# Patient Record
Sex: Female | Born: 1950 | Hispanic: Yes | Marital: Single | Smoking: Never smoker
Health system: Southern US, Community
[De-identification: ages and names within clinical notes are randomized; demographics above are authoritative.]

## PROBLEM LIST (undated history)

## (undated) DIAGNOSIS — I10 Essential (primary) hypertension: Secondary | ICD-10-CM

## (undated) DIAGNOSIS — K760 Fatty (change of) liver, not elsewhere classified: Secondary | ICD-10-CM

## (undated) DIAGNOSIS — Z853 Personal history of malignant neoplasm of breast: Secondary | ICD-10-CM

## (undated) DIAGNOSIS — K255 Chronic or unspecified gastric ulcer with perforation: Secondary | ICD-10-CM

## (undated) DIAGNOSIS — T8859XA Other complications of anesthesia, initial encounter: Secondary | ICD-10-CM

## (undated) DIAGNOSIS — Z8719 Personal history of other diseases of the digestive system: Secondary | ICD-10-CM

## (undated) DIAGNOSIS — E785 Hyperlipidemia, unspecified: Secondary | ICD-10-CM

## (undated) DIAGNOSIS — K5720 Diverticulitis of large intestine with perforation and abscess without bleeding: Secondary | ICD-10-CM

## (undated) DIAGNOSIS — K219 Gastro-esophageal reflux disease without esophagitis: Secondary | ICD-10-CM

## (undated) HISTORY — PX: MASS EXCISION: SHX2000

## (undated) HISTORY — DX: Personal history of malignant neoplasm of breast: Z85.3

## (undated) HISTORY — DX: Fatty (change of) liver, not elsewhere classified: K76.0

## (undated) HISTORY — PX: HUMERUS FRACTURE SURGERY: SHX670

## (undated) HISTORY — DX: Personal history of other diseases of the digestive system: Z87.19

## (undated) HISTORY — PX: OTHER SURGICAL HISTORY: SHX169

## (undated) HISTORY — DX: Diverticulitis of large intestine with perforation and abscess without bleeding: K57.20

## (undated) HISTORY — DX: Essential (primary) hypertension: I10

## (undated) HISTORY — DX: Hyperlipidemia, unspecified: E78.5

## (undated) HISTORY — DX: Chronic or unspecified gastric ulcer with perforation: K25.5

## (undated) HISTORY — PX: BREAST EXCISIONAL BIOPSY: SUR124

---

## 2008-10-10 DIAGNOSIS — C801 Malignant (primary) neoplasm, unspecified: Secondary | ICD-10-CM

## 2008-10-10 HISTORY — DX: Malignant (primary) neoplasm, unspecified: C80.1

## 2017-10-17 ENCOUNTER — Ambulatory Visit: Payer: Self-pay | Attending: Nurse Practitioner | Admitting: Nurse Practitioner

## 2017-10-17 ENCOUNTER — Encounter: Payer: Self-pay | Admitting: Nurse Practitioner

## 2017-10-17 VITALS — BP 136/79 | HR 77 | Temp 98.1°F | Ht 59.45 in | Wt 159.2 lb

## 2017-10-17 DIAGNOSIS — Z833 Family history of diabetes mellitus: Secondary | ICD-10-CM | POA: Insufficient documentation

## 2017-10-17 DIAGNOSIS — Z13820 Encounter for screening for osteoporosis: Secondary | ICD-10-CM

## 2017-10-17 DIAGNOSIS — E669 Obesity, unspecified: Secondary | ICD-10-CM | POA: Insufficient documentation

## 2017-10-17 DIAGNOSIS — I10 Essential (primary) hypertension: Secondary | ICD-10-CM | POA: Insufficient documentation

## 2017-10-17 DIAGNOSIS — Z6831 Body mass index (BMI) 31.0-31.9, adult: Secondary | ICD-10-CM | POA: Insufficient documentation

## 2017-10-17 DIAGNOSIS — Z79899 Other long term (current) drug therapy: Secondary | ICD-10-CM | POA: Insufficient documentation

## 2017-10-17 DIAGNOSIS — Z1211 Encounter for screening for malignant neoplasm of colon: Secondary | ICD-10-CM

## 2017-10-17 MED ORDER — LOSARTAN POTASSIUM 50 MG PO TABS: 50.0000 mg | ORAL_TABLET | Freq: Every day | ORAL | 1 refills | Status: DC

## 2017-10-17 MED FILL — LOSARTAN POTASSIUM 50 MG TA: 50 | 30 days supply | Qty: 30 | Fill #0

## 2017-10-17 NOTE — Progress Notes (Signed)
Assessment & Plan:  Maria Dominguez was seen today for new patient (initial visit).  Diagnoses and all orders for this visit:  Essential hypertension -     Basic metabolic panel -     CBC -     Lipid panel -     VITAMIN D 25 Hydroxy (Vit-D Deficiency, Fractures) -     losartan (COZAAR) 50 MG tablet; Take 1 tablet (50 mg total) by mouth daily. Continue all antihypertensives as prescribed.  Remember to bring in your blood pressure log with you for your follow up appointment.  DASH/Mediterranean Diets are healthier choices for HTN.   Colon cancer screening -     Ambulatory referral to Gastroenterology  Encounter for screening for osteoporosis -     DG Bone Density; Future  Obesity (BMI 30-39.9) Discussed diet and exercise for person with BMI >25. Instructed: You must burn more calories than you eat. Losing 5 percent of your body weight should be considered a success. In the longer term, losing more than 15 percent of your body weight and staying at this weight is an extremely good result. However, keep in mind that even losing 5 percent of your body weight leads to important health benefits, so try not to get discouraged if you're not able to lose more than this. Will recheck weight in 3-6 months.  Patient has been counseled on age-appropriate routine health concerns for screening and prevention. These are reviewed and up-to-date. Referrals have been placed accordingly. Immunizations are up-to-date or declined.    Subjective:   Chief Complaint  Patient presents with  . New Patient (Initial Visit)    Patient is here for hypertension. Patient stated she uesd to take Losartan in [place].   HPI  VRI was used to communicate directly with patient for the entire encounter including providing detailed patient instructions.  Maria Dominguez 67 y.o. female presents to office today to establish care as a new patient. She does not have much by the way of health history aside from hypertension.  She  plans to return to [place] in March and will be there for several months. She receives some of her health care in [place] as well. She declined labs today due to costs. She would like to speak to the financial services counselor prior to having labs drawn.    Essential Hypertension Blood pressure is well controlled on cozaar. She endorses medication compliance. She checks her blood pressure at home every 3-4 days. On average readings: 130/80s. She endorses drinking "lots of water and avoids excess salt and sugar in my diet.". Denies chest pain, shortness of breath, palpitations, lightheadedness, dizziness, headaches or BLE edema.    Review of Systems  Constitutional: Negative for fever, malaise/fatigue and weight loss.  HENT: Negative.  Negative for nosebleeds.   Eyes: Negative.  Negative for blurred vision, double vision and photophobia.  Respiratory: Negative.  Negative for cough and shortness of breath.   Cardiovascular: Negative.  Negative for chest pain, palpitations and leg swelling.  Gastrointestinal: Negative.  Negative for abdominal pain, constipation, diarrhea, heartburn, nausea and vomiting.  Musculoskeletal: Negative.  Negative for myalgias.  Neurological: Negative.  Negative for dizziness, focal weakness, seizures and headaches.  Endo/Heme/Allergies: Negative for environmental allergies.  Psychiatric/Behavioral: Negative.  Negative for suicidal ideas.    Past Medical History:  Diagnosis Date  . Hypertension     Past Surgical History:  Procedure Laterality Date  . HUMERUS FRACTURE SURGERY    . MASS EXCISION Left    left  axilla    Family History  Problem Relation Age of Onset  . Diabetes Mother     Social History Reviewed with no changes to be made today.   Outpatient Medications Prior to Visit  Medication Sig Dispense Refill  . losartan (COZAAR) 50 MG tablet Take 50 mg by mouth daily.     No facility-administered medications prior to visit.     Not on  File     Objective:    BP 136/79 (BP Location: Right Arm, Patient Position: Sitting, Cuff Size: Normal)   Pulse 77   Temp 98.1 F (36.7 C) (Oral)   Ht 4' 11.45" (1.51 m)   Wt 159 lb 3.2 oz (72.2 kg)   SpO2 96%   BMI 31.67 kg/m  Wt Readings from Last 3 Encounters:  10/17/17 159 lb 3.2 oz (72.2 kg)    Physical Exam  Constitutional: She is oriented to person, place, and time. She appears well-developed and well-nourished. She is cooperative.  HENT:  Head: Normocephalic and atraumatic.  Eyes: EOM are normal.  Neck: Normal range of motion.  Cardiovascular: Normal rate, regular rhythm, normal heart sounds and intact distal pulses. Exam reveals no gallop and no friction rub.  No murmur heard. Pulmonary/Chest: Effort normal and breath sounds normal. No tachypnea. No respiratory distress. She has no decreased breath sounds. She has no wheezes. She has no rhonchi. She has no rales. She exhibits no tenderness.  Abdominal: Soft. Bowel sounds are normal.  Musculoskeletal: Normal range of motion. She exhibits no edema.  Neurological: She is alert and oriented to person, place, and time. Coordination normal.  Skin: Skin is warm and dry.  Psychiatric: She has a normal mood and affect. Her behavior is normal. Judgment and thought content normal.  Nursing note and vitals reviewed.     Patient has been counseled extensively about nutrition and exercise as well as the importance of adherence with medications and regular follow-up. The patient was given clear instructions to go to ER or return to medical center if symptoms don't improve, worsen or new problems develop. The patient verbalized understanding.   Follow-up: Return in about 6 months (around 04/16/2018) for Needs appointment with financial representative.[name], FNP-BC [facility] [place], [place]   10/17/2017, 5:44 PM

## 2017-10-17 NOTE — Patient Instructions (Addendum)
Plan de alimentacin DASH DASH Eating Plan DASH es la sigla en ingls de "Enfoques Alimentarios para Detener la Hipertensin" (Dietary Approaches to Stop Hypertension). El plan de alimentacin DASH ha demostrado bajar la presin arterial elevada (hipertensin). Tambin puede reducir [employer] de diabetes tipo 2, enfermedad cardaca y accidente cerebrovascular. Este plan tambin puede ayudar a Horticulturist, commercial. Consejos para seguir este plan [place]autas generales  Evite ingerir ms de 2,300 mg (miligramos) de sal (sodio) por da. Si tiene hipertensin, es posible que necesite reducir la ingesta de sodio a 1,500 mg por da.  Limite el consumo de alcohol a no ms de 53mdida por da si es mujer y no est [place] y 259midas por da si es hombre. Una medida equivale a 12oz (35552mde cerveza, 5oz (148m27me vino o 1oz (44ml43m bebidas alcohlicas de alta graduacin.  Trabaje con su mdico para mantener un peso saludable o perder peso.[place] Mediagntele cul es el peso recomendado para usted.  Realice al menos 30 minutos de ejercicio que haga que se acelere su corazn (ejercicio aerbiArboriculturistmayorHartford Financiala semanBayside Gardensas actividades pueden incluir caminar, nadar o andar en bicicleta.  Trabaje con su mdico o especialista en alimentacin y nutricin (nutricionista) para ajustar su plan alimentario a sus necesidades calricas personales. Lectura de las etiquetas de los alimentos  Verifique en las etiquetas de los alimentos, la cantidad de sodio por porcin. Elija alimentos con menos del 5 por ciento del valor diario de sodio. Generalmente, los alimentos con menos de 300 mg de sodio por porcin se encuadran dentro de este plan alimentario.  Para encontrar cereales integrales, busque la palabra "integral" como primera palabra en la lista de ingredientes. De compras  Compre productos en los que en su etiqueta diga: "bajo contenido de sodio" o "sin agregado de sal".  Compre alimentos frescos. Evite  los alimentos enlatados y comidas precocidas o congeladas. Coccin  Evite agregar sal cuando cocine. Use hierbas o aderezos sin sal, en lugar de sal de mesa o sal marina. Consulte al mdico o farmacutico antes de usar sustitutos de la sal.  No fra los alimentos. A la hora de cocinar los alimentos opte por hornearlos, hervirlos, grillarlos y asarlos a la paAdministrator, artscine con aceites cardiosaludables, como oliva, canola, soja o girasol. Planificacin de las comidas   Consuma una dieta equilibrada, que incluya lo siguiente: ? 5o ms porciones de frutas y verduSet designerte de que la mitad del plato de cada comida sean frutas y verduras. ? Hasta 6 u 8 porciones de cereales integrales por da. ? Menos de 6 onzas de carne, aves o pescado magroGames developer porcin de 3 onzas de carne tiene casi el mismo tamao que un mazo de cartas. Un huevo equivale a 1 onza. ? Dos porciones de productos lcteos descremados por da. ?Training and development officerna porcin de frutos secos, semillas o frijoles 5 veces por semana. ? Grasas cardiosaludables. Las grasas saludables llamadas cidos grasos omega-3 se encuentran en alimentos como semillas de lino y pescados de agua fra, como por ejemplo, sardinas, salmn y caballa.  Limite la cantidad que ingiere de los siguientes alimentos: ? Alimentos enlatados o envasados. ? Alimentos con alto contenido de grasa trans, como alimentos fritos. ? Alimentos con alto contenido de grasa saturada, como carne con grasa. ? Dulces, postres, bebidas azucaradas y otros alimentos con azcar agregada. ? Productos lcteos enteros.  No le agregue sal a los alimentos antes de probarlos.  Trate de comer al  menos 2 comidas vegetarianas por semana.  Consuma ms comida casera y menos de restaurante, de bufs y comida rpida.  Cuando coma en un restaurante, pida que preparen su comida con menos sal o, en lo posible, sin nada de sal. Qu alimentos se recomiendan? Los alimentos enumerados a  continuacin no constituyen Furniture conservator/restorer. Hable con el nutricionista sobre las mejores opciones alimenticias para usted. Cereales Pan de salvado o integral. Pasta de salvado o integral. Arroz integral. Avena. Quinua. Trigo burgol. Cereales integrales y con bajo contenido de sodio. Pan pita. Galletitas de [place] con bajo contenido de [place] y [place]. Tortillas de [place] integral. Verduras Verduras frescas o congeladas (crudas, al vapor, asadas o grilladas). Jugos de tomate y verduras con bajo contenido de sodio o reducidos en sodio. Salsa y pasta de tomate con bajo contenido de sodio o reducidas en sodio. Verduras enlatadas con bajo contenido de sodio o reducidas en sodio. Frutas Todas las frutas frescas, congeladas o disecadas. Frutas enlatadas en jugo natural (sin agregado de azcar). Carne y otros alimentos proteicos Pollo o pavo sin piel. Carne de pollo o de [place]. Cerdo desgrasado. Pescado y [employer]. Claras de huevo. Porotos, guisantes o lentejas secos. Frutos secos, mantequilla de frutos secos y semillas sin sal. Frijoles enlatados sin sal. Cortes de carne vacuna magra, desgrasada. Embutidos magros, con bajo contenido de [place]. Lcteos Leche descremada (1%) o descremada. Quesos sin grasa, con bajo contenido de grasa o descremados. Queso blanco o ricota sin grasa, con bajo contenido de [place]. Yogur semidescremado o descremado. Queso con bajo contenido de [place] y [place]. Grasas y [employer] untables que no contengan grasas trans. Aceite vegetal. Lubertha Basque y aderezos para ensaladas livianos o con bajo contenido de grasas (reducidos en sodio). Aceite de canola, crtamo, oliva, soja y [place]. [place]. Condimentos y otros alimentos Hierbas. Especias. Mezclas de condimentos sin sal. Palomitas de maz y pretzels sin sal. Dulces con bajo contenido de grasas. Qu alimentos no se recomiendan? Los alimentos enumerados a continuacin no constituyen Furniture conservator/restorer. Hable con el  nutricionista sobre las mejores opciones alimenticias para usted. Cereales Productos de panificacin hechos con grasa, como medialunas, magdalenas y algunos panes. Comidas con arroz o pasta seca listas para usar. Verduras Verduras con crema o fritas. Verduras en [place]. Verduras enlatadas regulares (que no sean con bajo contenido de sodio o reducidas en sodio). Pasta y salsa de tomates enlatadas regulares (que no sean con bajo contenido de sodio o reducidas en sodio). Jugos de tomate y verduras regulares (que no sean con bajo contenido de sodio o reducidos en sodio). Pepinillos. Aceitunas. Lambert Mody Fruta enlatada en almbar liviano o espeso. Frutas cocidas en aceite. Frutas con salsa de crema o [place]. Carne y otros alimentos proteicos Cortes de carne con grasa. Costillas. Carne frita. [place]. Salchichas. Mortadela y otras carnes procesadas. Salame. Panceta. Perros calientes (hotdogs). [place]. Frutos secos y semillas con sal. Frijoles enlatados con agregado de sal. Pescado enlatado o ahumado. Huevos enteros o yemas. Pollo o pavo con piel. Lcteos Leche entera o al 2%, crema y mitad leche y mitad crema. Queso crema entero o con toda su grasa. Yogur entero o endulzado. Quesos con toda su grasa. Sustitutos de cremas no lcteas. Coberturas batidas. Quesos para untar y quesos procesados. Grasas y [employer]. Margarina en barra. [place]. Materia grasa. Mantequilla clarificada. Grasa de panceta. Aceites tropicales como aceite de coco, palmiste o palma. Condimentos y otros alimentos Palomitas de maz y pretzels con sal. Sal de  cebolla, sal de ajo, sal condimentada, sal de mesa y sal marina. Salsa [place]. Salsa trtara. Salsa barbacoa. Salsa teriyaki. Salsa de soja, incluso la que tiene contenido reducido de [place]. Salsa de carne. Salsas en lata y envasadas. Salsa de pescado. Salsa de [place]. Salsa rosada. Rbano picante envasado. [place]. Mostaza. Saborizantes y  tiernizantes para carne. Caldo en cubitos. Salsa picante y salsa tabasco. Escabeches envasados o ya preparados. Aderezos para tacos prefabricados o envasados. Salsas. Aderezos comunes para ensalada. Dnde encontrar ms informacin:  [place], los Pulmones y Herbalist (National Heart, Lung, and [place]): https://wilson-eaton.com/  Asociacin Estadounidense del Corazn (American Heart Association): www.heart.org Resumen  El plan de alimentacin DASH ha demostrado bajar la presin arterial elevada (hipertensin). Tambin puede reducir UnitedHealth de diabetes tipo 2, enfermedad cardaca y accidente cerebrovascular.  Con el plan de alimentacin DASH, deber limitar el consumo de sal (sodio) a 2,300 mg por da. Si tiene hipertensin, es posible que necesite reducir la ingesta de sodio a 1,500 mg por da.  Cuando siga el plan de alimentacin DASH, trate de comer ms frutas frescas y verduras, cereales integrales, carnes magras, lcteos descremados y grasas cardiosaludables.  Trabaje con su mdico o especialista en alimentacin y nutricin (nutricionista) para ajustar su plan alimentario a sus necesidades calricas personales. Esta informacin no tiene Marine scientist el consejo del mdico. Asegrese de hacerle al mdico cualquier pregunta que tenga. Document Released: 09/15/2011 Document Revised: 01/16/2017 Document Reviewed: 01/16/2017 Elsevier Interactive Patient Education  2018 [employer].  Hipertensin Hypertension El trmino hipertensin es otra forma de denominar a la presin arterial elevada. La presin arterial elevada fuerza al corazn a trabajar ms para bombear la sangre. Esto puede causar problemas con el paso del [place]. Una lectura de presin arterial est compuesta por 2 nmeros. Hay un nmero superior (sistlico) sobre un nmero inferior (diastlico). Lo ideal es tener la presin arterial por debajo de 120/80. Las decisiones saludables pueden ayudarle a  disminuir su presin arterial. Es posible que necesite medicamentos que le ayuden a disminuir su presin arterial si:  Su presin arterial no disminuye mediante decisiones saludables.  Su presin arterial est por encima de 130/80.  Siga estas instrucciones en su casa: Comida y bebida  Si se lo indican, siga el plan de alimentacin de DASH (Dietary Approaches to Stop Hypertension, Maneras de alimentarse para detener la hipertensin). Esta dieta incluye: ? Que la mitad del plato de cada comida sea de frutas y verduras. ? Que un cuarto del plato de cada comida sea de cereales integrales. Los cereales integrales incluyen pasta integral, arroz integral y pan integral. ? Comer y beber productos lcteos con bajo contenido de [place], como leche descremada o yogur bajo en grasas. ? Que un cuarto del plato de cada comida sea de protenas bajas en grasa (magras). Las protenas bajas en grasa incluyen pescado, pollo sin piel, huevos, frijoles y tofu. ? Evitar consumir carne grasa, carne curada y procesada, o pollo con piel. ? Evitar consumir alimentos prehechos o procesados.  Consuma menos de 1500 mg de sal (sodio) por da.  Limite el consumo de alcohol a no ms de 1 medida por da si es mujer y no est Music therapist y a 2 medidas por da si es hombre. Una medida equivale a 12onzas de cerveza, 5onzas de vino o 1onzas de bebidas alcohlicas de alta graduacin. Estilo de vida  Trabaje con su mdico para mantenerse en un peso saludable o para perder peso. Pregntele a su mdico cul  es el peso recomendable para usted.  Realice al menos 30 minutos de ejercicio que haga que se acelere su corazn (ejercicio Arboriculturist) la [employer] de la [place]. Estos pueden incluir caminar, nadar o andar en bicicleta.  Realice al menos 30 minutos de ejercicio que fortalezca sus msculos (ejercicios de resistencia) al menos 3 das a la [place]. Estos pueden incluir levantar pesas o hacer pilates.  No consuma ningn  producto que contenga nicotina o tabaco. Esto incluye cigarrillos y cigarrillos electrnicos. Si necesita ayuda para dejar de fumar, consulte al [employer].  Controle su presin arterial en su casa tal como le indic el mdico.  Concurra a todas las visitas de control como se lo haya indicado el mdico. Esto es importante. Medicamentos  [employer] de venta libre y los recetados solamente como se lo haya indicado el mdico. Siga cuidadosamente las indicaciones.  No omita las dosis de medicamentos para la presin arterial. Los medicamentos pierden eficacia si omite dosis. El hecho de omitir las dosis tambin [place] el riesgo de otros problemas.  Pregntele a su mdico a qu efectos secundarios o reacciones a los Careers information officer. Comunquese con un mdico si:  Piensa que tiene [place] reaccin a los medicamentos que est tomando.  Tiene dolores de cabeza frecuentes (recurrentes).  Siente mareos.  Tiene hinchazn en los tobillos.  Tiene problemas de visin. Solicite ayuda de inmediato si:  Siente un dolor de cabeza muy intenso.  Comienza a sentirse confundido.  Se siente dbil o adormecido.  Siente que va a desmayarse.  Siente un dolor muy intenso en: ? El pecho. ? El vientre (abdomen).  Devuelve (vomita) ms de una vez.  Tiene dificultad para respirar. Resumen  El trmino hipertensin es otra forma de denominar a la presin arterial elevada.  Las decisiones saludables pueden ayudarle a disminuir su presin arterial. Si no puede controlar su presin arterial mediante decisiones saludables, es posible que deba tomar medicamentos. Esta informacin no tiene Marine scientist el consejo del mdico. Asegrese de hacerle al mdico cualquier pregunta que tenga. Document Released: 03/16/2010 Document Revised: 09/07/2016 Document Reviewed: 09/07/2016 Elsevier Interactive Patient Education  [employer].

## 2017-12-21 ENCOUNTER — Other Ambulatory Visit: Payer: Self-pay | Admitting: Nurse Practitioner

## 2017-12-21 DIAGNOSIS — Z780 Asymptomatic menopausal state: Secondary | ICD-10-CM

## 2018-01-30 ENCOUNTER — Telehealth: Payer: Self-pay

## 2018-01-30 NOTE — Telephone Encounter (Signed)
CMA call because the Breast center is trying to reach her out to set up an appointment   Patient phone number is not in service

## 2018-01-30 NOTE — Telephone Encounter (Signed)
Noted. Thank you. Please reach out to patient as well and leave message if unavailable. Thank you

## 2018-12-18 ENCOUNTER — Ambulatory Visit: Payer: Self-pay | Attending: Internal Medicine | Admitting: Internal Medicine

## 2018-12-18 ENCOUNTER — Other Ambulatory Visit: Payer: Self-pay

## 2018-12-18 ENCOUNTER — Encounter: Payer: Self-pay | Admitting: Internal Medicine

## 2018-12-18 VITALS — BP 170/87 | HR 71 | Temp 98.8°F | Resp 19 | Ht 62.0 in | Wt 160.2 lb

## 2018-12-18 DIAGNOSIS — Z853 Personal history of malignant neoplasm of breast: Secondary | ICD-10-CM

## 2018-12-18 DIAGNOSIS — I10 Essential (primary) hypertension: Secondary | ICD-10-CM

## 2018-12-18 DIAGNOSIS — L309 Dermatitis, unspecified: Secondary | ICD-10-CM

## 2018-12-18 MED ORDER — LOSARTAN POTASSIUM 50 MG PO TABS: 50.0000 mg | ORAL_TABLET | Freq: Every day | ORAL | 0 refills | Status: DC

## 2018-12-18 MED FILL — LOSARTAN POTASSIUM 50 MG TA: 50 | 30 days supply | Qty: 30 | Fill #0

## 2018-12-18 NOTE — Progress Notes (Signed)
Patient arrived ambulatory, alert and orientated to clinic.  Patient is in clinic for hypertension check.  Patient had 170/87 in clinic.  Patient has been inconsistent with medications.  Patient complaining of 8/10 pain located right buttocks were she fell two months ago.. Pain is ntermittent.  Patient also has a complaint of right nipple rash.  Patient has not change brands of body soap but has change fragrance.  Patient does have a history of right breast cancer.

## 2018-12-18 NOTE — Progress Notes (Signed)
F/u htn, ran out of bp med 1 month ago. Labs done 1 yr ago. Denies h/a, c/p, sob, fatigue, n/v. Spot on right nipple x 1 week, itchy, no palp lumps. Hx breast ca on left breast 7 yrs ago,lumpectomy, chemo, rx tamoxifen. Last mammo 2 yrs ago. Reports pain in buttocks following fall 2 yrs ago, sx intermittent  Patient Active Problem List   Diagnosis Date Noted  . essential hypertension 10/17/2017    Past Medical History:  Diagnosis Date  . Hx of breast cancer   . Hx of gastric ulcer   . Hypertension     Past Surgical History:  Procedure Laterality Date  . HUMERUS FRACTURE SURGERY    . left lumpectomy    . MASS EXCISION Left    left axilla    Meds: none (ran out of losartan) NKDA  Female in NAD BP (!) 170/87 (BP Location: Right Arm, Patient Position: Sitting, Cuff Size: Normal)   Pulse 71   Temp 98.8 F (37.1 C) (Oral)   Resp 19   Ht 5\' 2"  (1.575 m)   Wt 72.7 kg   SpO2 98%   BMI 29.30 kg/m  CV: rrr, s1s2 Lungs: cta Breasts: no palp masses, no d/c, r areola with hyperpigmented patch inner upper quad Abd: soft, nt, no hsm A/p 1. Essential hypertension Uncontrolled due to noncompliance with meds, rx refilled, proceed with labs, rv 2 weeks  - losartan (COZAAR) 50 MG tablet; Take 1 tablet (50 mg total) by mouth daily.  Dispense: 30 tablet; Refill: 0 - Basic metabolic panel  2. Nipple dermatitis hc cream 1% bid, proceed w mammo, if nb will need bx - MM Digital Diagnostic Bilat - Ambulatory referral to General Surgery  3. Hx of breast cancer - MM Digital Diagnostic Bilat  Advised to return for eval of buttock pain - Ambulatory referral to General Surgery

## 2018-12-18 NOTE — Patient Instructions (Addendum)
Apply hydrocortisone cream 1% right nipple x 2 weeks       Hipertensin Hypertension Introduccin El trmino hipertensin es otra forma de denominar a la presin arterial elevada. La presin arterial elevada fuerza al corazn a trabajar ms para bombear la sangre. Esto puede causar problemas con el paso del [place]. Una lectura de presin arterial est compuesta por 2 nmeros. Hay un nmero superior (sistlico) sobre un nmero inferior (diastlico). Lo ideal es tener la presin arterial por debajo de 120/80. Las decisiones saludables pueden ayudarle a disminuir su presin arterial. Es posible que necesite medicamentos que le ayuden a disminuir su presin arterial si:  Su presin arterial no disminuye mediante decisiones saludables.  Su presin arterial est por encima de 130/80. Siga estas instrucciones en su casa: Comida y bebida   Si se lo indican, siga el plan de alimentacin de DASH (Dietary Approaches to Stop Hypertension, Maneras de alimentarse para detener la hipertensin). Esta dieta incluye: ? Que la mitad del plato de cada comida sea de frutas y verduras. ? Que un cuarto del plato de cada comida sea de cereales integrales. Los cereales integrales incluyen pasta integral, arroz integral y pan integral. ? Comer y beber productos lcteos con bajo contenido de [place], como leche descremada o yogur bajo en grasas. ? Que un cuarto del plato de cada comida sea de protenas bajas en grasa (magras). Las protenas bajas en grasa incluyen pescado, pollo sin piel, huevos, frijoles y tofu. ? Evitar consumir carne grasa, carne curada y procesada, o pollo con piel. ? Evitar consumir alimentos prehechos o procesados.  Consuma menos de 1500 mg de sal (sodio) por da.  Limite el consumo de alcohol a no ms de 1 medida por da si es mujer y no est Music therapist y a 2 medidas por da si es hombre. Una medida equivale a 12onzas de cerveza, 5onzas de vino o 1onzas de bebidas alcohlicas de alta  graduacin. Estilo de vida  Trabaje con su mdico para mantenerse en un peso saludable o para perder peso. Pregntele a su mdico cul es el peso recomendable para usted.  Realice al menos 30 minutos de ejercicio que haga que se acelere su corazn (ejercicio Arboriculturist) la [employer] de la [place]. Estos pueden incluir caminar, nadar o andar en bicicleta.  Realice al menos 30 minutos de ejercicio que fortalezca sus msculos (ejercicios de resistencia) al menos 3 das a la [place]. Estos pueden incluir levantar pesas o hacer pilates.  No consuma ningn producto que contenga nicotina o tabaco. Esto incluye cigarrillos y cigarrillos electrnicos. Si necesita ayuda para dejar de fumar, consulte al [employer].  Controle su presin arterial en su casa tal como le indic el mdico.  Concurra a todas las visitas de control como se lo haya indicado el mdico. Esto es importante. Medicamentos  [employer] de venta libre y los recetados solamente como se lo haya indicado el mdico. Siga cuidadosamente las indicaciones.  No omita las dosis de medicamentos para la presin arterial. Los medicamentos pierden eficacia si omite dosis. El hecho de omitir las dosis tambin [place] el riesgo de otros problemas.  Pregntele a su mdico a qu efectos secundarios o reacciones a los Careers information officer. Comunquese con un mdico si:  Piensa que tiene [place] reaccin a los medicamentos que est tomando.  Tiene dolores de cabeza frecuentes (recurrentes).  Siente mareos.  Tiene hinchazn en los tobillos.  Tiene problemas de visin. Solicite ayuda de inmediato si:  Technical brewer  de cabeza muy intenso.  Comienza a sentirse confundido.  Se siente dbil o adormecido.  Siente que va a desmayarse.  Siente un dolor muy intenso en: ? El pecho. ? El vientre (abdomen).  Devuelve (vomita) ms de una vez.  Tiene dificultad para respirar. Resumen  El trmino hipertensin es otra  forma de denominar a la presin arterial elevada.  Las decisiones saludables pueden ayudarle a disminuir su presin arterial. Si no puede controlar su presin arterial mediante decisiones saludables, es posible que deba tomar medicamentos. Esta informacin no tiene Marine scientist el consejo del mdico. Asegrese de hacerle al mdico cualquier pregunta que tenga. Document Released: 03/16/2010 Document Revised: 09/07/2016 Document Reviewed: 09/07/2016 Elsevier Interactive Patient Education  2019 [employer].

## 2018-12-19 LAB — BASIC METABOLIC PANEL
BUN/Creatinine Ratio: 23 (ref 12–28)
BUN: 15 mg/dL (ref 8–27)
CO2: 25 mmol/L (ref 20–29)
Calcium: 9.4 mg/dL (ref 8.7–10.3)
Chloride: 100 mmol/L (ref 96–106)
Creatinine, Ser: 0.66 mg/dL (ref 0.57–1.00)
GFR calc Af Amer: 105 mL/min/{1.73_m2} (ref >59)
GFR, EST NON AFRICAN AMERICAN: 91 mL/min/{1.73_m2} (ref >59)
Glucose: 90 mg/dL (ref 65–99)
Potassium: 4.3 mmol/L (ref 3.5–5.2)
Sodium: 138 mmol/L (ref 134–144)

## 2019-01-15 ENCOUNTER — Ambulatory Visit: Payer: Self-pay | Admitting: Nurse Practitioner

## 2019-01-15 ENCOUNTER — Encounter: Payer: Self-pay | Admitting: Nurse Practitioner

## 2019-01-15 ENCOUNTER — Ambulatory Visit: Payer: Self-pay | Attending: Nurse Practitioner | Admitting: Nurse Practitioner

## 2019-01-15 ENCOUNTER — Other Ambulatory Visit: Payer: Self-pay

## 2019-01-15 VITALS — BP 167/69 | HR 66 | Temp 99.4°F | Ht 62.0 in | Wt 162.8 lb

## 2019-01-15 DIAGNOSIS — I10 Essential (primary) hypertension: Secondary | ICD-10-CM

## 2019-01-15 DIAGNOSIS — Z1159 Encounter for screening for other viral diseases: Secondary | ICD-10-CM

## 2019-01-15 DIAGNOSIS — F5101 Primary insomnia: Secondary | ICD-10-CM

## 2019-01-15 DIAGNOSIS — Z1211 Encounter for screening for malignant neoplasm of colon: Secondary | ICD-10-CM

## 2019-01-15 MED ORDER — LOSARTAN POTASSIUM 50 MG PO TABS: 50.0000 mg | ORAL_TABLET | Freq: Every day | ORAL | 1 refills | Status: DC

## 2019-01-15 MED ORDER — TRAZODONE HCL 50 MG PO TABS: 25.0000 mg | ORAL_TABLET | Freq: Every evening | ORAL | 3 refills | Status: DC | PRN

## 2019-01-15 MED FILL — LOSARTAN POTASSIUM 50 MG TA: 50 | 30 days supply | Qty: 30 | Fill #0

## 2019-01-15 MED FILL — traZODone HCL 50 MG TABS: 50 | 30 days supply | Qty: 30 | Fill #0

## 2019-01-15 NOTE — Patient Instructions (Signed)
Insomnio  Insomnia  El insomnio es un trastorno del sueo que causa dificultades para conciliar el sueo o para mantenerlo. Puede producir fatiga, falta de energa, dificultad para concentrarse, cambios en el estado de nimo y mal rendimiento escolar o laboral.  Hay tres formas diferentes de clasificar el insomnio:   Dificultad para conciliar el sueo.   Dificultad para mantener el sueo.   Despertar muy precoz por la maana.  Cualquier tipo de insomnio puede ser a largo plazo (crnico) o a corto plazo (agudo). Ambos son frecuentes. Generalmente, el insomnio a corto plazo dura tres meses o menos tiempo. El crnico ocurre al menos tres veces por semana durante ms de tres meses.  Cules son las causas?  El insomnio puede deberse a otra afeccin, situacin o sustancia, por ejemplo:   Ansiedad.   Ciertos medicamentos.   Enfermedad de reflujo gastroesofgico (ERGE) u otras enfermedades gastrointestinales.   Asma y otras enfermedades respiratorias.   Sndrome de las piernas inquietas, apnea del sueo u otros trastornos del sueo.   Dolor crnico.   Menopausia.   Accidente cerebrovascular.   Consumo excesivo de alcohol, tabaco u drogas ilegales.   Afecciones de salud mental, como depresin.   Cafena.   Trastornos neurolgicos, como enfermedad de Alzheimer.   Hiperactividad de la glndula tiroidea (hipertiroidismo).  En ocasiones, la causa del insomnio puede ser desconocida.  Qu incrementa el riesgo?  Los factores de riesgo de tener insomnio incluyen lo siguiente:   Sexo. La enfermedad afecta ms a menudo a las mujeres que a los hombres.   Edad. El insomnio es ms frecuente a medida que una persona envejece.   Estrs.   La falta de actividad fsica.   Los horarios de trabajo irregulares o los turnos nocturnos.   Los viajes a lugares de diferentes zonas horarias.   Ciertas afecciones mdicas y de salud mental.  Cules son los signos o los sntomas?  Si tiene insomnio, el sntoma principal es la  dificultad para conciliar el sueo o mantenerlo. Esto puede derivar en otros sntomas, por ejemplo:   Sentirse fatigado o tener poca energa.   Ponerse nervioso por tener que irse a dormir.   No sentirse descansado por la maana.   Tener dificultad para concentrarse.   Sentirse irritable, ansioso o deprimido.  Cmo se diagnostica?  Esta afeccin se puede diagnosticar en funcin de lo siguiente:   Los sntomas y antecedentes mdicos. El mdico puede hacerle preguntas sobre:  ? Hbitos de sueo.  ? Cualquier afeccin mdica que tenga.  ? La salud mental.   Un examen fsico.  Cmo se trata?  El tratamiento para el insomnio depende de la causa. El tratamiento puede centrarse en tratar una afeccin preexistente que causa el insomnio. El tratamiento tambin puede incluir lo siguiente:   Medicamentos que lo ayuden a dormir.   Asesoramiento psicolgico o terapia.   Ajustes en el estilo de vida para ayudarlo a dormir mejor.  Siga estas indicaciones en su casa:  Comida y bebida     Limite o evite el consumo de alcohol, bebidas con cafena y cigarrillos, especialmente cerca de la hora de acostarse, ya que pueden perturbarle el sueo.   No consuma una comida suculenta ni coma alimentos condimentados justo antes de la hora de acostarse. Esto puede causarle molestias digestivas y dificultades para dormir.  Hbitos de sueo     Lleve un registro del sueo ya que podra ser de utilidad para que usted y a su mdico puedan determinar qu podra   estar causndole insomnio. Escriba los siguientes datos:  ? Cundo duerme.  ? Cundo se despierta durante la noche.  ? Qu tan bien duerme.  ? Qu tan relajado se siente al da siguiente.  ? Cualquier efecto secundario de los medicamentos que toma.  ? Lo que usted come y bebe.   Convierta su habitacin en un lugar oscuro, cmodo donde sea fcil conciliar el sueo.  ? Coloque persianas o cortinas oscuras que impidan la entrada de la luz del exterior.  ? Para bloquear los ruidos,  use un aparato que reproduzca sonidos ambientales o relajantes de fondo.  ? Mantenga baja la temperatura.   Limite el uso de pantallas antes de la hora de acostarse. Esto incluye lo siguiente:  ? Mirar televisin.  ? Usar el telfono inteligente, la tableta o la computadora.   Siga una rutina que incluya ir a dormir y despertarse a la misma hora cada da y noche. Esto puede ayudarlo a conciliar el sueo ms rpidamente. Considere realizar una actividad tranquila, como leer, e incorporarla como parte de la rutina a la hora de irse a dormir.   Trate de evitar tomar siestas durante el da para que pueda dormir mejor por la noche.   Levntese de la cama si sigue despierto despus de 15minutos de haber intentado dormirse. Mantenga bajas las luces, pero intente leer o hacer una actividad tranquila. Cuando tenga sueo, regrese a la cama.  Instrucciones generales   Tome los medicamentos de venta libre y los recetados solamente como se lo haya indicado el mdico.   Realice ejercicio con regularidad como se lo haya indicado el mdico. Evite la actividad fsica desde varias horas antes de irse a dormir.   Utilice tcnicas de relajacin para controlar el estrs. Pdale al mdico que le sugiera algunas tcnicas que sean adecuadas para usted. Estos pueden incluir lo siguiente:  ? Ejercicios de respiracin.  ? Rutinas para aliviar la tensin muscular.  ? Visualizacin de escenas apacibles.   Conduzca con cuidado. No conduzca si est muy somnoliento.   Concurra a todas las visitas de control como se lo haya indicado el mdico. Esto es importante.  Comunquese con un mdico si:   Est cansado durante todo el da.   Tiene dificultad en su rutina diaria debido a la somnolencia.   Sigue teniendo problemas para dormir o estos empeoran.  Solicite ayuda de inmediato si:   Piensa seriamente en lastimarse a usted mismo o a otra persona.  Si alguna vez siente que puede lastimarse a usted mismo o a otras personas, o tiene  pensamientos de poner fin a su vida, busque ayuda de inmediato. Puede dirigirse al servicio de emergencias ms cercano o comunicarse con:   El servicio de emergencias de su localidad (911 en [place]   Una lnea de asistencia al suicida y atencin en crisis, como la Lnea Nacional de Prevencin del Suicidio (National Suicide Prevention Lifeline), al 1-800-273-8255. Est disponible las 24 horas del da.  Resumen   El insomnio es un trastorno del sueo que causa dificultades para conciliar el sueo o para mantenerlo.   El insomnio puede ser a largo plazo (crnico) o a corto plazo (agudo).   El tratamiento para el insomnio depende de la causa. El tratamiento puede centrarse en tratar una afeccin preexistente que causa el insomnio.   Lleve un registro del sueo ya que podra ser de utilidad para que usted y a su mdico puedan determinar qu podra estar causndole insomnio.  Esta informacin no tiene   como fin reemplazar el consejo del mdico. Asegrese de hacerle al mdico cualquier pregunta que tenga.  Document Released: 09/26/2005 Document Revised: 09/15/2017 Document Reviewed: 09/15/2017  Elsevier Interactive Patient Education  2019 Elsevier Inc.

## 2019-01-15 NOTE — Progress Notes (Signed)
Assessment & Plan:  [name] was seen today for follow-up.  Diagnoses and all orders for this visit:  Essential hypertension -     losartan (COZAAR) 50 MG tablet; Take 1 tablet (50 mg total) by mouth daily. -     CBC -     VITAMIN D 25 Hydroxy (Vit-D Deficiency, Fractures) -     Lipid panel Continue all antihypertensives as prescribed.  Remember to bring in your blood pressure log with you for your follow up appointment.  DASH/Mediterranean Diets are healthier choices for HTN.    Colon cancer screening -     Fecal occult blood, imunochemical  Encounter for hepatitis C screening test for low risk patient -     Hepatitis C Antibody  Primary insomnia -     traZODone (DESYREL) 50 MG tablet; Take 0.5-1 tablets (25-50 mg total) by mouth at bedtime as needed for sleep.    Patient has been counseled on age-appropriate routine health concerns for screening and prevention. These are reviewed and up-to-date. Referrals have been placed accordingly. Immunizations are up-to-date or declined.    Subjective:   Chief Complaint  Patient presents with  . Follow-up    Pt. is here for Hypertension follow-up.    HPI Maria Dominguez 68 y.o. female presents to office for follow up visit for HTN and nipple dermatitis.    Nipple Dermatitis She was given a cream for nipple dermatitis at her last office visit with another provider on 12-18-2018. Today she reports the skin rash has resolved however now the skin is extremely dry around the nipple. I have instructed her to stop using the cream at this time. She endorses pain around the right areola. Denies any masses or lumps.  Referred to [facility] today for diagnostic mammogram. She does have a history of breast cancer of the left breast and is overdue for mammogram   Essential Hypertension Chronic and not well controlled today. She does monitor her blood pressure at home regularly with average readings 130/70-80s.  She did not take her losartan 50mg    this morning. States she normally takes it in the evenings. I have instructed her to take her medication in the mornings as she endorses increased nocturia when she takes her losartan in the evenings. Denies chest pain, shortness of breath, palpitations, lightheadedness, dizziness, headaches or BLE edema.  BP Readings from Last 3 Encounters:  01/15/19 (!) 167/69  12/18/18 (!) 170/87  10/17/17 136/79   Primary Insomnia Endorses difficulty falling asleep at night. Does not drink any caffeine late in the evenings and tries to follow healthy sleep routine.     Review of Systems  Constitutional: Negative for fever, malaise/fatigue and weight loss.  HENT: Negative.  Negative for nosebleeds.   Eyes: Negative.  Negative for blurred vision, double vision and photophobia.  Respiratory: Negative.  Negative for cough and shortness of breath.   Cardiovascular: Negative.  Negative for chest pain, palpitations and leg swelling.  Gastrointestinal: Negative.  Negative for heartburn, nausea and vomiting.  Musculoskeletal: Negative.  Negative for myalgias.  Neurological: Negative.  Negative for dizziness, focal weakness, seizures and headaches.  Psychiatric/Behavioral: Negative for suicidal ideas. The patient has insomnia.     Past Medical History:  Diagnosis Date  . Hx of breast cancer   . Hx of gastric ulcer   . Hypertension     Past Surgical History:  Procedure Laterality Date  . HUMERUS FRACTURE SURGERY    . left lumpectomy    .  MASS EXCISION Left    left axilla    Family History  Problem Relation Age of Onset  . Diabetes Mother     Social History Reviewed with no changes to be made today.   Outpatient Medications Prior to Visit  Medication Sig Dispense Refill  . losartan (COZAAR) 50 MG tablet Take 1 tablet (50 mg total) by mouth daily. 30 tablet 0   No facility-administered medications prior to visit.     No Known Allergies     Objective:    BP (!) 167/69 (BP Location:  Right Arm, Patient Position: Sitting, Cuff Size: Normal)   Pulse 66   Temp 99.4 F (37.4 C) (Oral)   Ht 5\' 2"  (1.575 m)   Wt 162 lb 12.8 oz (73.8 kg)   SpO2 98%   BMI 29.78 kg/m  Wt Readings from Last 3 Encounters:  01/15/19 162 lb 12.8 oz (73.8 kg)  12/18/18 160 lb 3.2 oz (72.7 kg)  10/17/17 159 lb 3.2 oz (72.2 kg)    Physical Exam Vitals signs and nursing note reviewed.  Constitutional:      Appearance: She is well-developed.  HENT:     Head: Normocephalic and atraumatic.  Neck:     Musculoskeletal: Normal range of motion.  Cardiovascular:     Rate and Rhythm: Normal rate and regular rhythm.     Heart sounds: Normal heart sounds. No murmur. No friction rub. No gallop.   Pulmonary:     Effort: Pulmonary effort is normal. No tachypnea or respiratory distress.     Breath sounds: Normal breath sounds. No decreased breath sounds, wheezing, rhonchi or rales.  Chest:     Chest wall: No mass, lacerations, deformity, swelling, crepitus or edema. There is no dullness to percussion.     Breasts:        Right: Normal. No swelling, bleeding, inverted nipple, mass, nipple discharge, skin change or tenderness.        Left: Normal. No swelling, bleeding, inverted nipple, mass, nipple discharge, skin change or tenderness.  Abdominal:     General: Bowel sounds are normal.     Palpations: Abdomen is soft.  Musculoskeletal: Normal range of motion.  Skin:    General: Skin is warm and dry.  Neurological:     Mental Status: She is alert and oriented to person, place, and time.     Coordination: Coordination normal.  Psychiatric:        Behavior: Behavior normal. Behavior is cooperative.        Thought Content: Thought content normal.        Judgment: Judgment normal.          Patient has been counseled extensively about nutrition and exercise as well as the importance of adherence with medications and regular follow-up. The patient was given clear instructions to go to ER or return to  medical center if symptoms don't improve, worsen or new problems develop. The patient verbalized understanding.   Follow-up: Return in about 3 months (around 04/16/2019) for BP recheck.   [name], FNP-BC [facility], [place]   01/15/2019, 5:46 PM

## 2019-01-16 ENCOUNTER — Other Ambulatory Visit: Payer: Self-pay | Admitting: Nurse Practitioner

## 2019-01-16 LAB — VITAMIN D 25 HYDROXY (VIT D DEFICIENCY, FRACTURES): Vit D, 25-Hydroxy: 16.5 ng/mL — ABNORMAL LOW (ref 30.0–100.0)

## 2019-01-16 LAB — LIPID PANEL
Chol/HDL Ratio: 5.5 ratio — ABNORMAL HIGH (ref 0.0–4.4)
Cholesterol, Total: 169 mg/dL (ref 100–199)
HDL: 31 mg/dL — ABNORMAL LOW (ref >39)
Triglycerides: 457 mg/dL — ABNORMAL HIGH (ref 0–149)

## 2019-01-16 LAB — CBC
Hematocrit: 37.8 % (ref 34.0–46.6)
Hemoglobin: 11.8 g/dL (ref 11.1–15.9)
MCH: 25.5 pg — ABNORMAL LOW (ref 26.6–33.0)
MCHC: 31.2 g/dL — ABNORMAL LOW (ref 31.5–35.7)
MCV: 82 fL (ref 79–97)
Platelets: 373 10*3/uL (ref 150–450)
RBC: 4.63 x10E6/uL (ref 3.77–5.28)
RDW: 13.5 % (ref 11.7–15.4)
WBC: 5.5 10*3/uL (ref 3.4–10.8)

## 2019-01-16 LAB — HEPATITIS C ANTIBODY: Hep C Virus Ab: <0.1 s/co ratio (ref 0.0–0.9)

## 2019-01-16 MED ORDER — ROSUVASTATIN CALCIUM 10 MG PO TABS: 10.0000 mg | ORAL_TABLET | Freq: Every day | ORAL | 3 refills | Status: DC

## 2019-01-16 MED ORDER — VITAMIN D (ERGOCALCIFEROL) 1.25 MG (50000 UNIT) PO CAPS: 50000.0000 [IU] | ORAL_CAPSULE | ORAL | 1 refills | Status: DC

## 2019-01-17 ENCOUNTER — Telehealth: Payer: Self-pay

## 2019-01-17 MED FILL — ROSUVASTATIN CALCIUM 10 MG: 10 | 30 days supply | Qty: 30 | Fill #0

## 2019-01-17 MED FILL — VIT D2 1.25 MG (50,000 UNIT: 1.25 MG | 84 days supply | Qty: 12 | Fill #0

## 2019-01-17 NOTE — Telephone Encounter (Signed)
CMA spoke to patient to inform on lab results, PCP advising, and Rx.  Pt. Understood. Pt. Verified DOB.   [place] assist with the call.

## 2019-01-17 NOTE — Telephone Encounter (Signed)
-----   Message from [name], NP sent at 01/16/2019  8:49 AM EDT ----- Labs do not show anemia. Vitamin D is low. Will send prescription to pharmacy for weekly vitamin D for the next 3 months. Cholesterol levels are very elevated. Specifically your triglycerides. Based on a calculation of your cholesterol you have a 22% chance of having a heart attack or stroke in the next 10 years. I have started you on cholesterol medication. It will be sent to the pharmacy as well as your vitamin D.

## 2019-01-22 ENCOUNTER — Telehealth (HOSPITAL_COMMUNITY): Payer: Self-pay

## 2019-01-22 NOTE — Telephone Encounter (Signed)
Left message with patient via pacific interpreters that we received a referral from her doctor to get her scheduled for a mammogram. Left name and number for patient to call back.

## 2019-01-27 LAB — FECAL OCCULT BLOOD, IMMUNOCHEMICAL: Fecal Occult Bld: NEGATIVE

## 2019-02-01 ENCOUNTER — Telehealth: Payer: Self-pay

## 2019-02-01 NOTE — Telephone Encounter (Signed)
CMA attempt to reach patient to inform on results.  No answer and left a VM.  

## 2019-02-01 NOTE — Telephone Encounter (Signed)
-----   Message from [name], NP sent at 01/28/2019 10:21 AM EDT ----- Stool study negative for blood. Will repeat next year.

## 2019-02-15 ENCOUNTER — Other Ambulatory Visit: Payer: Self-pay | Admitting: Nurse Practitioner

## 2019-02-15 ENCOUNTER — Other Ambulatory Visit: Payer: Self-pay

## 2019-02-15 DIAGNOSIS — T452X1A Poisoning by vitamins, accidental (unintentional), initial encounter: Secondary | ICD-10-CM

## 2019-02-15 MED FILL — LOSARTAN POTASSIUM 50 MG TA: 50 | 30 days supply | Qty: 30 | Fill #1

## 2019-02-16 LAB — VITAMIN D 25 HYDROXY (VIT D DEFICIENCY, FRACTURES): Vit D, 25-Hydroxy: 42.4 ng/mL (ref 30.0–100.0)

## 2019-02-16 LAB — CALCIUM: Calcium: 9.2 mg/dL (ref 8.7–10.3)

## 2019-02-19 ENCOUNTER — Telehealth (HOSPITAL_COMMUNITY): Payer: Self-pay

## 2019-02-19 NOTE — Telephone Encounter (Signed)
Left message with patient via interpreter Rudene Anda letting her know that we received a referral from her primary care doctor get her scheduled with our [facility] program. Left name and number for her to call back.

## 2019-02-26 ENCOUNTER — Telehealth: Payer: Self-pay

## 2019-02-26 NOTE — Telephone Encounter (Signed)
CMA spoke to patient to inform on labs results and PCP advising.  Patient verified DOB. Pt. Understood.  Spanish interpreter assist with the call.

## 2019-02-26 NOTE — Telephone Encounter (Signed)
-----   Message from [name], NP sent at 02/20/2019  8:51 PM EDT ----- Vitamin D is normal. I would like for you to take citrical with vitamin D and make sure there is 1200mg  of calcium and at least 800 units or mg of vitamin D daily. This will help prevent osteoporosis.

## 2019-02-28 ENCOUNTER — Other Ambulatory Visit (HOSPITAL_COMMUNITY): Payer: Self-pay | Admitting: *Deleted

## 2019-02-28 DIAGNOSIS — N6310 Unspecified lump in the right breast, unspecified quadrant: Secondary | ICD-10-CM

## 2019-03-14 ENCOUNTER — Encounter (HOSPITAL_COMMUNITY): Payer: Self-pay | Admitting: Obstetrics and Gynecology

## 2019-03-14 ENCOUNTER — Other Ambulatory Visit: Payer: Self-pay

## 2019-03-14 ENCOUNTER — Ambulatory Visit (HOSPITAL_COMMUNITY)
Admission: RE | Admit: 2019-03-14 | Discharge: 2019-03-14 | Disposition: A | Discharge status: Home / Self Care | Payer: Self-pay | Source: Ambulatory Visit | Attending: Obstetrics and Gynecology | Admitting: Obstetrics and Gynecology

## 2019-03-14 ENCOUNTER — Other Ambulatory Visit (HOSPITAL_COMMUNITY): Payer: Self-pay | Admitting: Obstetrics and Gynecology

## 2019-03-14 ENCOUNTER — Encounter (HOSPITAL_COMMUNITY): Payer: Self-pay

## 2019-03-14 ENCOUNTER — Ambulatory Visit
Admission: RE | Admit: 2019-03-14 | Discharge: 2019-03-14 | Disposition: A | Discharge status: Home / Self Care | Payer: No Typology Code available for payment source | Source: Ambulatory Visit | Attending: Obstetrics and Gynecology | Admitting: Obstetrics and Gynecology

## 2019-03-14 VITALS — BP 122/84 | Temp 98.4°F | Wt 158.0 lb

## 2019-03-14 DIAGNOSIS — N6310 Unspecified lump in the right breast, unspecified quadrant: Secondary | ICD-10-CM

## 2019-03-14 DIAGNOSIS — N6320 Unspecified lump in the left breast, unspecified quadrant: Secondary | ICD-10-CM

## 2019-03-14 DIAGNOSIS — Z1239 Encounter for other screening for malignant neoplasm of breast: Secondary | ICD-10-CM

## 2019-03-14 DIAGNOSIS — N644 Mastodynia: Secondary | ICD-10-CM

## 2019-03-14 NOTE — Patient Instructions (Signed)
Explained breast self awareness with [name]. Patient did not need a Pap smear today due to last Pap smear was in 2018 per patient. Let her know [facility] will cover Pap smears every 3 years unless has a history of abnormal Pap smears. Referred patient to the [facility] for a diagnostic mammogram. Appointment scheduled for March 14, 2019 at 1320. Patient aware of appointment and will be there.[name] verbalized understanding.  [name], Arvil Chaco, RN 12:57 PM

## 2019-03-14 NOTE — Progress Notes (Signed)
Complaints that her PCP palpated a lump in one of her breasts two months ago. Patient not sure which breast. No lumps noted at patients visit with PCP 01/15/2019.  Pap Smear: Pap smear not completed today. Last Pap smear was in 2018 in [place] and normal per patient. Per patient has no history of an abnormal Pap smear. No Pap smear results are in Epic.  Physical exam: Breasts Right breast slightly larger than left breast due to patient has a history of a left breast lumpectomy 10 years ago for breast cancer. No skin abnormalities right breast. Observed a scar on the upper outer left breast from history of lumpectomy. No nipple retraction bilateral breasts. No nipple discharge bilateral breasts. No lymphadenopathy. No lumps palpated bilateral breasts. Complaints of right lower and left outer breast tenderness on exam. Referred patient to the [facility] for a diagnostic mammogram. Appointment scheduled for March 14, 2019 at 1320.        Pelvic/Bimanual No Pap smear completed today since last Pap smear was in 2018 per patient. Pap smear not indicated per BCCCP guidelines.   Smoking History: Patient is a former smoker that quit 29 years ago.  Patient Navigation: Patient education provided. Access to services provided for patient through [facility] program. Spanish interpreter provided.   Colorectal Cancer Screening: Per patient has never had a colonoscopy completed. FIT Test completed 01/15/2019 and negative. No complaints today.   Breast and Cervical Cancer Risk Assessment: Patient has no family history of breast cancer, known genetic mutations, or radiation treatment to the chest before age 59. Patient has no history of cervical dysplasia, immunocompromised, or DES exposure in-utero.  Risk Assessment    Risk Scores      03/14/2019   Last edited by: Armond Hang, LPN   5-year risk: 3 %   Lifetime risk: 9.8 %         Used Spanish interpreter Rudene Anda from [place].

## 2019-03-15 ENCOUNTER — Encounter (HOSPITAL_COMMUNITY): Payer: Self-pay | Admitting: *Deleted

## 2019-03-20 ENCOUNTER — Other Ambulatory Visit: Payer: Self-pay

## 2019-03-20 ENCOUNTER — Encounter (HOSPITAL_COMMUNITY): Payer: Self-pay

## 2019-03-20 ENCOUNTER — Emergency Department (HOSPITAL_COMMUNITY)
Admission: EM | Admit: 2019-03-20 | Discharge: 2019-03-20 | Disposition: A | Discharge status: Home / Self Care | Payer: Self-pay | Attending: Emergency Medicine | Admitting: Emergency Medicine

## 2019-03-20 ENCOUNTER — Emergency Department (HOSPITAL_COMMUNITY): Payer: Self-pay

## 2019-03-20 DIAGNOSIS — Z87891 Personal history of nicotine dependence: Secondary | ICD-10-CM | POA: Insufficient documentation

## 2019-03-20 DIAGNOSIS — Z79899 Other long term (current) drug therapy: Secondary | ICD-10-CM | POA: Insufficient documentation

## 2019-03-20 DIAGNOSIS — I10 Essential (primary) hypertension: Secondary | ICD-10-CM | POA: Insufficient documentation

## 2019-03-20 DIAGNOSIS — Z853 Personal history of malignant neoplasm of breast: Secondary | ICD-10-CM | POA: Insufficient documentation

## 2019-03-20 DIAGNOSIS — B349 Viral infection, unspecified: Secondary | ICD-10-CM | POA: Insufficient documentation

## 2019-03-20 LAB — URINALYSIS, ROUTINE W REFLEX MICROSCOPIC
Bilirubin Urine: NEGATIVE
Glucose, UA: NEGATIVE mg/dL
Hgb urine dipstick: NEGATIVE
Ketones, ur: NEGATIVE mg/dL
Nitrite: NEGATIVE
Protein, ur: NEGATIVE mg/dL
Specific Gravity, Urine: 1.016 (ref 1.005–1.030)
pH: 6 (ref 5.0–8.0)

## 2019-03-20 LAB — CBC WITH DIFFERENTIAL/PLATELET
Abs Immature Granulocytes: 0.02 10*3/uL (ref 0.00–0.07)
Basophils Absolute: 0 10*3/uL (ref 0.0–0.1)
Basophils Relative: 0 %
Eosinophils Absolute: 0 10*3/uL (ref 0.0–0.5)
Eosinophils Relative: 0 %
HCT: 39.5 % (ref 36.0–46.0)
Hemoglobin: 12.4 g/dL (ref 12.0–15.0)
Immature Granulocytes: 1 %
Lymphocytes Relative: 13 %
Lymphs Abs: 0.5 10*3/uL — ABNORMAL LOW (ref 0.7–4.0)
MCH: 26 pg (ref 26.0–34.0)
MCHC: 31.4 g/dL (ref 30.0–36.0)
MCV: 82.8 fL (ref 80.0–100.0)
Monocytes Absolute: 0.2 10*3/uL (ref 0.1–1.0)
Monocytes Relative: 5 %
Neutro Abs: 3.5 10*3/uL (ref 1.7–7.7)
Neutrophils Relative %: 81 %
Platelets: 298 10*3/uL (ref 150–400)
RBC: 4.77 MIL/uL (ref 3.87–5.11)
RDW: 13.5 % (ref 11.5–15.5)
WBC: 4.3 10*3/uL (ref 4.0–10.5)
nRBC: 0 % (ref 0.0–0.2)

## 2019-03-20 LAB — COMPREHENSIVE METABOLIC PANEL
ALT: 15 U/L (ref 0–44)
AST: 25 U/L (ref 15–41)
Albumin: 4.2 g/dL (ref 3.5–5.0)
Alkaline Phosphatase: 128 U/L — ABNORMAL HIGH (ref 38–126)
Anion gap: 10 (ref 5–15)
BUN: 10 mg/dL (ref 8–23)
CO2: 23 mmol/L (ref 22–32)
Calcium: 8.3 mg/dL — ABNORMAL LOW (ref 8.9–10.3)
Chloride: 101 mmol/L (ref 98–111)
Creatinine, Ser: 0.6 mg/dL (ref 0.44–1.00)
GFR calc Af Amer: >60 mL/min (ref >60)
GFR calc non Af Amer: >60 mL/min (ref >60)
Glucose, Bld: 100 mg/dL — ABNORMAL HIGH (ref 70–99)
Potassium: 3.5 mmol/L (ref 3.5–5.1)
Sodium: 134 mmol/L — ABNORMAL LOW (ref 135–145)
Total Bilirubin: 0.1 mg/dL — ABNORMAL LOW (ref 0.3–1.2)
Total Protein: 8.2 g/dL — ABNORMAL HIGH (ref 6.5–8.1)

## 2019-03-20 MED ORDER — SODIUM CHLORIDE 0.9 % IV SOLN
INTRAVENOUS | Status: DC
  Administered 2019-03-20: 13:00:00 via INTRAVENOUS

## 2019-03-20 MED ORDER — ACETAMINOPHEN 325 MG PO TABS
650.0000 mg | ORAL_TABLET | Freq: Once | ORAL | Status: DC
  Filled 2019-03-20: qty 2

## 2019-03-20 NOTE — ED Triage Notes (Signed)
Patient c/o fever,but T. Ws 99.5 at home and headache x 2 weeks. Patient states she had abdominal pain and emesis x 1  Prior to the fever and headache.

## 2019-03-20 NOTE — ED Provider Notes (Signed)
[facility] DEPT Provider Note   CSN: [id number] Arrival date & time: 03/20/19  3762    History   Chief Complaint No chief complaint on file.   HPI Maria Dominguez is a 68 y.o. female.     68 year old female presents with several days of mild headache with associated dizziness as well as temperature at home up to 99.7.  She says her head feels full but denies any sinus drainage.  No visual changes.  Patient is not English-speaking and this was performed via the video interpreter.  She denies any temperature above 100.4, cough, congestion.  She has not been short of breath.  She has not experience abdominal or chest discomfort.  States her dizziness seems to come and go and has not been associated with tinnitus.  No neck pain or photophobia.  No rashes.  No treatment used prior to arrival.     Past Medical History:  Diagnosis Date  . Hx of breast cancer   . Hx of gastric ulcer   . Hypertension     Patient Active Problem List   Diagnosis Date Noted  . essential hypertension 10/17/2017    Past Surgical History:  Procedure Laterality Date  . HUMERUS FRACTURE SURGERY    . left lumpectomy    . MASS EXCISION Left    left axilla     OB History    Gravida  2   Para      Term      Preterm      AB      Living  2     SAB      TAB      Ectopic      Multiple      Live Births  2            Home Medications    Prior to Admission medications   Medication Sig Start Date End Date Taking? Authorizing Provider  losartan (COZAAR) 50 MG tablet Take 1 tablet (50 mg total) by mouth daily. 01/15/19   [name], NP  rosuvastatin (CRESTOR) 10 MG tablet Take 1 tablet (10 mg total) by mouth daily. 01/16/19   [name], NP  traZODone (DESYREL) 50 MG tablet Take 0.5-1 tablets (25-50 mg total) by mouth at bedtime as needed for sleep. Patient not taking: Reported on 03/14/2019 01/15/19   [name], NP  Vitamin D, Ergocalciferol,  (DRISDOL) 1.25 MG (50000 UT) CAPS capsule Take 1 capsule (50,000 Units total) by mouth every 7 (seven) days. 01/16/19   [name], NP    Family History Family History  Problem Relation Age of Onset  . Diabetes Mother   . Breast cancer Sister     Social History Social History   Tobacco Use  . Smoking status: Former Research scientist (life sciences)  . Smokeless tobacco: Never Used  Substance Use Topics  . Alcohol use: Not Currently    Frequency: Never  . Drug use: No     Allergies   Patient has no known allergies.   Review of Systems Review of Systems  All other systems reviewed and are negative.    Physical Exam Updated Vital Signs BP (!) 149/84 (BP Location: Right Arm)   Pulse 76   Temp 99.4 F (37.4 C) (Oral)   Resp 17   SpO2 92%   Physical Exam Vitals signs and nursing note reviewed.  Constitutional:      General: She is not in acute distress.  Appearance: Normal appearance. She is well-developed. She is not toxic-appearing.  HENT:     Head: Normocephalic and atraumatic.  Eyes:     General: Lids are normal.     Conjunctiva/sclera: Conjunctivae normal.     Pupils: Pupils are equal, round, and reactive to light.  Neck:     Musculoskeletal: Normal range of motion and neck supple.     Thyroid: No thyroid mass.     Trachea: No tracheal deviation.  Cardiovascular:     Rate and Rhythm: Normal rate and regular rhythm.     Heart sounds: Normal heart sounds. No murmur. No gallop.   Pulmonary:     Effort: Pulmonary effort is normal. No respiratory distress.     Breath sounds: Normal breath sounds. No stridor. No decreased breath sounds, wheezing, rhonchi or rales.  Abdominal:     General: Bowel sounds are normal. There is no distension.     Palpations: Abdomen is soft.     Tenderness: There is no abdominal tenderness. There is no rebound.  Musculoskeletal: Normal range of motion.        General: No tenderness.  Skin:    General: Skin is warm and dry.     Findings: No  abrasion or rash.  Neurological:     Mental Status: She is alert and oriented to person, place, and time.     GCS: GCS eye subscore is 4. GCS verbal subscore is 5. GCS motor subscore is 6.     Cranial Nerves: No cranial nerve deficit.     Sensory: No sensory deficit.     Gait: Gait normal.  Psychiatric:        Speech: Speech normal.        Behavior: Behavior normal.      ED Treatments / Results  Labs (all labs ordered are listed, but only abnormal results are displayed) Labs Reviewed  URINALYSIS, ROUTINE W REFLEX MICROSCOPIC  CBC WITH DIFFERENTIAL/PLATELET  COMPREHENSIVE METABOLIC PANEL    EKG None  Radiology No results found.  Procedures Procedures (including critical care time)  Medications Ordered in ED Medications  0.9 %  sodium chloride infusion (has no administration in time range)     Initial Impression / Assessment and Plan / ED Course  I have reviewed the triage vital signs and the nursing notes.  Pertinent labs & imaging results that were available during my care of the patient were reviewed by me and considered in my medical decision making (see chart for details).        Patient's head CT without acute findings.  She is afebrile here.  Labs are reassuring.  Feels better after Tylenol.  Return precautions given.  Interpreter video used  Final Clinical Impressions(s) / ED Diagnoses   Final diagnoses:  None    ED Discharge Orders    None       Lacretia Leigh, MD 03/20/19 1438

## 2019-03-20 NOTE — ED Notes (Signed)
Bed: WTR9 Expected date:  Expected time:  Means of arrival:  Comments: 

## 2019-04-09 MED FILL — LOSARTAN POTASSIUM 50 MG TA: 50 | 30 days supply | Qty: 30 | Fill #2

## 2019-04-16 ENCOUNTER — Ambulatory Visit: Payer: Self-pay | Admitting: Nurse Practitioner

## 2019-06-03 MED FILL — LOSARTAN POTASSIUM 50 MG TA: 50 | 30 days supply | Qty: 30 | Fill #3

## 2019-07-08 MED FILL — LOSARTAN POTASSIUM 50 MG TA: 50 | 30 days supply | Qty: 30 | Fill #4

## 2019-08-09 MED FILL — LOSARTAN POTASSIUM 50 MG TA: 50 | 30 days supply | Qty: 30 | Fill #5

## 2019-09-17 IMAGING — [facility] DIGITAL DIAGNOSTIC BILATERAL MAMMOGRAM WITH TOMO AND CAD
8 series · 8 of 24 positions shown · non-contrast
Comparison: None.

CLINICAL DATA: 67-year-old female with bilateral breast pain.
History of unknown LEFT breast surgery with possible tamoxifen
treatment.

EXAM:
DIGITAL DIAGNOSTIC BILATERAL MAMMOGRAM WITH CAD AND TOMO
ULTRASOUND BILATERAL BREAST

[L MLO synth-2D]
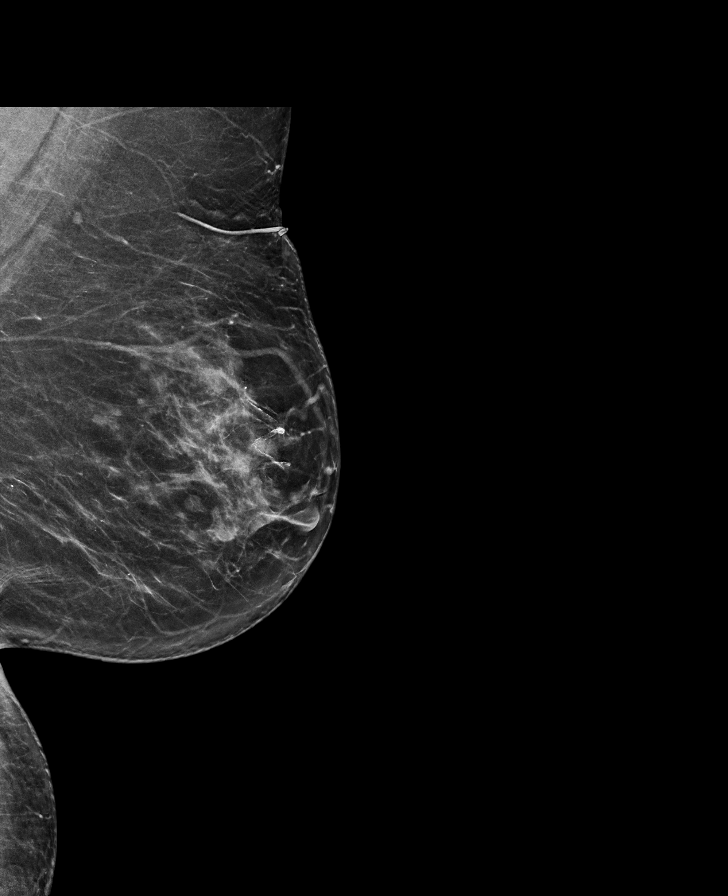

[L CC synth-2D]
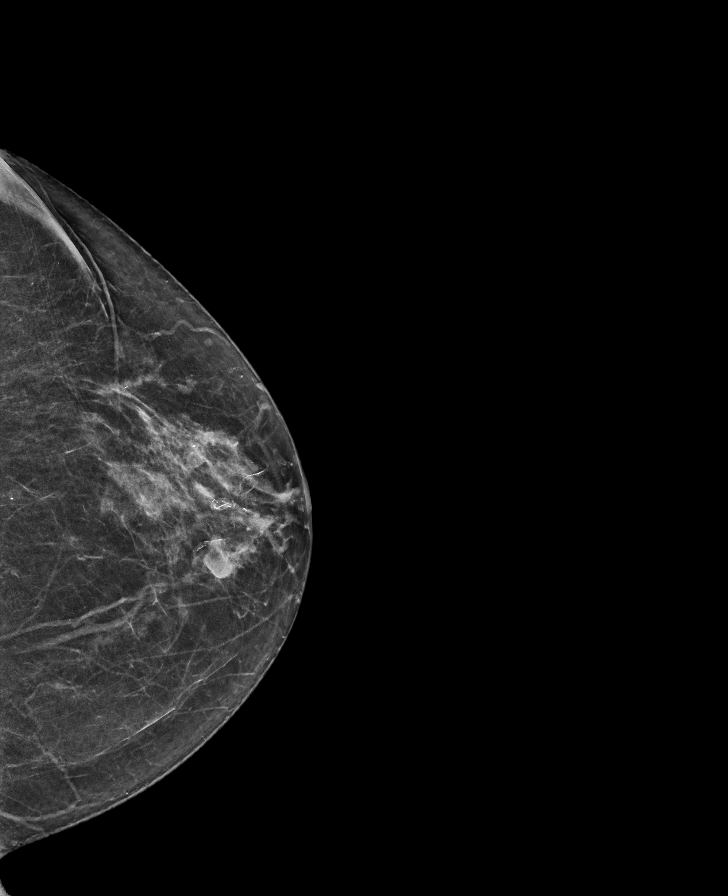

[R MLO synth-2D]
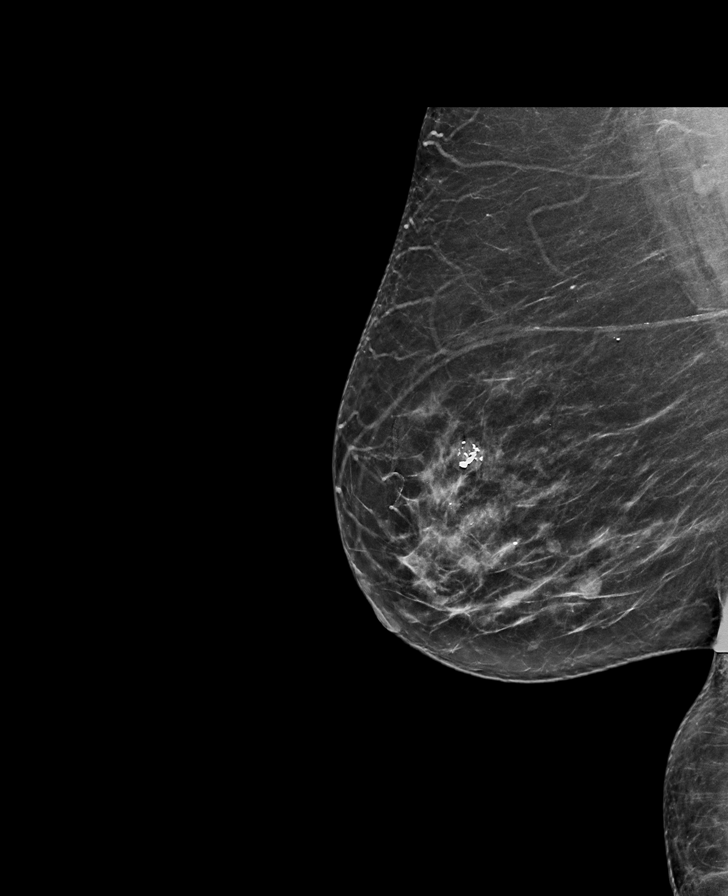

[R CC synth-2D]
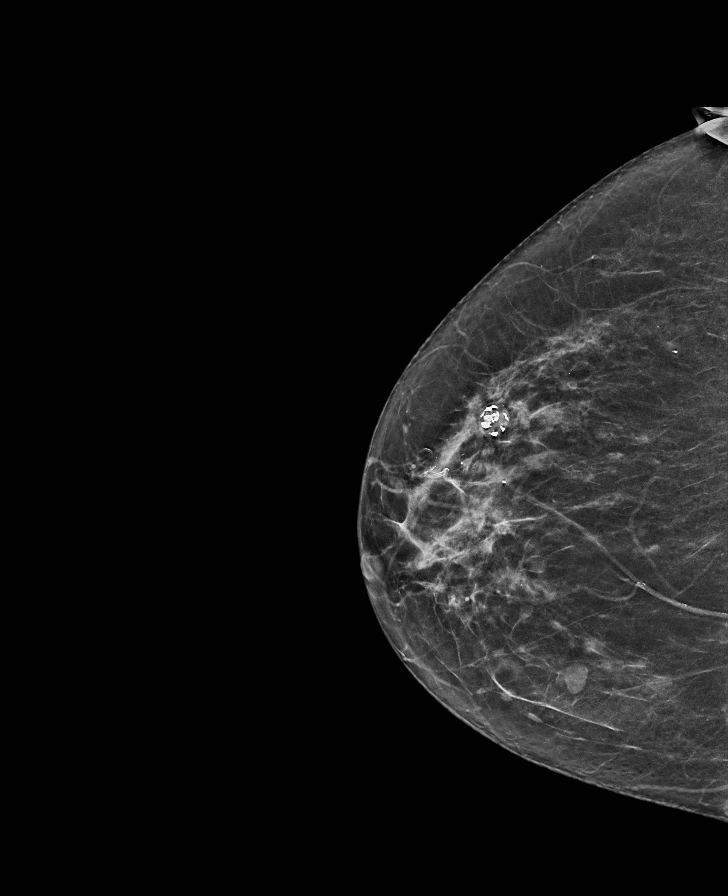

[R CC tomo · tomo slice 32/63.0]
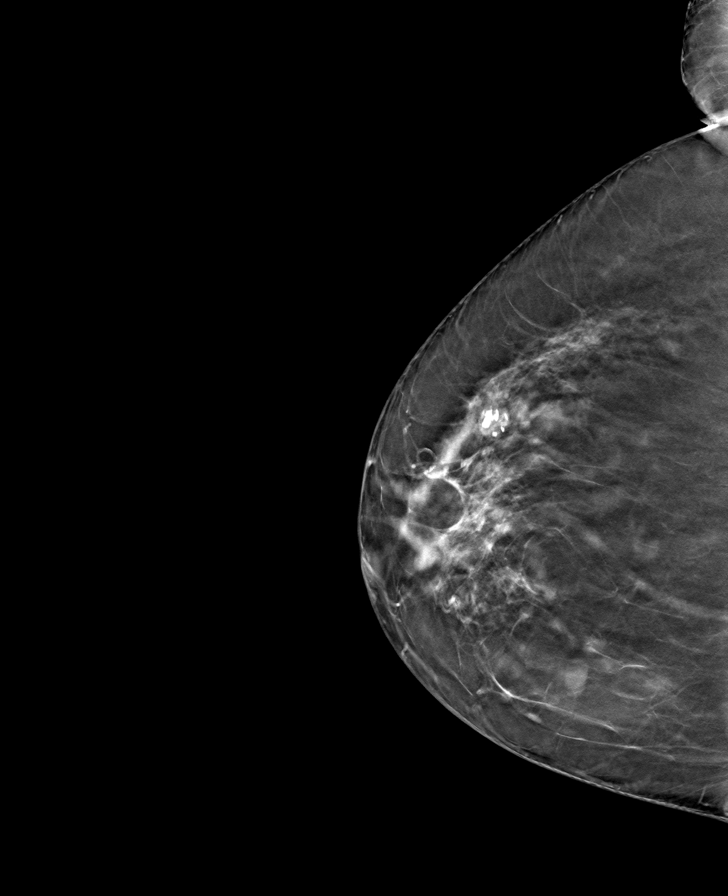

[R MLO tomo · tomo slice 35/70.0]
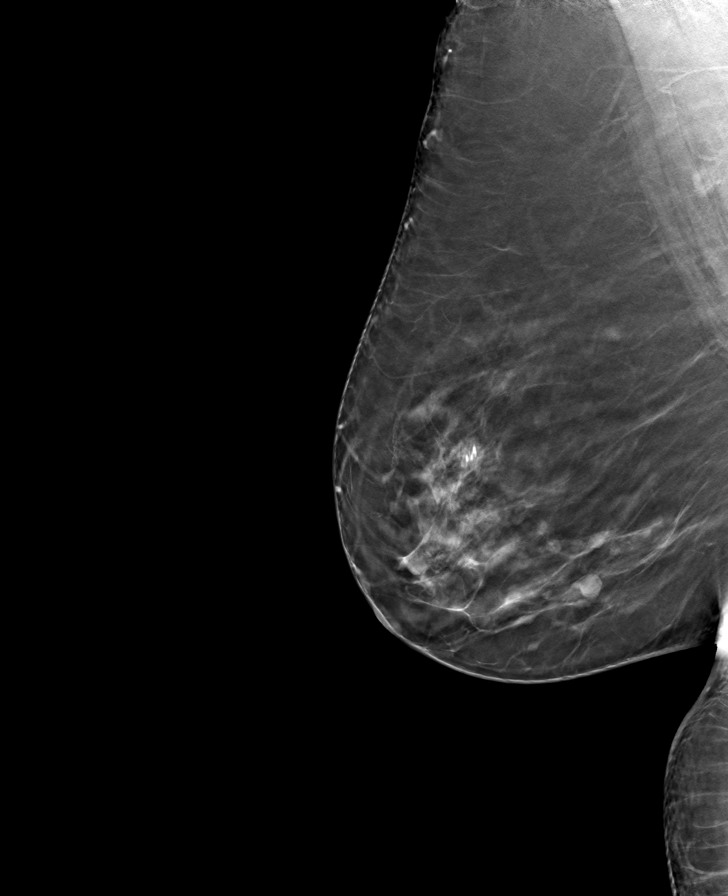

[L CC tomo · tomo slice 31/61.0]
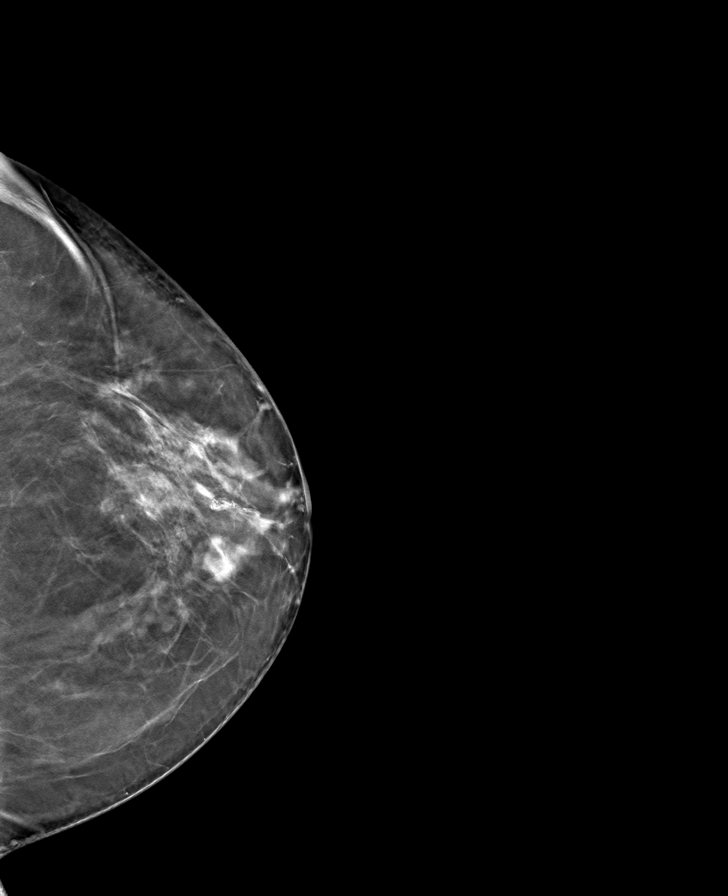

[L MLO tomo · tomo slice 34/67.0]
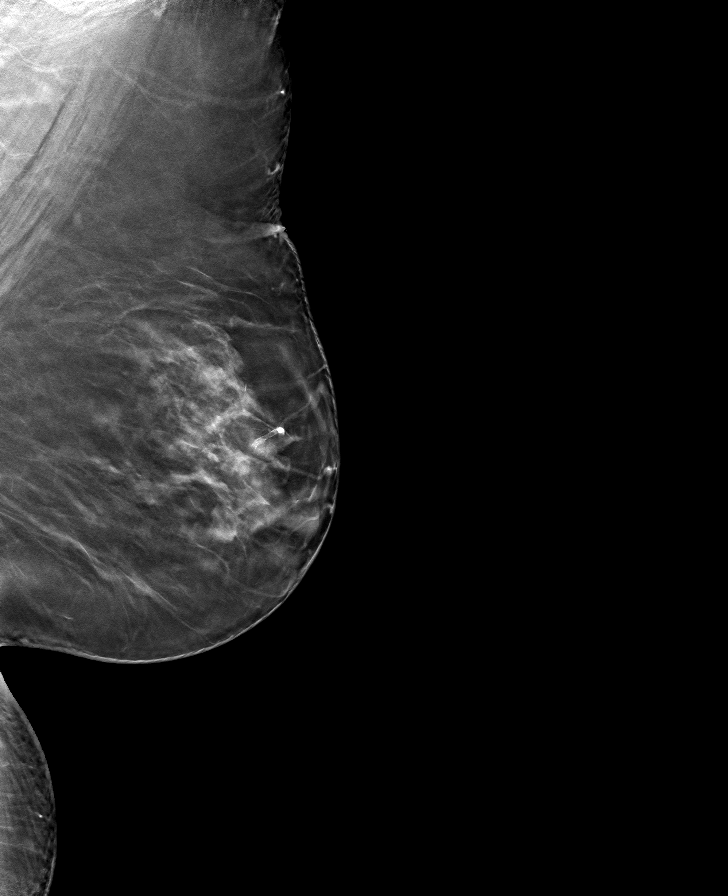

[8 of 24 positions shown; findings below may reference images not displayed]

Prior mammograms from [REDACTED] could not be
obtained.

ACR Breast Density Category b: There are scattered areas of
fibroglandular density.
FINDINGS: 2D/3D full field views of both breasts demonstrate several
circumscribed oval masses within the central breasts and INNER LEFT
breast.

No other suspicious mass, nonsurgical distortion or worrisome
calcifications noted.

Mammographic images were processed with CAD.

Targeted ultrasound is performed, showing multiple benign simple and
minimally complicated cysts within both breasts corresponding to the
mammographic findings. The largest cysts measure 9 mm at the 3
o'clock position of the RIGHT breast 4 cm from the nipple and 1.1 cm
at the 9 o'clock position of the LEFT breast 1 cm from the nipple.
IMPRESSION: 1. Bilateral benign breast cysts.
2. No evidence of breast malignancy.

RECOMMENDATION:
Bilateral screening mammogram in 1 year

I have discussed the findings and recommendations with the patient
through an interpreter. If applicable, a reminder letter will be
sent to the patient regarding the next appointment.

BI-RADS CATEGORY  2: Benign.

## 2019-09-18 ENCOUNTER — Other Ambulatory Visit: Payer: Self-pay | Admitting: Nurse Practitioner

## 2019-09-18 ENCOUNTER — Other Ambulatory Visit: Payer: Self-pay | Admitting: Pharmacist

## 2019-09-18 DIAGNOSIS — I10 Essential (primary) hypertension: Secondary | ICD-10-CM

## 2019-09-18 MED ORDER — LOSARTAN POTASSIUM 50 MG PO TABS: 50.0000 mg | ORAL_TABLET | Freq: Every day | ORAL | 0 refills | Status: DC

## 2019-09-18 MED FILL — LOSARTAN POTASSIUM 50 MG TA: 50 | 30 days supply | Qty: 30 | Fill #0

## 2019-09-25 ENCOUNTER — Other Ambulatory Visit: Payer: Self-pay

## 2019-09-25 ENCOUNTER — Ambulatory Visit: Payer: Self-pay | Attending: Nurse Practitioner | Admitting: Physician Assistant

## 2019-09-25 VITALS — BP 128/81 | HR 72 | Resp 16 | Wt 155.6 lb

## 2019-09-25 DIAGNOSIS — T452X1A Poisoning by vitamins, accidental (unintentional), initial encounter: Secondary | ICD-10-CM

## 2019-09-25 DIAGNOSIS — F5101 Primary insomnia: Secondary | ICD-10-CM

## 2019-09-25 DIAGNOSIS — I10 Essential (primary) hypertension: Secondary | ICD-10-CM

## 2019-09-25 DIAGNOSIS — E785 Hyperlipidemia, unspecified: Secondary | ICD-10-CM

## 2019-09-25 MED ORDER — ROSUVASTATIN CALCIUM 10 MG PO TABS: 10.0000 mg | ORAL_TABLET | Freq: Every day | ORAL | 1 refills | Status: DC

## 2019-09-25 MED ORDER — LOSARTAN POTASSIUM 50 MG PO TABS: 50.0000 mg | ORAL_TABLET | Freq: Every day | ORAL | 1 refills | Status: DC

## 2019-09-25 MED ORDER — TRAZODONE HCL 50 MG PO TABS: 25.0000 mg | ORAL_TABLET | Freq: Every evening | ORAL | 1 refills | Status: DC | PRN

## 2019-09-25 MED FILL — traZODone HCL 50 MG TABS: 50 | 30 days supply | Qty: 30 | Fill #0

## 2019-09-25 MED FILL — ROSUVASTATIN CALCIUM 10 MG: 10 | 30 days supply | Qty: 30 | Fill #0

## 2019-09-25 NOTE — Progress Notes (Signed)
Maria Dominguez, is a 68 y.o. female  [id number]  [id number]  DOB - 1950-10-26  Subjective:  Chief Complaint and HPI: Maria Dominguez is a 68 y.o. female here today for med RF.  She has been out of cholesterol meds and sleep meds for months.  No new complaints today.  BP has been good.  No CP/SOB/HA.    Jesus with [employer] interpreters translating  ROS:   Constitutional:  No f/c, No night sweats, No unexplained weight loss. EENT:  No vision changes, No blurry vision, No hearing changes. No mouth, throat, or ear problems.  Respiratory: No cough, No SOB Cardiac: No CP, no palpitations GI:  No abd pain, No N/V/D. GU: No Urinary s/sx Musculoskeletal: No joint pain Neuro: No headache, no dizziness, no motor weakness.  Skin: No rash Endocrine:  No polydipsia. No polyuria.  Psych: Denies SI/HI  No problems updated.  ALLERGIES: No Known Allergies  PAST MEDICAL HISTORY: Past Medical History:  Diagnosis Date  . Hx of breast cancer   . Hx of gastric ulcer   . Hypertension     MEDICATIONS AT HOME: Prior to Admission medications   Medication Sig Start Date End Date Taking? Authorizing Provider  acetaminophen (TYLENOL) 500 MG tablet Take 500 mg by mouth every 6 (six) hours as needed for mild pain.    [provider]  losartan (COZAAR) 50 MG tablet Take 1 tablet (50 mg total) by mouth daily. 09/25/19   Argentina Donovan, PA-C  rosuvastatin (CRESTOR) 10 MG tablet Take 1 tablet (10 mg total) by mouth daily. 09/25/19   Argentina Donovan, PA-C  traZODone (DESYREL) 50 MG tablet Take 0.5-1 tablets (25-50 mg total) by mouth at bedtime as needed for sleep. 09/25/19   Argentina Donovan, PA-C  Vitamin D, Ergocalciferol, (DRISDOL) 1.25 MG (50000 UT) CAPS capsule Take 1 capsule (50,000 Units total) by mouth every 7 (seven) days. Patient not taking: Reported on 03/20/2019 01/16/19   [name], NP     Objective:  EXAM:   Vitals:   09/25/19 1600  BP: 128/81  Pulse: 72    Resp: 16  SpO2: 97%  Weight: 155 lb 9.6 oz (70.6 kg)    General appearance : A&OX3. NAD. Non-toxic-appearing HEENT: Atraumatic and Normocephalic.  PERRLA. EOM intact.Neck: supple, no JVD. No cervical lymphadenopathy. No thyromegaly Chest/Lungs:  Breathing-non-labored, Good air entry bilaterally, breath sounds normal without rales, rhonchi, or wheezing  CVS: S1 S2 regular, no murmurs, gallops, rubs  Extremities: Bilateral Lower Ext shows no edema, both legs are warm to touch with = pulse throughout Neurology:  CN II-XII grossly intact, Non focal.   Psych:  TP linear. J/I WNL. Normal speech. Appropriate eye contact and affect.  Skin:  No Rash  Data Review No results found for: HGBA1C   Assessment & Plan   1. Essential hypertension controlled - Comprehensive metabolic panel - CBC with Differential - losartan (COZAAR) 50 MG tablet; Take 1 tablet (50 mg total) by mouth daily.  Dispense: 90 tablet; Refill: 1  2. Hyperlipidemia, unspecified hyperlipidemia type Likely not controlled bc she has been out of meds - Comprehensive metabolic panel - Lipid panel - rosuvastatin (CRESTOR) 10 MG tablet; Take 1 tablet (10 mg total) by mouth daily.  Dispense: 90 tablet; Refill: 1  3. Primary insomnia - traZODone (DESYREL) 50 MG tablet; Take 0.5-1 tablets (25-50 mg total) by mouth at bedtime as needed for sleep.  Dispense: 90 tablet; Refill: 1  4. Vitamin D toxicity, accidental or  unintentional, initial encounter - Vitamin D, 25-hydroxy   Patient have been counseled extensively about nutrition and exercise  Return in about 6 months (around 03/25/2020) for PCP for cholesterol/Bp/insomnia.  The patient was given clear instructions to go to ER or return to medical center if symptoms don't improve, worsen or new problems develop. The patient verbalized understanding. The patient was told to call to get lab results if they haven't heard anything in the next week.     Freeman Caldron, PA-C [facility] [place], [place]   09/25/2019, 4:07 PMPatient ID: Maria Dominguez, female   DOB: 10/18/1950, 68 y.o.   MRN: [id number]

## 2019-09-26 LAB — LIPID PANEL
Chol/HDL Ratio: 4 ratio (ref 0.0–4.4)
Cholesterol, Total: 152 mg/dL (ref 100–199)
HDL: 38 mg/dL — ABNORMAL LOW (ref >39)
LDL Chol Calc (NIH): 49 mg/dL (ref 0–99)
Triglycerides: 436 mg/dL — ABNORMAL HIGH (ref 0–149)
VLDL Cholesterol Cal: 65 mg/dL — ABNORMAL HIGH (ref 5–40)

## 2019-09-26 LAB — COMPREHENSIVE METABOLIC PANEL
ALT: 12 IU/L (ref 0–32)
AST: 19 IU/L (ref 0–40)
Albumin/Globulin Ratio: 1.4 (ref 1.2–2.2)
Albumin: 4.3 g/dL (ref 3.8–4.8)
Alkaline Phosphatase: 142 IU/L — ABNORMAL HIGH (ref 39–117)
BUN/Creatinine Ratio: 25 (ref 12–28)
BUN: 17 mg/dL (ref 8–27)
Bilirubin Total: 0.3 mg/dL (ref 0.0–1.2)
CO2: 25 mmol/L (ref 20–29)
Calcium: 9.9 mg/dL (ref 8.7–10.3)
Chloride: 100 mmol/L (ref 96–106)
Creatinine, Ser: 0.69 mg/dL (ref 0.57–1.00)
GFR calc Af Amer: 103 mL/min/{1.73_m2} (ref >59)
GFR calc non Af Amer: 89 mL/min/{1.73_m2} (ref >59)
Globulin, Total: 3 g/dL (ref 1.5–4.5)
Glucose: 93 mg/dL (ref 65–99)
Potassium: 5.2 mmol/L (ref 3.5–5.2)
Sodium: 137 mmol/L (ref 134–144)
Total Protein: 7.3 g/dL (ref 6.0–8.5)

## 2019-09-26 LAB — CBC WITH DIFFERENTIAL/PLATELET
Basophils Absolute: 0 10*3/uL (ref 0.0–0.2)
Basos: 0 %
EOS (ABSOLUTE): 0.2 10*3/uL (ref 0.0–0.4)
Eos: 3 %
Hematocrit: 34.2 % (ref 34.0–46.6)
Hemoglobin: 11.5 g/dL (ref 11.1–15.9)
Immature Grans (Abs): 0 10*3/uL (ref 0.0–0.1)
Immature Granulocytes: 1 %
Lymphocytes Absolute: 1 10*3/uL (ref 0.7–3.1)
Lymphs: 14 %
MCH: 26.7 pg (ref 26.6–33.0)
MCHC: 33.6 g/dL (ref 31.5–35.7)
MCV: 79 fL (ref 79–97)
Monocytes Absolute: 0.6 10*3/uL (ref 0.1–0.9)
Monocytes: 8 %
Neutrophils Absolute: 5 10*3/uL (ref 1.4–7.0)
Neutrophils: 74 %
Platelets: 411 10*3/uL (ref 150–450)
RBC: 4.31 x10E6/uL (ref 3.77–5.28)
RDW: 13.7 % (ref 11.7–15.4)
WBC: 6.7 10*3/uL (ref 3.4–10.8)

## 2019-09-26 LAB — VITAMIN D 25 HYDROXY (VIT D DEFICIENCY, FRACTURES): Vit D, 25-Hydroxy: 45.2 ng/mL (ref 30.0–100.0)

## 2019-10-21 MED FILL — LOSARTAN POTASSIUM 50 MG TA: 50 | 30 days supply | Qty: 30 | Fill #0

## 2019-11-08 MED FILL — ROSUVASTATIN CALCIUM 10 MG: 10 | 30 days supply | Qty: 30 | Fill #1

## 2019-11-08 MED FILL — traZODone HCL 50 MG TABS: 50 | 30 days supply | Qty: 30 | Fill #1

## 2019-11-24 ENCOUNTER — Other Ambulatory Visit: Payer: Self-pay

## 2019-11-24 ENCOUNTER — Emergency Department (HOSPITAL_COMMUNITY): Payer: No Typology Code available for payment source

## 2019-11-24 ENCOUNTER — Inpatient Hospital Stay (HOSPITAL_COMMUNITY)
Admission: EM | Admit: 2019-11-24 | Discharge: 2019-11-29 | DRG: 391 | Disposition: A | Discharge status: Home / Self Care | Payer: Self-pay | Attending: General Surgery | Admitting: General Surgery

## 2019-11-24 ENCOUNTER — Encounter (HOSPITAL_COMMUNITY): Payer: Self-pay

## 2019-11-24 DIAGNOSIS — J189 Pneumonia, unspecified organism: Secondary | ICD-10-CM | POA: Diagnosis present

## 2019-11-24 DIAGNOSIS — Z87891 Personal history of nicotine dependence: Secondary | ICD-10-CM

## 2019-11-24 DIAGNOSIS — Z20822 Contact with and (suspected) exposure to covid-19: Secondary | ICD-10-CM | POA: Diagnosis present

## 2019-11-24 DIAGNOSIS — J9601 Acute respiratory failure with hypoxia: Secondary | ICD-10-CM | POA: Diagnosis present

## 2019-11-24 DIAGNOSIS — K760 Fatty (change of) liver, not elsewhere classified: Secondary | ICD-10-CM | POA: Diagnosis present

## 2019-11-24 DIAGNOSIS — K5792 Diverticulitis of intestine, part unspecified, without perforation or abscess without bleeding: Secondary | ICD-10-CM | POA: Diagnosis present

## 2019-11-24 DIAGNOSIS — E876 Hypokalemia: Secondary | ICD-10-CM | POA: Diagnosis present

## 2019-11-24 DIAGNOSIS — K5900 Constipation, unspecified: Secondary | ICD-10-CM | POA: Diagnosis present

## 2019-11-24 DIAGNOSIS — J9811 Atelectasis: Secondary | ICD-10-CM | POA: Diagnosis present

## 2019-11-24 DIAGNOSIS — Z8711 Personal history of peptic ulcer disease: Secondary | ICD-10-CM

## 2019-11-24 DIAGNOSIS — D649 Anemia, unspecified: Secondary | ICD-10-CM | POA: Diagnosis present

## 2019-11-24 DIAGNOSIS — G4700 Insomnia, unspecified: Secondary | ICD-10-CM | POA: Diagnosis present

## 2019-11-24 DIAGNOSIS — E785 Hyperlipidemia, unspecified: Secondary | ICD-10-CM | POA: Diagnosis present

## 2019-11-24 DIAGNOSIS — Z803 Family history of malignant neoplasm of breast: Secondary | ICD-10-CM

## 2019-11-24 DIAGNOSIS — K5720 Diverticulitis of large intestine with perforation and abscess without bleeding: Principal | ICD-10-CM | POA: Diagnosis present

## 2019-11-24 DIAGNOSIS — I10 Essential (primary) hypertension: Secondary | ICD-10-CM | POA: Diagnosis present

## 2019-11-24 DIAGNOSIS — Z853 Personal history of malignant neoplasm of breast: Secondary | ICD-10-CM

## 2019-11-24 DIAGNOSIS — Z79899 Other long term (current) drug therapy: Secondary | ICD-10-CM

## 2019-11-24 LAB — COMPREHENSIVE METABOLIC PANEL
ALT: 16 U/L (ref 0–44)
AST: 21 U/L (ref 15–41)
Albumin: 3.7 g/dL (ref 3.5–5.0)
Alkaline Phosphatase: 88 U/L (ref 38–126)
Anion gap: 10 (ref 5–15)
BUN: 20 mg/dL (ref 8–23)
CO2: 19 mmol/L — ABNORMAL LOW (ref 22–32)
Calcium: 8.2 mg/dL — ABNORMAL LOW (ref 8.9–10.3)
Chloride: 102 mmol/L (ref 98–111)
Creatinine, Ser: 0.53 mg/dL (ref 0.44–1.00)
GFR calc Af Amer: >60 mL/min (ref >60)
GFR calc non Af Amer: >60 mL/min (ref >60)
Glucose, Bld: 128 mg/dL — ABNORMAL HIGH (ref 70–99)
Potassium: 3.5 mmol/L (ref 3.5–5.1)
Sodium: 131 mmol/L — ABNORMAL LOW (ref 135–145)
Total Bilirubin: 0.4 mg/dL (ref 0.3–1.2)
Total Protein: 7.2 g/dL (ref 6.5–8.1)

## 2019-11-24 LAB — CBC
HCT: 38 % (ref 36.0–46.0)
Hemoglobin: 11.9 g/dL — ABNORMAL LOW (ref 12.0–15.0)
MCH: 26.3 pg (ref 26.0–34.0)
MCHC: 31.3 g/dL (ref 30.0–36.0)
MCV: 83.9 fL (ref 80.0–100.0)
Platelets: 334 10*3/uL (ref 150–400)
RBC: 4.53 MIL/uL (ref 3.87–5.11)
RDW: 13.2 % (ref 11.5–15.5)
WBC: 12.9 10*3/uL — ABNORMAL HIGH (ref 4.0–10.5)
nRBC: 0 % (ref 0.0–0.2)

## 2019-11-24 LAB — LIPASE, BLOOD: Lipase: 33 U/L (ref 11–51)

## 2019-11-24 LAB — LACTIC ACID, PLASMA: Lactic Acid, Venous: 2.8 mmol/L (ref 0.5–1.9)

## 2019-11-24 MED ORDER — SODIUM CHLORIDE 0.9 % IV BOLUS
1000.0000 mL | Freq: Once | INTRAVENOUS | Status: AC
  Administered 2019-11-25: 1000 mL via INTRAVENOUS

## 2019-11-24 MED ORDER — MORPHINE SULFATE (PF) 4 MG/ML IV SOLN
4.0000 mg | Freq: Once | INTRAVENOUS | Status: AC
  Administered 2019-11-24: 4 mg via INTRAVENOUS
  Filled 2019-11-24: qty 1

## 2019-11-24 MED ORDER — SODIUM CHLORIDE 0.9% FLUSH: 3.0000 mL | Freq: Once | INTRAVENOUS | Status: DC

## 2019-11-24 NOTE — ED Notes (Signed)
Date and time results received: 11/24/19 11:43 PM  (use smartphrase ".now" to insert current time)  Test: Lactic Acid Critical Value: 2.8  Name of Provider Notified: Charmaine Downs, [place]

## 2019-11-24 NOTE — ED Triage Notes (Signed)
Pt reports severe lower abdominal pain x3 hours and has vomited several times since. States that she was just laying on the couch when it started. She also reports chills. Denies sick contacts. Spanish interpreter used.

## 2019-11-24 NOTE — ED Provider Notes (Signed)
[facility] DEPT Provider Note   CSN: [id number] Arrival date & time: 11/24/19  2119     History Chief Complaint  Patient presents with  . Abdominal Pain    Maria Dominguez is a 69 y.o. female with a past medical history significant for hypertension, hyperlipidemia, fatty liver, and perforated gastric ulcer who presents to the ED due to sudden onset of severe bilateral lower abdominal pain x3 hours associated with nonbloody, nonbilious emesis.  Patient denies sick contacts and Covid exposures.  Patient describes pain as a severe cramp.  She denies vaginal and urinary symptoms.  Denies melena, hematochezia, and hematemesis.  Patient denies fever, but admits to chills. She rates her pain a 10/10 and worse with movement. She notes she has had pain before, but not this severe.  No past abdominal operations.  Denies chest pain and shortness of breath. She has tried 2 over the counter pain medications prior to arrival.   Financial risk analyst used throughout entire encounter.     Past Medical History:  Diagnosis Date  . Hx of breast cancer   . Hx of gastric ulcer   . Hypertension     Patient Active Problem List   Diagnosis Date Noted  . essential hypertension 10/17/2017    Past Surgical History:  Procedure Laterality Date  . HUMERUS FRACTURE SURGERY    . left lumpectomy    . MASS EXCISION Left    left axilla     OB History    Gravida  2   Para      Term      Preterm      AB      Living  2     SAB      TAB      Ectopic      Multiple      Live Births  2           Family History  Problem Relation Age of Onset  . Diabetes Mother   . Breast cancer Sister     Social History   Tobacco Use  . Smoking status: Former Research scientist (life sciences)  . Smokeless tobacco: Never Used  Substance Use Topics  . Alcohol use: Not Currently  . Drug use: No    Home Medications Prior to Admission medications   Medication Sig Start Date End Date  Taking? Authorizing Provider  acetaminophen (TYLENOL) 500 MG tablet Take 500 mg by mouth every 6 (six) hours as needed for mild pain.    [provider]  losartan (COZAAR) 50 MG tablet Take 1 tablet (50 mg total) by mouth daily. 09/25/19   Argentina Donovan, PA-C  rosuvastatin (CRESTOR) 10 MG tablet Take 1 tablet (10 mg total) by mouth daily. 09/25/19   Argentina Donovan, PA-C  traZODone (DESYREL) 50 MG tablet Take 0.5-1 tablets (25-50 mg total) by mouth at bedtime as needed for sleep. 09/25/19   Argentina Donovan, PA-C  Vitamin D, Ergocalciferol, (DRISDOL) 1.25 MG (50000 UT) CAPS capsule Take 1 capsule (50,000 Units total) by mouth every 7 (seven) days. Patient not taking: Reported on 03/20/2019 01/16/19   [name], NP    Allergies    Patient has no known allergies.  Review of Systems   Review of Systems  Constitutional: Positive for chills. Negative for fever.  Respiratory: Negative for shortness of breath.   Cardiovascular: Negative for chest pain.  Gastrointestinal: Positive for abdominal pain, nausea and vomiting. Negative for abdominal distention, constipation and  diarrhea.  Genitourinary: Negative for dysuria, vaginal bleeding and vaginal discharge.  Musculoskeletal: Negative for back pain.  All other systems reviewed and are negative.   Physical Exam Updated Vital Signs BP (!) 161/85 (BP Location: Right Arm)   Pulse 93   Temp 98.1 F (36.7 C) (Oral)   Resp 18   SpO2 97%   Physical Exam Vitals and nursing note reviewed.  Constitutional:      General: She is not in acute distress.    Appearance: She is ill-appearing.     Comments: Appears to be very uncomfortable in bed wrapped in numerous blankets.   HENT:     Head: Normocephalic.  Eyes:     Pupils: Pupils are equal, round, and reactive to light.  Cardiovascular:     Rate and Rhythm: Normal rate and regular rhythm.     Pulses: Normal pulses.     Heart sounds: Normal heart sounds. No murmur. No  friction rub. No gallop.   Pulmonary:     Effort: Pulmonary effort is normal.     Breath sounds: Normal breath sounds.  Abdominal:     General: Abdomen is flat. Bowel sounds are normal. There is no distension.     Palpations: Abdomen is soft.     Tenderness: There is abdominal tenderness. There is guarding. There is no rebound.     Comments: Tenderness to palpation in bilateral lower quadrants with voluntary guarding. No rebound. Tenderness at Mcburney's point. Negative murphy sign.   Musculoskeletal:     Cervical back: Neck supple.     Comments: Able to move all 4 extremities without difficulty.  No lower extremity edema.  Skin:    General: Skin is warm and dry.  Neurological:     General: No focal deficit present.     Mental Status: She is alert.  Psychiatric:        Mood and Affect: Mood normal.        Behavior: Behavior normal.     ED Results / Procedures / Treatments   Labs (all labs ordered are listed, but only abnormal results are displayed) Labs Reviewed  LIPASE, BLOOD  COMPREHENSIVE METABOLIC PANEL  CBC  URINALYSIS, ROUTINE W REFLEX MICROSCOPIC  TROPONIN I (HIGH SENSITIVITY)    EKG None  Radiology No results found.  Procedures Procedures (including critical care time)  Medications Ordered in ED Medications  sodium chloride flush (NS) 0.9 % injection 3 mL (has no administration in time range)  morphine 4 MG/ML injection 4 mg (has no administration in time range)    ED Course  I have reviewed the triage vital signs and the nursing notes.  Pertinent labs & imaging results that were available during my care of the patient were reviewed by me and considered in my medical decision making (see chart for details).  Clinical Course as of Feb 15 0002  Sun Nov 24, 2019  2352 Lactic Acid, Venous(!!): 2.8 [CA]    Clinical Course User Index [CA] Suzy Bouchard, PA-C   MDM Rules/Calculators/A&P                     69 year old female presents to the ED due  to sudden onset of severe bilateral lower quadrant abdominal pain.  Denies vaginal and urinary symptoms. Admits to chills, but denies fever. No sick contacts or COVID exposures. Stable vitals. Patient afebrile, not tachycardic or hypoxic. Patient appears extremely uncomfortable in bed wrapped in numerous blankets. Tenderness to palpation in bilateral lower quadrants  with voluntary guarding. Will obtain routine labs, troponin, EKG, and CT scan to rule out appendicitis and diverticulitis. Will also obtain x-rays to check for free air. Discussed case with Dr. Wyvonnia Dusky who evaluated patient at bedside and agrees with assessment and plan.   CXR and Abdominal x-ray negative for free air.  EKG personally reviewed which demonstrates normal sinus rhythm with no signs of ischemia. CBC significant for leukocytosis at 12.9. Lactic acid 2.8. Will give 1L IVFs.   Patient handed off to Charlann Lange at shift change who fill follow-up on labs/CT scan, reassess patient, and determine disposition. If CT is negative, patient may be discharged home with pain medication and GI follow-up.  Final Clinical Impression(s) / ED Diagnoses Final diagnoses:  None    Rx / DC Orders ED Discharge Orders    None       Karie Kirks 11/25/19 0004    Ezequiel Essex, MD 11/25/19 [phone]

## 2019-11-24 NOTE — ED Notes (Signed)
Patient transported to CT 

## 2019-11-24 NOTE — ED Notes (Signed)
Pt in CT.

## 2019-11-25 ENCOUNTER — Emergency Department (HOSPITAL_COMMUNITY): Payer: No Typology Code available for payment source

## 2019-11-25 ENCOUNTER — Other Ambulatory Visit: Payer: Self-pay

## 2019-11-25 ENCOUNTER — Encounter (HOSPITAL_COMMUNITY): Payer: Self-pay

## 2019-11-25 DIAGNOSIS — K5792 Diverticulitis of intestine, part unspecified, without perforation or abscess without bleeding: Secondary | ICD-10-CM | POA: Diagnosis present

## 2019-11-25 DIAGNOSIS — K5720 Diverticulitis of large intestine with perforation and abscess without bleeding: Secondary | ICD-10-CM | POA: Diagnosis present

## 2019-11-25 LAB — CBC
HCT: 36.7 % (ref 36.0–46.0)
Hemoglobin: 11.4 g/dL — ABNORMAL LOW (ref 12.0–15.0)
MCH: 26.1 pg (ref 26.0–34.0)
MCHC: 31.1 g/dL (ref 30.0–36.0)
MCV: 84.2 fL (ref 80.0–100.0)
Platelets: 306 10*3/uL (ref 150–400)
RBC: 4.36 MIL/uL (ref 3.87–5.11)
RDW: 13.2 % (ref 11.5–15.5)
WBC: 16.8 10*3/uL — ABNORMAL HIGH (ref 4.0–10.5)
nRBC: 0 % (ref 0.0–0.2)

## 2019-11-25 LAB — URINALYSIS, ROUTINE W REFLEX MICROSCOPIC
Bacteria, UA: NONE SEEN
Bilirubin Urine: NEGATIVE
Glucose, UA: NEGATIVE mg/dL
Hgb urine dipstick: NEGATIVE
Ketones, ur: NEGATIVE mg/dL
Nitrite: NEGATIVE
Protein, ur: NEGATIVE mg/dL
Specific Gravity, Urine: >1.046 — ABNORMAL HIGH (ref 1.005–1.030)
pH: 5 (ref 5.0–8.0)

## 2019-11-25 LAB — TROPONIN I (HIGH SENSITIVITY)
Troponin I (High Sensitivity): <2 ng/L (ref <18)
Troponin I (High Sensitivity): <2 ng/L (ref <18)

## 2019-11-25 LAB — BASIC METABOLIC PANEL
Anion gap: 9 (ref 5–15)
BUN: 19 mg/dL (ref 8–23)
CO2: 21 mmol/L — ABNORMAL LOW (ref 22–32)
Calcium: 8 mg/dL — ABNORMAL LOW (ref 8.9–10.3)
Chloride: 105 mmol/L (ref 98–111)
Creatinine, Ser: 0.63 mg/dL (ref 0.44–1.00)
GFR calc Af Amer: >60 mL/min (ref >60)
GFR calc non Af Amer: >60 mL/min (ref >60)
Glucose, Bld: 141 mg/dL — ABNORMAL HIGH (ref 70–99)
Potassium: 3.6 mmol/L (ref 3.5–5.1)
Sodium: 135 mmol/L (ref 135–145)

## 2019-11-25 LAB — LACTIC ACID, PLASMA: Lactic Acid, Venous: 2.2 mmol/L (ref 0.5–1.9)

## 2019-11-25 LAB — HIV ANTIBODY (ROUTINE TESTING W REFLEX): HIV Screen 4th Generation wRfx: NONREACTIVE

## 2019-11-25 MED ORDER — ONDANSETRON HCL 4 MG/2ML IJ SOLN: 4.0000 mg | Freq: Four times a day (QID) | INTRAMUSCULAR | Status: DC | PRN

## 2019-11-25 MED ORDER — ENOXAPARIN SODIUM 40 MG/0.4ML ~~LOC~~ SOLN
40.0000 mg | SUBCUTANEOUS | Status: DC
  Administered 2019-11-25 – 2019-11-29 (×5): 40 mg via SUBCUTANEOUS
  Filled 2019-11-25 (×5): qty 0.4

## 2019-11-25 MED ORDER — PIPERACILLIN-TAZOBACTAM 3.375 G IVPB 30 MIN
3.3750 g | Freq: Once | INTRAVENOUS | Status: AC
  Administered 2019-11-25: 3.375 g via INTRAVENOUS
  Filled 2019-11-25: qty 50

## 2019-11-25 MED ORDER — PANTOPRAZOLE SODIUM 40 MG IV SOLR
40.0000 mg | Freq: Every day | INTRAVENOUS | Status: DC
  Administered 2019-11-25 – 2019-11-27 (×3): 40 mg via INTRAVENOUS
  Filled 2019-11-25 (×4): qty 40

## 2019-11-25 MED ORDER — LOSARTAN POTASSIUM 50 MG PO TABS
50.0000 mg | ORAL_TABLET | Freq: Every day | ORAL | Status: DC
  Administered 2019-11-25 – 2019-11-27 (×3): 50 mg via ORAL
  Filled 2019-11-25 (×4): qty 1

## 2019-11-25 MED ORDER — HYDROMORPHONE HCL 1 MG/ML IJ SOLN
0.5000 mg | INTRAMUSCULAR | Status: DC | PRN
  Administered 2019-11-25: 0.5 mg via INTRAVENOUS
  Filled 2019-11-25: qty 0.5

## 2019-11-25 MED ORDER — DIPHENHYDRAMINE HCL 50 MG/ML IJ SOLN: 12.5000 mg | Freq: Four times a day (QID) | INTRAMUSCULAR | Status: DC | PRN

## 2019-11-25 MED ORDER — OXYCODONE HCL 5 MG PO TABS
5.0000 mg | ORAL_TABLET | Freq: Four times a day (QID) | ORAL | Status: DC | PRN
  Administered 2019-11-25: 5 mg via ORAL
  Filled 2019-11-25: qty 1

## 2019-11-25 MED ORDER — ONDANSETRON 4 MG PO TBDP
4.0000 mg | ORAL_TABLET | Freq: Four times a day (QID) | ORAL | Status: DC | PRN
  Administered 2019-11-27: 4 mg via ORAL
  Filled 2019-11-25: qty 1

## 2019-11-25 MED ORDER — IOHEXOL 300 MG/ML  SOLN
100.0000 mL | Freq: Once | INTRAMUSCULAR | Status: AC | PRN
  Administered 2019-11-25: 100 mL via INTRAVENOUS

## 2019-11-25 MED ORDER — PIPERACILLIN-TAZOBACTAM 3.375 G IVPB
3.3750 g | Freq: Three times a day (TID) | INTRAVENOUS | Status: DC
  Administered 2019-11-25 – 2019-11-28 (×9): 3.375 g via INTRAVENOUS
  Filled 2019-11-25 (×10): qty 50

## 2019-11-25 MED ORDER — TRAMADOL HCL 50 MG PO TABS
50.0000 mg | ORAL_TABLET | Freq: Four times a day (QID) | ORAL | Status: DC | PRN
  Administered 2019-11-25 – 2019-11-27 (×4): 50 mg via ORAL
  Filled 2019-11-25 (×4): qty 1

## 2019-11-25 MED ORDER — ACETAMINOPHEN 500 MG PO TABS
1000.0000 mg | ORAL_TABLET | Freq: Four times a day (QID) | ORAL | Status: DC
  Administered 2019-11-25 – 2019-11-28 (×11): 1000 mg via ORAL
  Filled 2019-11-25 (×12): qty 2

## 2019-11-25 MED ORDER — TRAZODONE HCL 50 MG PO TABS: 25.0000 mg | ORAL_TABLET | Freq: Every evening | ORAL | Status: DC | PRN

## 2019-11-25 MED ORDER — SODIUM CHLORIDE 0.9 % IV SOLN: INTRAVENOUS | Status: DC

## 2019-11-25 MED ORDER — SODIUM CHLORIDE (PF) 0.9 % IJ SOLN
INTRAMUSCULAR | Status: AC
  Filled 2019-11-25: qty 50

## 2019-11-25 MED ORDER — HYDRALAZINE HCL 20 MG/ML IJ SOLN
10.0000 mg | INTRAMUSCULAR | Status: DC | PRN
  Administered 2019-11-27: 10 mg via INTRAVENOUS
  Filled 2019-11-25: qty 1

## 2019-11-25 MED ORDER — METOPROLOL TARTRATE 5 MG/5ML IV SOLN: 5.0000 mg | Freq: Four times a day (QID) | INTRAVENOUS | Status: DC | PRN

## 2019-11-25 MED ORDER — DIPHENHYDRAMINE HCL 12.5 MG/5ML PO ELIX: 12.5000 mg | ORAL_SOLUTION | Freq: Four times a day (QID) | ORAL | Status: DC | PRN

## 2019-11-25 NOTE — Progress Notes (Signed)
Subjective: Pt feels better this am.   No further nausea.  No flatus  Objective: Vital signs in last 24 hours: Temp:  [98.1 F (36.7 C)-99.6 F (37.6 C)] 99.1 F (37.3 C) (02/15 0923) Pulse Rate:  [97-107] 102 (02/15 0923) Resp:  [16-27] 16 (02/15 0529) BP: (132-161)/(65-85) 148/72 (02/15 0923) SpO2:  [94 %-98 %] 94 % (02/15 0923) Weight:  [70.3 kg] 70.3 kg (02/15 0400)   Intake/Output from previous day: 02/14 0701 - 02/15 0700 In: 1043.8 [IV Piggyback:1043.8] Out: -  Intake/Output this shift: Total I/O In: 924.4 [P.O.:240; I.V.:684.4] Out: 0    General appearance: alert and cooperative GI: soft, tender suprapubically   Lab Results:  Recent Labs    11/24/19 2133 11/25/19 0413  WBC 12.9* 16.8*  HGB 11.9* 11.4*  HCT 38.0 36.7  PLT 334 306   BMET Recent Labs    11/24/19 2133 11/25/19 0413  NA 131* 135  K 3.5 3.6  CL 102 105  CO2 19* 21*  GLUCOSE 128* 141*  BUN 20 19  CREATININE 0.53 0.63  CALCIUM 8.2* 8.0*   PT/INR No results for input(s): LABPROT, INR in the last 72 hours. ABG No results for input(s): PHART, HCO3 in the last 72 hours.  Invalid input(s): PCO2, PO2  MEDS, Scheduled . acetaminophen  1,000 mg Oral Q6H  . enoxaparin (LOVENOX) injection  40 mg Subcutaneous Q24H  . losartan  50 mg Oral Daily  . pantoprazole (PROTONIX) IV  40 mg Intravenous QHS  . sodium chloride (PF)        Studies/Results: CT ABDOMEN PELVIS W CONTRAST  Result Date: 11/25/2019 CLINICAL DATA:  Right lower quadrant abdominal pain. EXAM: CT ABDOMEN AND PELVIS WITH CONTRAST TECHNIQUE: Multidetector CT imaging of the abdomen and pelvis was performed using the standard protocol following bolus administration of intravenous contrast. CONTRAST:  157mL OMNIPAQUE IOHEXOL 300 MG/ML  SOLN COMPARISON:  None. FINDINGS: Lower chest: The lung bases are clear. The heart size is normal. Hepatobiliary: The liver is normal. Cholelithiasis without acute inflammation.There is no biliary  ductal dilation. Pancreas: Normal contours without ductal dilatation. No peripancreatic fluid collection. Spleen: No splenic laceration or hematoma. Adrenals/Urinary Tract: --Adrenal glands: No adrenal hemorrhage. --Right kidney/ureter: No hydronephrosis or perinephric hematoma. --Left kidney/ureter: No hydronephrosis or perinephric hematoma. --Urinary bladder: Unremarkable. Stomach/Bowel: --Stomach/Duodenum: No hiatal hernia or other gastric abnormality. Normal duodenal course and caliber. --Small bowel: No dilatation or inflammation. --Colon: There are extensive inflammatory changes about the distal descending colon. There is no well-formed drainable fluid collection. There are pockets of pneumoperitoneum scattered throughout the abdomen. --Appendix: Normal. Vascular/Lymphatic: Normal course and caliber of the major abdominal vessels. --No retroperitoneal lymphadenopathy. --No mesenteric lymphadenopathy. --No pelvic or inguinal lymphadenopathy. Reproductive: There is a moderate amount of free fluid in the patient's pelvis. Other: There is scattered pockets of free air throughout abdomen. There is a small fat containing periumbilical hernia. Musculoskeletal. No acute displaced fractures. IMPRESSION: 1. Overall findings are most consistent with perforated diverticulitis involving the distal descending colon. There are scattered pockets of free air throughout the abdomen. There is a moderate amount of free fluid in the patient's pelvis without evidence for well-formed abscess. 2. Normal appendix in the right lower quadrant. Electronically Signed   By: Constance Holster M.D.   On: 11/25/2019 01:19   DG Chest Portable 1 View  Result Date: 11/24/2019 CLINICAL DATA:  Lower abdominal pain chills EXAM: PORTABLE CHEST 1 VIEW COMPARISON:  03/20/2019 FINDINGS: Calcified left upper lobe lung nodule consistent with granuloma.  Surgical plate and fixating screws in the proximal right humerus. Low lung volumes. Mild  cardiomegaly. Patchy atelectasis at the left base. No pneumothorax. Aortic atherosclerosis. IMPRESSION: Low lung volumes with mild cardiomegaly and patchy atelectasis at the left base. Electronically Signed   By: Donavan Foil M.D.   On: 11/24/2019 22:53   DG Abd Portable 2 Views  Result Date: 11/24/2019 CLINICAL DATA:  Lower abdominal pain and vomiting. Clinical concern for free air. EXAM: PORTABLE ABDOMEN - 2 VIEW COMPARISON:  None. FINDINGS: Supine and right decubitus views of the abdomen. No evidence of free intra-abdominal air. r few prominent air-filled small bowel loops in the central abdomen, nonspecific. Air in stool throughout the colon. Oblong radiopaque densities projecting over bowel favor ingested pills. No definite radiopaque calculi. No acute osseous abnormalities are seen. IMPRESSION: 1. No evidence of free intra-abdominal air. 2. Few prominent air-filled small bowel loops in the central abdomen, nonspecific. Electronically Signed   By: Keith Rake M.D.   On: 11/24/2019 22:59    Assessment: s/p  Patient Active Problem List   Diagnosis Date Noted  . Diverticulitis of colon with perforation 11/25/2019  . Perforation of sigmoid colon due to diverticulitis 11/25/2019  . essential hypertension 10/17/2017    Improving clinically  Plan: Advance diet to clears Cont IV abx and IV fluids Recheck wbc in AM   LOS: 0 days     .Rosario Adie, [facility], [place]    11/25/2019 10:21 AM

## 2019-11-25 NOTE — Progress Notes (Signed)
   11/25/19 1954  MEWS Score  Temp 98.4 F (36.9 C)  BP (!) 153/74  Pulse Rate 96  Resp (!) 28  SpO2 92 %  O2 Device Room Air  MEWS Score  MEWS Temp 0  MEWS Systolic 0  MEWS Pulse 0  MEWS RR 2  MEWS LOC 0  MEWS Score 2  MEWS Score Color Yellow  MEWS Assessment  Is this an acute change? Yes  MEWS guidelines implemented *See Row Information* Yellow  Rapid Response Notification  Name of Rapid Response RN Notified NA   Provider Notification  Provider Name/Title NA   Pt RR elevated. O2 sats ok, RN applied O2 for comfort and pt medicated for pain. Pt agreeable/ appreciative for help. Will monitor pt following YELLOW MEWS guidelines.

## 2019-11-25 NOTE — ED Notes (Signed)
Will collect second lactic following the NS bolus.

## 2019-11-25 NOTE — ED Notes (Signed)
Patient transported to CT 

## 2019-11-25 NOTE — H&P (Signed)
Surgical H&P  Chief Complaint: Abdominal pain  HPI: [Via video interpreter] very nice 69 year old woman originally from [place] who developed acute onset bilateral lower abdominal pain around 6:00 in the evening.  She had had intermittent sharp pains in the left abdomen over the course of the last month but did not pay much attention as this was not severe.  The pain she developed today similar to a severe cramp was associated with nausea and nonbloody nonbilious emesis, no fever but chills were noted.  Denies melena or hematochezia.  The pain was aggravated by movement and she tried a couple over-the-counter pain medications without relief.  Since arrival in the emergency room, she reports that the pain is significantly better but is still present.  No longer feeling nauseated.  She has never had any prior abdominal surgery.  She has a history of hypertension, hyperlipidemia, hepatosteatosis, and a gastric ulcer that sounds like it was treated endoscopically for bleeding, as well as history of breast cancer.  She has not had any prior colonoscopy.  No Known Allergies  Past Medical History:  Diagnosis Date  . Hx of breast cancer   . Hx of gastric ulcer   . Hypertension     Past Surgical History:  Procedure Laterality Date  . HUMERUS FRACTURE SURGERY    . left lumpectomy    . MASS EXCISION Left    left axilla    Family History  Problem Relation Age of Onset  . Diabetes Mother   . Breast cancer Sister     Social History   Socioeconomic History  . Marital status: Single    Spouse name: Not on file  . Number of children: 2  . Years of education: Not on file  . Highest education level: 10th grade  Occupational History  . Not on file  Tobacco Use  . Smoking status: Former Research scientist (life sciences)  . Smokeless tobacco: Never Used  Substance and Sexual Activity  . Alcohol use: Not Currently  . Drug use: No  . Sexual activity: Not on file  Other Topics Concern  . Not on file  Social  History Narrative  . Not on file   Social Determinants of Health   Financial Resource Strain:   . Difficulty of Paying Living Expenses: Not on file  Food Insecurity:   . Worried About Charity fundraiser in the Last Year: Not on file  . Ran Out of Food in the Last Year: Not on file  Transportation Needs: No Transportation Needs  . Lack of Transportation (Medical): No  . Lack of Transportation (Non-Medical): No  Physical Activity:   . Days of Exercise per Week: Not on file  . Minutes of Exercise per Session: Not on file  Stress:   . Feeling of Stress : Not on file  Social Connections:   . Frequency of Communication with Friends and Family: Not on file  . Frequency of Social Gatherings with Friends and Family: Not on file  . Attends Religious Services: Not on file  . Active Member of Clubs or Organizations: Not on file  . Attends Archivist Meetings: Not on file  . Marital Status: Not on file    No current facility-administered medications on file prior to encounter.   Current Outpatient Medications on File Prior to Encounter  Medication Sig Dispense Refill  . acetaminophen (TYLENOL) 500 MG tablet Take 500 mg by mouth every 6 (six) hours as needed for mild pain.    Marland Kitchen losartan (COZAAR) 50  MG tablet Take 1 tablet (50 mg total) by mouth daily. 90 tablet 1  . rosuvastatin (CRESTOR) 10 MG tablet Take 1 tablet (10 mg total) by mouth daily. 90 tablet 1  . traZODone (DESYREL) 50 MG tablet Take 0.5-1 tablets (25-50 mg total) by mouth at bedtime as needed for sleep. 90 tablet 1  . Vitamin D, Ergocalciferol, (DRISDOL) 1.25 MG (50000 UT) CAPS capsule Take 1 capsule (50,000 Units total) by mouth every 7 (seven) days. (Patient not taking: Reported on 03/20/2019) 12 capsule 1    Review of Systems: a complete, 10pt review of systems was completed with pertinent positives and negatives as documented in the HPI  Physical Exam: Vitals:   11/24/19 2130 11/25/19 0145  BP: (!) 161/85 (!)  155/82  Pulse: 97 (!) 107  Resp: 18 (!) 27  Temp: 98.1 F (36.7 C)   SpO2: 95% 94%   Gen: A&Ox3, no distress  Eyes: lids and conjunctivae normal, no icterus. Pupils equally round and reactive to light.  Respiratory: supple without mass or thyromegaly Chest: respiratory effort is normal at rest, she becomes anxious and slightly tachypneic during our conversation about the disease process. No crepitus or tenderness on palpation of the chest. Breath sounds equal.  Cardiovascular: RRR with palpable distal pulses, no pedal edema Gastrointestinal: soft, nondistended, tender in the lower abdomen with some voluntary guarding.  No mass, hepatomegaly or splenomegaly. No hernia. Muscoloskeletal: no clubbing or cyanosis of the fingers.  Strength is symmetrical throughout.  Range of motion of bilateral upper and lower extremities normal with pain, crepitation or contracture. Neuro: cranial nerves grossly intact.  Sensation intact to light touch diffusely. Psych: appropriate mood and affect, normal insight/judgment intact  Skin: warm and dry   CBC Latest Ref Rng & Units 11/24/2019 09/25/2019 03/20/2019  WBC 4.0 - 10.5 K/uL 12.9(H) 6.7 4.3  Hemoglobin 12.0 - 15.0 g/dL 11.9(L) 11.5 12.4  Hematocrit 36.0 - 46.0 % 38.0 34.2 39.5  Platelets 150 - 400 K/uL 334 411 298    CMP Latest Ref Rng & Units 11/24/2019 09/25/2019 03/20/2019  Glucose 70 - 99 mg/dL 128(H) 93 100(H)  BUN 8 - 23 mg/dL 20 17 10   Creatinine 0.44 - 1.00 mg/dL 0.53 0.69 0.60  Sodium 135 - 145 mmol/L 131(L) 137 134(L)  Potassium 3.5 - 5.1 mmol/L 3.5 5.2 3.5  Chloride 98 - 111 mmol/L 102 100 101  CO2 22 - 32 mmol/L 19(L) 25 23  Calcium 8.9 - 10.3 mg/dL 8.2(L) 9.9 8.3(L)  Total Protein 6.5 - 8.1 g/dL 7.2 7.3 8.2(H)  Total Bilirubin 0.3 - 1.2 mg/dL 0.4 0.3 0.1(L)  Alkaline Phos 38 - 126 U/L 88 142(H) 128(H)  AST 15 - 41 U/L 21 19 25   ALT 0 - 44 U/L 16 12 15     No results found for: INR, PROTIME  Imaging: CT ABDOMEN PELVIS W  CONTRAST  Result Date: 11/25/2019 CLINICAL DATA:  Right lower quadrant abdominal pain. EXAM: CT ABDOMEN AND PELVIS WITH CONTRAST TECHNIQUE: Multidetector CT imaging of the abdomen and pelvis was performed using the standard protocol following bolus administration of intravenous contrast. CONTRAST:  133mL OMNIPAQUE IOHEXOL 300 MG/ML  SOLN COMPARISON:  None. FINDINGS: Lower chest: The lung bases are clear. The heart size is normal. Hepatobiliary: The liver is normal. Cholelithiasis without acute inflammation.There is no biliary ductal dilation. Pancreas: Normal contours without ductal dilatation. No peripancreatic fluid collection. Spleen: No splenic laceration or hematoma. Adrenals/Urinary Tract: --Adrenal glands: No adrenal hemorrhage. --Right kidney/ureter: No hydronephrosis or perinephric  hematoma. --Left kidney/ureter: No hydronephrosis or perinephric hematoma. --Urinary bladder: Unremarkable. Stomach/Bowel: --Stomach/Duodenum: No hiatal hernia or other gastric abnormality. Normal duodenal course and caliber. --Small bowel: No dilatation or inflammation. --Colon: There are extensive inflammatory changes about the distal descending colon. There is no well-formed drainable fluid collection. There are pockets of pneumoperitoneum scattered throughout the abdomen. --Appendix: Normal. Vascular/Lymphatic: Normal course and caliber of the major abdominal vessels. --No retroperitoneal lymphadenopathy. --No mesenteric lymphadenopathy. --No pelvic or inguinal lymphadenopathy. Reproductive: There is a moderate amount of free fluid in the patient's pelvis. Other: There is scattered pockets of free air throughout abdomen. There is a small fat containing periumbilical hernia. Musculoskeletal. No acute displaced fractures. IMPRESSION: 1. Overall findings are most consistent with perforated diverticulitis involving the distal descending colon. There are scattered pockets of free air throughout the abdomen. There is a moderate  amount of free fluid in the patient's pelvis without evidence for well-formed abscess. 2. Normal appendix in the right lower quadrant. Electronically Signed   By: Constance Holster M.D.   On: 11/25/2019 01:19   DG Chest Portable 1 View  Result Date: 11/24/2019 CLINICAL DATA:  Lower abdominal pain chills EXAM: PORTABLE CHEST 1 VIEW COMPARISON:  03/20/2019 FINDINGS: Calcified left upper lobe lung nodule consistent with granuloma. Surgical plate and fixating screws in the proximal right humerus. Low lung volumes. Mild cardiomegaly. Patchy atelectasis at the left base. No pneumothorax. Aortic atherosclerosis. IMPRESSION: Low lung volumes with mild cardiomegaly and patchy atelectasis at the left base. Electronically Signed   By: Donavan Foil M.D.   On: 11/24/2019 22:53   DG Abd Portable 2 Views  Result Date: 11/24/2019 CLINICAL DATA:  Lower abdominal pain and vomiting. Clinical concern for free air. EXAM: PORTABLE ABDOMEN - 2 VIEW COMPARISON:  None. FINDINGS: Supine and right decubitus views of the abdomen. No evidence of free intra-abdominal air. r few prominent air-filled small bowel loops in the central abdomen, nonspecific. Air in stool throughout the colon. Oblong radiopaque densities projecting over bowel favor ingested pills. No definite radiopaque calculi. No acute osseous abnormalities are seen. IMPRESSION: 1. No evidence of free intra-abdominal air. 2. Few prominent air-filled small bowel loops in the central abdomen, nonspecific. Electronically Signed   By: Keith Rake M.D.   On: 11/24/2019 22:59     A/P: 69 year old woman with perforated diverticulitis.  I have reviewed her CT scan-the small pockets of free air are actually in the upper abdomen and along the distal esophagus.  I spoke with Dr. Nyoka Cowden, the radiologist, who feels certain that this is tracking from the abdomen and is not related to esophageal pathology as there is no inflammatory change about the esophagus on his review.  Nor  is she complaining of upper abdominal or chest pain.  There is moderate free fluid in the pelvis but no organized abscess.  She has had significant symptomatic improvement with treatment in the ER.  At this point I recommend admission for IV antibiotics, bowel rest, serial exams/labs.  I discussed the disease process with her and her daughter by the interpreter and discussed the different possible outcomes including improvement/resolution with medical therapy, development of abscess requiring percutaneous drainage, or failure to respond and worsening clinical status was may require urgent operation this hospitalization and possible colostomy.   At the time of our conversation, the patient states that she does not want to have surgery at any point.  She is also extremely frightened to be admitted to the hospital and a long conversation and extensive  counseling was required to convince her to accept admission to the hospital.  She agreed to 1 night.  I discussed with her that we will reassess her status frequently and as soon as it is safe for her to be discharged home we will do so; although it would be [place] for her to go home at this point or probably tomorrow emphasized that no one is going to force her to do anything.  I made it clear to the patient the significance of this disease process and that if improperly treated this can result in sepsis and death, she expressed understanding.  ADMISSION STATUS: Observation  Patient Active Problem List   Diagnosis Date Noted  . Diverticulitis of colon with perforation 11/25/2019  . Perforation of sigmoid colon due to diverticulitis 11/25/2019  . essential hypertension 10/17/2017       Romana Juniper, MD [facility]  See AMION to contact appropriate on-call provider

## 2019-11-25 NOTE — ED Notes (Signed)
Surgical MD at bedside

## 2019-11-25 NOTE — ED Provider Notes (Signed)
Patient signed out at end of shift by Charmaine Downs, PA-C, pending CT scan in evaluation of RLQ, LLQ pain today. No fever, +chills. She has had vomiting.   On re-evaluation, daughter is at bedside. Patient continues to be comfortable.   CT shows perforated diverticulitis in the distal descending colon with scattered pockets of free air throughout the abdomen. Also, moderate free fluid in pelvis without identifiable abscess.   There findings were discussed with Dr. Kae Heller, surgery, who advises she will admit the patient to her service. COVID, IV Zosyn ordered.   Via interpreter, the patient and daughter were updated on the results of CT and the plan for admission. All questions answered. VSS.   Charlann Lange, PA-C 11/25/19 0201    Ezequiel Essex, MD 11/25/19 [phone]

## 2019-11-26 LAB — BASIC METABOLIC PANEL
Anion gap: 7 (ref 5–15)
BUN: 23 mg/dL (ref 8–23)
CO2: 23 mmol/L (ref 22–32)
Calcium: 7.4 mg/dL — ABNORMAL LOW (ref 8.9–10.3)
Chloride: 107 mmol/L (ref 98–111)
Creatinine, Ser: 0.68 mg/dL (ref 0.44–1.00)
GFR calc Af Amer: >60 mL/min (ref >60)
GFR calc non Af Amer: >60 mL/min (ref >60)
Glucose, Bld: 93 mg/dL (ref 70–99)
Potassium: 3.5 mmol/L (ref 3.5–5.1)
Sodium: 137 mmol/L (ref 135–145)

## 2019-11-26 LAB — CBC
HCT: 31.1 % — ABNORMAL LOW (ref 36.0–46.0)
Hemoglobin: 9.6 g/dL — ABNORMAL LOW (ref 12.0–15.0)
MCH: 26.5 pg (ref 26.0–34.0)
MCHC: 30.9 g/dL (ref 30.0–36.0)
MCV: 85.9 fL (ref 80.0–100.0)
Platelets: 260 10*3/uL (ref 150–400)
RBC: 3.62 MIL/uL — ABNORMAL LOW (ref 3.87–5.11)
RDW: 13.6 % (ref 11.5–15.5)
WBC: 15.7 10*3/uL — ABNORMAL HIGH (ref 4.0–10.5)
nRBC: 0 % (ref 0.0–0.2)

## 2019-11-26 LAB — RESPIRATORY PANEL BY RT PCR (FLU A&B, COVID)
Influenza A by PCR: NEGATIVE
Influenza B by PCR: NEGATIVE
SARS Coronavirus 2 by RT PCR: NEGATIVE

## 2019-11-26 LAB — LACTIC ACID, PLASMA: Lactic Acid, Venous: 1.6 mmol/L (ref 0.5–1.9)

## 2019-11-26 MED ORDER — DOCUSATE SODIUM 100 MG PO CAPS
100.0000 mg | ORAL_CAPSULE | Freq: Two times a day (BID) | ORAL | Status: DC
  Administered 2019-11-26 – 2019-11-29 (×7): 100 mg via ORAL
  Filled 2019-11-26 (×7): qty 1

## 2019-11-26 NOTE — Progress Notes (Addendum)
   11/26/19 1611  MEWS Score  Temp 99.4 F (37.4 C)  BP (!) 155/73  Pulse Rate 98  Resp (!) 32  SpO2 (!) 89 %  O2 Device Room Air  MEWS Score  MEWS Temp 0  MEWS Systolic 0  MEWS Pulse 0  MEWS RR 2  MEWS LOC 0  MEWS Score 2  MEWS Score Color Yellow  MEWS Assessment  Is this an acute change? Yes  MEWS guidelines implemented *See Row Information* Yellow  Rapid Response Notification  Name of Rapid Response RN Notified NA  Provider Notification  Provider Name/Title CCS MD on call  Date Provider Notified 11/26/19  Time Provider Notified 1654  Notification Type Page  Notification Reason Change in status  Response No new orders  Date of Provider Response 11/26/19   Yellow MEWS guidelines initiated. Oxygen applied to help patient with breathing. RN assessed patient. Patient denies having any pain, shortness of breath, or difficulty breathing. Will continue to monitor patient and follow MEWS guidelines.

## 2019-11-26 NOTE — Progress Notes (Signed)
[facility] Progress Note     Subjective: CC-  Feeling much better. Abdominal pain is less. Denies n/v. Passing flatus, no BM. WBC trending down 15.7, afebrile. Lactic acid normalized.  ROS: See above, otherwise other systems negative   Objective: Vital signs in last 24 hours: Temp:  [97.7 F (36.5 C)-99.1 F (37.3 C)] 97.9 F (36.6 C) (02/16 0752) Pulse Rate:  [80-97] 80 (02/16 0752) Resp:  [18-32] 22 (02/16 0752) BP: (130-155)/(58-76) 144/62 (02/16 0752) SpO2:  [92 %-96 %] 96 % (02/16 0752) Last BM Date: 11/23/19  Intake/Output from previous day: 02/15 0701 - 02/16 0700 In: 3267.3 [P.O.:600; I.V.:2567.4; IV Piggyback:99.9] Out: 0  Intake/Output this shift: Total I/O In: 120 [P.O.:120] Out: -   PE: Gen:  Alert, NAD, pleasant HEENT: EOM's intact, pupils equal and round Card:  RRR Pulm:  CTAB, no W/R/R, rate and effort normal Abd: Soft, ND, mild suprapubic/LLQ TTP without rebound or guarding, +BS, no HSM Skin: no rashes noted, warm and dry  Lab Results:  Recent Labs    11/25/19 0413 11/26/19 0503  WBC 16.8* 15.7*  HGB 11.4* 9.6*  HCT 36.7 31.1*  PLT 306 260   BMET Recent Labs    11/25/19 0413 11/26/19 0503  NA 135 137  K 3.6 3.5  CL 105 107  CO2 21* 23  GLUCOSE 141* 93  BUN 19 23  CREATININE 0.63 0.68  CALCIUM 8.0* 7.4*   PT/INR No results for input(s): LABPROT, INR in the last 72 hours. CMP     Component Value Date/Time   NA 137 11/26/2019 0503   NA 137 09/25/2019 1617   K 3.5 11/26/2019 0503   CL 107 11/26/2019 0503   CO2 23 11/26/2019 0503   GLUCOSE 93 11/26/2019 0503   BUN 23 11/26/2019 0503   BUN 17 09/25/2019 1617   CREATININE 0.68 11/26/2019 0503   CALCIUM 7.4 (L) 11/26/2019 0503   PROT 7.2 11/24/2019 2133   PROT 7.3 09/25/2019 1617   ALBUMIN 3.7 11/24/2019 2133   ALBUMIN 4.3 09/25/2019 1617   AST 21 11/24/2019 2133   ALT 16 11/24/2019 2133   ALKPHOS 88 11/24/2019 2133   BILITOT 0.4 11/24/2019 2133   BILITOT 0.3  09/25/2019 1617   GFRNONAA >60 11/26/2019 0503   GFRAA >60 11/26/2019 0503   Lipase     Component Value Date/Time   LIPASE 33 11/24/2019 2133       Studies/Results: CT ABDOMEN PELVIS W CONTRAST  Result Date: 11/25/2019 CLINICAL DATA:  Right lower quadrant abdominal pain. EXAM: CT ABDOMEN AND PELVIS WITH CONTRAST TECHNIQUE: Multidetector CT imaging of the abdomen and pelvis was performed using the standard protocol following bolus administration of intravenous contrast. CONTRAST:  1101mL OMNIPAQUE IOHEXOL 300 MG/ML  SOLN COMPARISON:  None. FINDINGS: Lower chest: The lung bases are clear. The heart size is normal. Hepatobiliary: The liver is normal. Cholelithiasis without acute inflammation.There is no biliary ductal dilation. Pancreas: Normal contours without ductal dilatation. No peripancreatic fluid collection. Spleen: No splenic laceration or hematoma. Adrenals/Urinary Tract: --Adrenal glands: No adrenal hemorrhage. --Right kidney/ureter: No hydronephrosis or perinephric hematoma. --Left kidney/ureter: No hydronephrosis or perinephric hematoma. --Urinary bladder: Unremarkable. Stomach/Bowel: --Stomach/Duodenum: No hiatal hernia or other gastric abnormality. Normal duodenal course and caliber. --Small bowel: No dilatation or inflammation. --Colon: There are extensive inflammatory changes about the distal descending colon. There is no well-formed drainable fluid collection. There are pockets of pneumoperitoneum scattered throughout the abdomen. --Appendix: Normal. Vascular/Lymphatic: Normal course and caliber of the major abdominal  vessels. --No retroperitoneal lymphadenopathy. --No mesenteric lymphadenopathy. --No pelvic or inguinal lymphadenopathy. Reproductive: There is a moderate amount of free fluid in the patient's pelvis. Other: There is scattered pockets of free air throughout abdomen. There is a small fat containing periumbilical hernia. Musculoskeletal. No acute displaced fractures.  IMPRESSION: 1. Overall findings are most consistent with perforated diverticulitis involving the distal descending colon. There are scattered pockets of free air throughout the abdomen. There is a moderate amount of free fluid in the patient's pelvis without evidence for well-formed abscess. 2. Normal appendix in the right lower quadrant. Electronically Signed   By: Constance Holster M.D.   On: 11/25/2019 01:19   DG Chest Portable 1 View  Result Date: 11/24/2019 CLINICAL DATA:  Lower abdominal pain chills EXAM: PORTABLE CHEST 1 VIEW COMPARISON:  03/20/2019 FINDINGS: Calcified left upper lobe lung nodule consistent with granuloma. Surgical plate and fixating screws in the proximal right humerus. Low lung volumes. Mild cardiomegaly. Patchy atelectasis at the left base. No pneumothorax. Aortic atherosclerosis. IMPRESSION: Low lung volumes with mild cardiomegaly and patchy atelectasis at the left base. Electronically Signed   By: Donavan Foil M.D.   On: 11/24/2019 22:53   DG Abd Portable 2 Views  Result Date: 11/24/2019 CLINICAL DATA:  Lower abdominal pain and vomiting. Clinical concern for free air. EXAM: PORTABLE ABDOMEN - 2 VIEW COMPARISON:  None. FINDINGS: Supine and right decubitus views of the abdomen. No evidence of free intra-abdominal air. r few prominent air-filled small bowel loops in the central abdomen, nonspecific. Air in stool throughout the colon. Oblong radiopaque densities projecting over bowel favor ingested pills. No definite radiopaque calculi. No acute osseous abnormalities are seen. IMPRESSION: 1. No evidence of free intra-abdominal air. 2. Few prominent air-filled small bowel loops in the central abdomen, nonspecific. Electronically Signed   By: Keith Rake M.D.   On: 11/24/2019 22:59    Anti-infectives: Anti-infectives (From admission, onward)   Start     Dose/Rate Route Frequency Ordered Stop   11/25/19 1400  piperacillin-tazobactam (ZOSYN) IVPB 3.375 g     3.375 g 12.5  mL/hr over 240 Minutes Intravenous Every 8 hours 11/25/19 0241     11/25/19 0130  piperacillin-tazobactam (ZOSYN) IVPB 3.375 g     3.375 g 100 mL/hr over 30 Minutes Intravenous  Once 11/25/19 0126 11/25/19 0313       Assessment/Plan HTN - home meds HLD H/o breast cancer  Perforated diverticulitis - CT scan showed perforated diverticulitis involving the distal descending colon, scattered pockets of free air throughout the abdomen, moderate amount of free fluid in the pelvis without evidence for well-formed absces - no prior colonoscopy  ID - zosyn 2/15>> FEN - IVF, CLD VTE - SCDs, lovenox Foley - none Follow up - TBD  Plan - Pain improving and WBC trending down. Continue IV zosyn and clear liquids. Encourage mobilization. Repeat labs in AM.   LOS: 1 day    Wellington Hampshire, [facility] 11/26/2019, 11:50 AM Please see Amion for pager number during day hours 7:00am-4:30pm

## 2019-11-27 ENCOUNTER — Inpatient Hospital Stay (HOSPITAL_COMMUNITY): Payer: No Typology Code available for payment source

## 2019-11-27 LAB — BASIC METABOLIC PANEL
Anion gap: 8 (ref 5–15)
BUN: 12 mg/dL (ref 8–23)
CO2: 20 mmol/L — ABNORMAL LOW (ref 22–32)
Calcium: 7.3 mg/dL — ABNORMAL LOW (ref 8.9–10.3)
Chloride: 108 mmol/L (ref 98–111)
Creatinine, Ser: 0.56 mg/dL (ref 0.44–1.00)
GFR calc Af Amer: >60 mL/min (ref >60)
GFR calc non Af Amer: >60 mL/min (ref >60)
Glucose, Bld: 80 mg/dL (ref 70–99)
Potassium: 2.5 mmol/L — CL (ref 3.5–5.1)
Sodium: 136 mmol/L (ref 135–145)

## 2019-11-27 LAB — CBC
HCT: 29.3 % — ABNORMAL LOW (ref 36.0–46.0)
Hemoglobin: 9 g/dL — ABNORMAL LOW (ref 12.0–15.0)
MCH: 26.1 pg (ref 26.0–34.0)
MCHC: 30.7 g/dL (ref 30.0–36.0)
MCV: 84.9 fL (ref 80.0–100.0)
Platelets: 250 10*3/uL (ref 150–400)
RBC: 3.45 MIL/uL — ABNORMAL LOW (ref 3.87–5.11)
RDW: 13.6 % (ref 11.5–15.5)
WBC: 12.6 10*3/uL — ABNORMAL HIGH (ref 4.0–10.5)
nRBC: 0 % (ref 0.0–0.2)

## 2019-11-27 LAB — MAGNESIUM: Magnesium: 1.6 mg/dL — ABNORMAL LOW (ref 1.7–2.4)

## 2019-11-27 LAB — PHOSPHORUS: Phosphorus: 1.9 mg/dL — ABNORMAL LOW (ref 2.5–4.6)

## 2019-11-27 MED ORDER — POTASSIUM CHLORIDE 10 MEQ/100ML IV SOLN
10.0000 meq | INTRAVENOUS | Status: AC
  Administered 2019-11-27 (×3): 10 meq via INTRAVENOUS
  Filled 2019-11-27 (×4): qty 100

## 2019-11-27 MED ORDER — MAGNESIUM SULFATE 2 GM/50ML IV SOLN
2.0000 g | Freq: Once | INTRAVENOUS | Status: AC
  Administered 2019-11-27: 2 g via INTRAVENOUS
  Filled 2019-11-27: qty 50

## 2019-11-27 MED ORDER — POTASSIUM CHLORIDE CRYS ER 20 MEQ PO TBCR
40.0000 meq | EXTENDED_RELEASE_TABLET | Freq: Two times a day (BID) | ORAL | Status: AC
  Administered 2019-11-27 – 2019-11-28 (×4): 40 meq via ORAL
  Filled 2019-11-27 (×4): qty 2

## 2019-11-27 MED ORDER — FUROSEMIDE 10 MG/ML IJ SOLN
40.0000 mg | Freq: Once | INTRAMUSCULAR | Status: AC
  Administered 2019-11-27: 40 mg via INTRAVENOUS
  Filled 2019-11-27: qty 4

## 2019-11-27 NOTE — Progress Notes (Signed)
Called by nursing staff for patient having sob.   Gen:  Alert, NAD, pleasant Card:  RRR Pulm:  Distant breath sounds at bases. No rhonchi, rales or wheezing. Tachypnea. Abd: Soft, ND, reports no tenderness on exam but did wince with palpation to suprapubic abdomen. No r/r/g. +BS Ext:  Non-pitting pedal edema noted b/l  Plan: CXR w/ possible PNA early today. She is already on Zosyn. She is using flutter valve and IS. D/c IVF. I ordered IV lasix. Place on o2 by [place]. Monitor.

## 2019-11-27 NOTE — Progress Notes (Signed)
[facility] Progress Note     Subjective: CC-  Feeling better each day. Patient reports only mild lower abdominal pain. Denies n/v. Tolerating clear liquids. Passing flatus, no BM. Tachypneic at rest. Denies chest pain, shortness of breath, or cough. Daughter at bedside feels that she was wheezing earlier.  Objective: Vital signs in last 24 hours: Temp:  [97.6 F (36.4 C)-99.8 F (37.7 C)] 97.6 F (36.4 C) (02/17 0759) Pulse Rate:  [75-102] 75 (02/17 0759) Resp:  [24-44] 24 (02/17 0759) BP: (151-165)/(71-81) 156/71 (02/17 0759) SpO2:  [88 %-100 %] 97 % (02/17 0759) Last BM Date: 11/23/19  Intake/Output from previous day: 02/16 0701 - 02/17 0700 In: 300 [P.O.:300] Out: 300 [Urine:300] Intake/Output this shift: No intake/output data recorded.  PE: Gen:  Alert, NAD, pleasant HEENT: EOM's intact, pupils equal and round Card:  RRR Pulm:  CTAB, no W/R/R, mild tachypnea Abd: Soft, ND, nontender, +BS, no HSM Skin: no rashes noted, warm and dry  Lab Results:  Recent Labs    11/26/19 0503 11/27/19 0500  WBC 15.7* 12.6*  HGB 9.6* 9.0*  HCT 31.1* 29.3*  PLT 260 250   BMET Recent Labs    11/26/19 0503 11/27/19 0500  NA 137 136  K 3.5 2.5*  CL 107 108  CO2 23 20*  GLUCOSE 93 80  BUN 23 12  CREATININE 0.68 0.56  CALCIUM 7.4* 7.3*   PT/INR No results for input(s): LABPROT, INR in the last 72 hours. CMP     Component Value Date/Time   NA 136 11/27/2019 0500   NA 137 09/25/2019 1617   K 2.5 (LL) 11/27/2019 0500   CL 108 11/27/2019 0500   CO2 20 (L) 11/27/2019 0500   GLUCOSE 80 11/27/2019 0500   BUN 12 11/27/2019 0500   BUN 17 09/25/2019 1617   CREATININE 0.56 11/27/2019 0500   CALCIUM 7.3 (L) 11/27/2019 0500   PROT 7.2 11/24/2019 2133   PROT 7.3 09/25/2019 1617   ALBUMIN 3.7 11/24/2019 2133   ALBUMIN 4.3 09/25/2019 1617   AST 21 11/24/2019 2133   ALT 16 11/24/2019 2133   ALKPHOS 88 11/24/2019 2133   BILITOT 0.4 11/24/2019 2133   BILITOT  0.3 09/25/2019 1617   GFRNONAA >60 11/27/2019 0500   GFRAA >60 11/27/2019 0500   Lipase     Component Value Date/Time   LIPASE 33 11/24/2019 2133       Studies/Results: No results found.  Anti-infectives: Anti-infectives (From admission, onward)   Start     Dose/Rate Route Frequency Ordered Stop   11/25/19 1400  piperacillin-tazobactam (ZOSYN) IVPB 3.375 g     3.375 g 12.5 mL/hr over 240 Minutes Intravenous Every 8 hours 11/25/19 0241     11/25/19 0130  piperacillin-tazobactam (ZOSYN) IVPB 3.375 g     3.375 g 100 mL/hr over 30 Minutes Intravenous  Once 11/25/19 0126 11/25/19 0313       Assessment/Plan HTN - home meds HLD H/o breast cancer Hypokalemia/hypomagnesemia - replace  Perforated diverticulitis - CT scan showed perforated diverticulitis involving the distal descending colon, scattered pockets of free air throughout the abdomen, moderate amount of free fluid in the pelvis without evidence for well-formed absces - no prior colonoscopy  ID - zosyn 2/15>> FEN - decrease IVF, FLD VTE - SCDs, lovenox Foley - none Follow up - TBD  Plan - WBC trending down, abdominal pain improving. Advance to full liquids. Replace K and Mag. Continue IV zosyn and repeat labs in AM.  She is somewhat  tachypneic this morning. Check CXR and decrease IVF to 50cc/hr.   LOS: 2 days    [facility] 11/27/2019, 9:04 AM Please see Amion for pager number during day hours 7:00am-4:30pm

## 2019-11-28 DIAGNOSIS — I10 Essential (primary) hypertension: Secondary | ICD-10-CM

## 2019-11-28 DIAGNOSIS — J9601 Acute respiratory failure with hypoxia: Secondary | ICD-10-CM

## 2019-11-28 DIAGNOSIS — D649 Anemia, unspecified: Secondary | ICD-10-CM

## 2019-11-28 DIAGNOSIS — J189 Pneumonia, unspecified organism: Secondary | ICD-10-CM

## 2019-11-28 LAB — BASIC METABOLIC PANEL
Anion gap: 9 (ref 5–15)
BUN: 11 mg/dL (ref 8–23)
CO2: 23 mmol/L (ref 22–32)
Calcium: 7.9 mg/dL — ABNORMAL LOW (ref 8.9–10.3)
Chloride: 103 mmol/L (ref 98–111)
Creatinine, Ser: 0.59 mg/dL (ref 0.44–1.00)
GFR calc Af Amer: >60 mL/min (ref >60)
GFR calc non Af Amer: >60 mL/min (ref >60)
Glucose, Bld: 93 mg/dL (ref 70–99)
Potassium: 3.2 mmol/L — ABNORMAL LOW (ref 3.5–5.1)
Sodium: 135 mmol/L (ref 135–145)

## 2019-11-28 LAB — CBC
HCT: 30.5 % — ABNORMAL LOW (ref 36.0–46.0)
Hemoglobin: 9.8 g/dL — ABNORMAL LOW (ref 12.0–15.0)
MCH: 26 pg (ref 26.0–34.0)
MCHC: 32.1 g/dL (ref 30.0–36.0)
MCV: 80.9 fL (ref 80.0–100.0)
Platelets: 297 10*3/uL (ref 150–400)
RBC: 3.77 MIL/uL — ABNORMAL LOW (ref 3.87–5.11)
RDW: 13.3 % (ref 11.5–15.5)
WBC: 9.4 10*3/uL (ref 4.0–10.5)
nRBC: 0 % (ref 0.0–0.2)

## 2019-11-28 LAB — MAGNESIUM: Magnesium: 2 mg/dL (ref 1.7–2.4)

## 2019-11-28 MED ORDER — TRAMADOL HCL 50 MG PO TABS
50.0000 mg | ORAL_TABLET | Freq: Four times a day (QID) | ORAL | Status: DC | PRN
  Filled 2019-11-28: qty 1

## 2019-11-28 MED ORDER — ACETAMINOPHEN 325 MG PO TABS: 650.0000 mg | ORAL_TABLET | Freq: Four times a day (QID) | ORAL | Status: DC | PRN

## 2019-11-28 MED ORDER — PANTOPRAZOLE SODIUM 40 MG PO TBEC
40.0000 mg | DELAYED_RELEASE_TABLET | Freq: Every day | ORAL | Status: DC
  Administered 2019-11-28: 40 mg via ORAL
  Filled 2019-11-28: qty 1

## 2019-11-28 MED ORDER — AMOXICILLIN-POT CLAVULANATE 875-125 MG PO TABS
1.0000 | ORAL_TABLET | Freq: Two times a day (BID) | ORAL | Status: DC
  Administered 2019-11-28 – 2019-11-29 (×3): 1 via ORAL
  Filled 2019-11-28 (×3): qty 1

## 2019-11-28 MED ORDER — LOSARTAN POTASSIUM 50 MG PO TABS
100.0000 mg | ORAL_TABLET | Freq: Every day | ORAL | Status: DC
  Administered 2019-11-28 – 2019-11-29 (×2): 100 mg via ORAL
  Filled 2019-11-28 (×2): qty 2

## 2019-11-28 MED ORDER — MAGNESIUM SULFATE 2 GM/50ML IV SOLN
2.0000 g | Freq: Once | INTRAVENOUS | Status: AC
  Administered 2019-11-28: 2 g via INTRAVENOUS
  Filled 2019-11-28: qty 50

## 2019-11-28 MED ORDER — POLYETHYLENE GLYCOL 3350 17 G PO PACK
17.0000 g | PACK | Freq: Once | ORAL | Status: AC
  Administered 2019-11-28: 17 g via ORAL
  Filled 2019-11-28: qty 1

## 2019-11-28 MED ORDER — HYDROMORPHONE HCL 1 MG/ML IJ SOLN
0.5000 mg | INTRAMUSCULAR | Status: DC | PRN
  Administered 2019-11-28 – 2019-11-29 (×2): 0.5 mg via INTRAVENOUS
  Filled 2019-11-28 (×2): qty 0.5

## 2019-11-28 NOTE — Consult Note (Signed)
History and Physical    Maria Dominguez [id number] DOB: 11-02-1950 DOA: 11/24/2019  PCP: Maria Pounds, NP Patient coming from: Home  Reason for consult: Hypertension and pneumonia Requesting physician: Leighton Ruff, MD (General Surgery)  Interpreter (Spanish): Maria Dominguez [phone]  HPI: Maria Dominguez is a 69 y.o. female with medical history significant of hypertension, hyperlipidemia, breast cancer.  Patient initially presented secondary to abdominal pain and found to have acute diverticulitis.  Patient was managed on IV Zosyn with continued clinical improvement but developed some shortness of breath on 2/16.  At that time she did not have any associated cough or chest pain.  She was continued on IV Zosyn and given a flutter valve incentive spirometer to use to help with her symptoms.  Today she mentions that her initial symptoms have resolved.  She no longer has any dyspnea but did develop a slight productive cough today.  Review of Systems: Review of Systems  Constitutional: Negative for chills and fever.  Respiratory: Positive for cough, sputum production and shortness of breath.   Cardiovascular: Negative for chest pain.  Gastrointestinal: Positive for constipation. Negative for abdominal pain, diarrhea, nausea and vomiting.  All other systems reviewed and are negative.   Past Medical History:  Diagnosis Date  . Hx of breast cancer   . Hx of gastric ulcer   . Hypertension     Past Surgical History:  Procedure Laterality Date  . HUMERUS FRACTURE SURGERY    . left lumpectomy    . MASS EXCISION Left    left axilla     reports that she has quit smoking. She has never used smokeless tobacco. She reports previous alcohol use. She reports that she does not use drugs.  No Known Allergies  Family History  Problem Relation Age of Onset  . Diabetes Mother   . Breast cancer Sister    Prior to Admission medications   Medication Sig Start Date End Date Taking? Authorizing Provider   acetaminophen (TYLENOL) 500 MG tablet Take 500 mg by mouth every 6 (six) hours as needed for mild pain.   Yes [provider]  cholecalciferol (VITAMIN D3) 25 MCG (1000 UNIT) tablet Take 1,000 Units by mouth daily.   Yes [provider]  losartan (COZAAR) 50 MG tablet Take 1 tablet (50 mg total) by mouth daily. 09/25/19  Yes Freeman Caldron M, PA-C  rosuvastatin (CRESTOR) 10 MG tablet Take 1 tablet (10 mg total) by mouth daily. 09/25/19  Yes Freeman Caldron M, PA-C  traZODone (DESYREL) 50 MG tablet Take 0.5-1 tablets (25-50 mg total) by mouth at bedtime as needed for sleep. 09/25/19  Yes McClung, Angela M, PA-C  vitamin B-12 (CYANOCOBALAMIN) 1000 MCG tablet Take 1,000 mcg by mouth daily.   Yes [provider]  vitamin E (VITAMIN E) 180 MG (400 UNITS) capsule Take 400 Units by mouth daily.   Yes [provider]  Vitamin D, Ergocalciferol, (DRISDOL) 1.25 MG (50000 UT) CAPS capsule Take 1 capsule (50,000 Units total) by mouth every 7 (seven) days. Patient not taking: Reported on 03/20/2019 01/16/19   Maria Pounds, NP    Physical Exam:  Physical Exam Constitutional:      General: She is not in acute distress.    Appearance: She is well-developed. She is not diaphoretic.  HENT:     Mouth/Throat:     Mouth: Mucous membranes are moist.  Eyes:     Conjunctiva/sclera: Conjunctivae normal.     Pupils: Pupils are equal, round, and reactive to  light.  Cardiovascular:     Rate and Rhythm: Normal rate and regular rhythm.     Heart sounds: Normal heart sounds. No murmur.  Pulmonary:     Effort: Pulmonary effort is normal. No tachypnea or respiratory distress.     Breath sounds: Examination of the right-lower field reveals rales. Examination of the left-lower field reveals rales. Rales present. No wheezing.  Abdominal:     General: Bowel sounds are normal. There is no distension.     Palpations: Abdomen is soft.     Tenderness: There is no abdominal tenderness.  There is no guarding or rebound.  Musculoskeletal:        General: No tenderness. Normal range of motion.     Cervical back: Normal range of motion.     Right lower leg: No edema.     Left lower leg: No edema.  Lymphadenopathy:     Cervical: No cervical adenopathy.  Skin:    General: Skin is warm and dry.  Neurological:     Mental Status: She is alert and oriented to person, place, and time.     Labs on Admission: I have personally reviewed following labs and imaging studies  CBC: Recent Labs  Lab 11/26/19 0503 11/27/19 0500 11/28/19 0417  WBC 15.7* 12.6* 9.4  HGB 9.6* 9.0* 9.8*  HCT 31.1* 29.3* 30.5*  MCV 85.9 84.9 80.9  PLT 260 250 [id number]    Basic Metabolic Panel: Recent Labs  Lab 11/26/19 0503 11/27/19 0500 11/27/19 0553 11/28/19 0417  NA 137 136  --  135  K 3.5 2.5*  --  3.2*  CL 107 108  --  103  CO2 23 20*  --  23  GLUCOSE 93 80  --  93  BUN 23 12  --  11  CREATININE 0.68 0.56  --  0.59  CALCIUM 7.4* 7.3*  --  7.9*  MG  --   --  1.6* 2.0  PHOS  --   --  1.9*  --     GFR: Estimated Creatinine Clearance: 56.6 mL/min (by C-G formula based on SCr of 0.59 mg/dL).  Liver Function Tests: Recent Labs  Lab 11/24/19 2133  AST 21  ALT 16  ALKPHOS 88  BILITOT 0.4  PROT 7.2  ALBUMIN 3.7   Recent Labs  Lab 11/24/19 2133  LIPASE 33    Urine analysis:    Component Value Date/Time   COLORURINE YELLOW 11/24/2019 2133   APPEARANCEUR CLEAR 11/24/2019 2133   LABSPEC >1.046 (H) 11/24/2019 2133   PHURINE 5.0 11/24/2019 2133   GLUCOSEU NEGATIVE 11/24/2019 2133   HGBUR NEGATIVE 11/24/2019 2133   BILIRUBINUR NEGATIVE 11/24/2019 2133   [place] NEGATIVE 11/24/2019 2133   PROTEINUR NEGATIVE 11/24/2019 2133   NITRITE NEGATIVE 11/24/2019 2133   LEUKOCYTESUR TRACE (A) 11/24/2019 2133     Radiological Exams on Admission: DG CHEST PORT 1 VIEW  Result Date: 11/27/2019 CLINICAL DATA:  Increasing shortness of breath. EXAM: PORTABLE CHEST 1 VIEW COMPARISON:   11/24/2019. FINDINGS: Trachea is midline. Heart size stable. Calcified left hilar lymph nodes and calcified granulomas. There is developing right infrahilar airspace opacification. Streaky densities in the lingula and left lower lobe appear new. No definite pleural fluid. Postoperative changes in the proximal right humerus. IMPRESSION: 1. New right infrahilar airspace opacification may be due to pneumonia. 2. Streaky opacities in the lingula and left lower lobe are likely due to atelectasis. Electronically Signed   By: Lorin Picket M.D.   On: 11/27/2019 09:51  Assessment/Plan Active Problems:   Diverticulitis of colon with perforation   Perforation of sigmoid colon due to diverticulitis   Diverticulitis    Right lower lobe pneumonia Patient symptomatic with produtive cough and infiltrate noted on chest x-ray. Afebrile. Patient has already on Zosyn for diverticulitis which has been transitioned to Augmentin today. She is improving clinically. -Continue Augmentin, flutter valve, incentive spirometer. Recommending treating for a total of 7 days (4 more days including today) -Will hold off on macrolide treatment since she is improving  Acute respiratory failure with hypoxia Patient experienced hypoxia with an SpO2 as low as 88% on room air. She is currently on 2 L of oxygen via [place]. Likely related to pneumonia vs atelectasis. Lasix from last night possibly helped as well as patient is net positive 8 L from I/O. Urine output is not accurately measured, however. IV fluids discontinued. -Wean to room air -Ambulatory pulse ox today if able to wean to room air -Goal SpO2 >92% -Incentive spirometer  Essential hypertension Patient is on losartan 50 mg daily as an outpatient. BP while inpatient has been between [id number] systolic. Currently on home losartan and has received a dose of prn hydralazine IV as well for poorly controlled hypertension. Kidney function appears stable. At home, she states her  systolic pressure is generally in the 130s -Increase to losartan 100 mg daily; recommend for PCP to recheck BMP in about 2 weeks after dose change -If continues to not be optimized prior to discharge, recommend close outpatient PCP follow-up -Discussed with patient to continue to monitor BP at home and if BP is below baseline of [id number] systolic, can resume previous home dose of losartan 50 mg daily  Acute anemia Unknown etiology. Baseline hemoglobin of 11-12. Down to 9.8 and stable.  -Iron, TIBC, Ferritin in AM  Hypokalemia Mild. Magnesium on 2/17 of 1.6. Patient received a dose of Lasix 40 mg on 2/17 which likely contributed as well. -Continue potassium; BMP daily since potassium is currently scheduled -Magnesium IV; repeat magnesium in AM  Diverticulitis Perforation of sigmoid colon Medically managed by general surgery with IV Zosyn. Clinically improved.  Constipation Per primary. Stool regimen adjusted today  Hyperlipidemia On Crestor as an outpatient. Can continue inpatient or resume on discharge  Insomnia -Continue Trazodone prn   DVT prophylaxis: Per primary; Lovenox Code Status: Full code Family Communication: Daughter at bedside Disposition Plan: Per primary   Cordelia Poche, MD [facility] 11/28/2019, 9:24 AM

## 2019-11-28 NOTE — Progress Notes (Signed)
[facility] Progress Note     Subjective: CC-  Continues to feel better each day. Denies abdominal pain. Tolerating full liquids. Passing flatus but has not had a BM. Denies n/v. WBC normalized 9.4, afebrile. SOB improving. On 2L [place]. Coughing up some phlegm.   Objective: Vital signs in last 24 hours: Temp:  [97.9 F (36.6 C)-98.4 F (36.9 C)] 98.2 F (36.8 C) (02/18 0523) Pulse Rate:  [80-98] 82 (02/18 0523) Resp:  [16-24] 16 (02/18 0523) BP: (159-186)/(78-87) 179/81 (02/18 0523) SpO2:  [94 %-100 %] 100 % (02/18 0523) Last BM Date: 11/23/19  Intake/Output from previous day: 02/17 0701 - 02/18 0700 In: 4566.4 [P.O.:360; I.V.:3955.9; IV Piggyback:250.5] Out: 500 [Urine:500] Intake/Output this shift: No intake/output data recorded.  PE: Gen: Alert, NAD, pleasant HEENT: EOM's intact, pupils equal and round Card: RRR Pulm: CTAB, no W/R/R, rate and effort normal on 2L [place] Abd: Soft,ND, nontender, +BS, no HSM Skin: no rashes noted, warm and dry   Lab Results:  Recent Labs    11/27/19 0500 11/28/19 0417  WBC 12.6* 9.4  HGB 9.0* 9.8*  HCT 29.3* 30.5*  PLT 250 297   BMET Recent Labs    11/27/19 0500 11/28/19 0417  NA 136 135  K 2.5* 3.2*  CL 108 103  CO2 20* 23  GLUCOSE 80 93  BUN 12 11  CREATININE 0.56 0.59  CALCIUM 7.3* 7.9*   PT/INR No results for input(s): LABPROT, INR in the last 72 hours. CMP     Component Value Date/Time   NA 135 11/28/2019 0417   NA 137 09/25/2019 1617   K 3.2 (L) 11/28/2019 0417   CL 103 11/28/2019 0417   CO2 23 11/28/2019 0417   GLUCOSE 93 11/28/2019 0417   BUN 11 11/28/2019 0417   BUN 17 09/25/2019 1617   CREATININE 0.59 11/28/2019 0417   CALCIUM 7.9 (L) 11/28/2019 0417   PROT 7.2 11/24/2019 2133   PROT 7.3 09/25/2019 1617   ALBUMIN 3.7 11/24/2019 2133   ALBUMIN 4.3 09/25/2019 1617   AST 21 11/24/2019 2133   ALT 16 11/24/2019 2133   ALKPHOS 88 11/24/2019 2133   BILITOT 0.4 11/24/2019 2133   BILITOT  0.3 09/25/2019 1617   GFRNONAA >60 11/28/2019 0417   GFRAA >60 11/28/2019 0417   Lipase     Component Value Date/Time   LIPASE 33 11/24/2019 2133       Studies/Results: DG CHEST PORT 1 VIEW  Result Date: 11/27/2019 CLINICAL DATA:  Increasing shortness of breath. EXAM: PORTABLE CHEST 1 VIEW COMPARISON:  11/24/2019. FINDINGS: Trachea is midline. Heart size stable. Calcified left hilar lymph nodes and calcified granulomas. There is developing right infrahilar airspace opacification. Streaky densities in the lingula and left lower lobe appear new. No definite pleural fluid. Postoperative changes in the proximal right humerus. IMPRESSION: 1. New right infrahilar airspace opacification may be due to pneumonia. 2. Streaky opacities in the lingula and left lower lobe are likely due to atelectasis. Electronically Signed   By: Lorin Picket M.D.   On: 11/27/2019 09:51    Anti-infectives: Anti-infectives (From admission, onward)   Start     Dose/Rate Route Frequency Ordered Stop   11/25/19 1400  piperacillin-tazobactam (ZOSYN) IVPB 3.375 g     3.375 g 12.5 mL/hr over 240 Minutes Intravenous Every 8 hours 11/25/19 0241     11/25/19 0130  piperacillin-tazobactam (ZOSYN) IVPB 3.375 g     3.375 g 100 mL/hr over 30 Minutes Intravenous  Once 11/25/19 0126 11/25/19  0313       Assessment/Plan HTN - home meds HLD H/o breast cancer Hypokalemia - replace Hypomagnesemia - resolved SOB - CXR with atelectasis and possible PNA. Given lasix 2/17. SOB improving. Will ask TRH to consult  Perforated diverticulitis -CT scan showedperforated diverticulitis involving the distal descending colon,scattered pockets of free air throughout the abdomen,moderate amount of free fluid in the pelvis without evidence for well-formed absces - no prior colonoscopy  ID -zosyn 2/15>>2/18, augmentin 2/18>> FEN - soft diet VTE -SCDs, lovenox Foley -none Follow up -TBD  Plan- Advance to soft diet and  transition to oral antibiotics (augmentin). Give 1 dose of miralax. Continue mobilizing.  Will ask TRH to see regarding blood pressure management and SOB/PNA.   LOS: 3 days    [facility] 11/28/2019, 9:18 AM Please see Amion for pager number during day hours 7:00am-4:30pm

## 2019-11-28 NOTE — Progress Notes (Signed)
The patient is receiving Protonix by the intravenous route.  Based on criteria approved by the Pharmacy and [place], the medication is being converted to the equivalent oral dose form.  These criteria include: -No active GI bleeding -Able to tolerate diet of full liquids (or better) or tube feeding -Able to tolerate other medications by the oral or enteral route  If you have any questions about this conversion, please contact the Pharmacy Department (phone [phone]).  Thank you.   Dolly Rias RPh 11/28/2019, 9:31 AM

## 2019-11-29 DIAGNOSIS — K5720 Diverticulitis of large intestine with perforation and abscess without bleeding: Principal | ICD-10-CM

## 2019-11-29 LAB — BASIC METABOLIC PANEL
Anion gap: 8 (ref 5–15)
BUN: 14 mg/dL (ref 8–23)
CO2: 23 mmol/L (ref 22–32)
Calcium: 8.2 mg/dL — ABNORMAL LOW (ref 8.9–10.3)
Chloride: 105 mmol/L (ref 98–111)
Creatinine, Ser: 0.58 mg/dL (ref 0.44–1.00)
GFR calc Af Amer: >60 mL/min (ref >60)
GFR calc non Af Amer: >60 mL/min (ref >60)
Glucose, Bld: 98 mg/dL (ref 70–99)
Potassium: 4.4 mmol/L (ref 3.5–5.1)
Sodium: 136 mmol/L (ref 135–145)

## 2019-11-29 LAB — IRON AND TIBC
Iron: 14 ug/dL — ABNORMAL LOW (ref 28–170)
Saturation Ratios: 6 % — ABNORMAL LOW (ref 10.4–31.8)
TIBC: 238 ug/dL — ABNORMAL LOW (ref 250–450)
UIBC: 224 ug/dL

## 2019-11-29 LAB — FERRITIN: Ferritin: 285 ng/mL (ref 11–307)

## 2019-11-29 LAB — MAGNESIUM: Magnesium: 2 mg/dL (ref 1.7–2.4)

## 2019-11-29 MED ORDER — AMOXICILLIN-POT CLAVULANATE 875-125 MG PO TABS: 1.0000 | ORAL_TABLET | Freq: Two times a day (BID) | ORAL | 0 refills | Status: AC

## 2019-11-29 MED ORDER — POLYETHYLENE GLYCOL 3350 17 G PO PACK: 17.0000 g | PACK | Freq: Every day | ORAL | 0 refills | Status: DC | PRN

## 2019-11-29 MED ORDER — POLYETHYLENE GLYCOL 3350 17 G PO PACK: 17.0000 g | PACK | Freq: Every day | ORAL | Status: DC | PRN

## 2019-11-29 MED ORDER — LOSARTAN POTASSIUM 50 MG PO TABS: 50.0000 mg | ORAL_TABLET | Freq: Every day | ORAL | 1 refills | Status: DC

## 2019-11-29 MED FILL — LOSARTAN POTASSIUM 50 MG TA: 50 | 30 days supply | Qty: 30 | Fill #0

## 2019-11-29 MED FILL — AMOX-CLAV 875-125 MG TABLET: 875-125 | 6 days supply | Qty: 12 | Fill #0

## 2019-11-29 NOTE — Plan of Care (Signed)
Patient given discharge instructions, and verbalized an understanding of all discharge instructions.  Patient agrees with discharge plan, and is being discharged in stable medical condition.  Patient given transportation via wheelchair. 

## 2019-11-29 NOTE — Discharge Summary (Signed)
[facility] Discharge Summary   Patient ID: Maria Dominguez MRN: [id number] DOB/AGE: 70-Jul-1952 70 y.o.  Admit date: 11/24/2019 Discharge date: 11/29/2019  Admitting Diagnosis: Perforated diverticulitis  Discharge Diagnosis Patient Active Problem List   Diagnosis Date Noted  . Diverticulitis of colon with perforation 11/25/2019  . Perforation of sigmoid colon due to diverticulitis 11/25/2019  . Diverticulitis 11/25/2019  . essential hypertension 10/17/2017    Consultants Hospitalist   Imaging: No results found.  Procedures None  Hospital Course:  Maria Dominguez is a 69yo female PMH HTN who presented to [facility] 2/15 with acute onset lower abdominal pain.  Workup showed perforated diverticulitis with small pockets of free air.  Patient was admitted for IV antibiotics and bowel rest. Abdominal pain and leukocytosis resolved and diet was advanced as tolerated. Patient did develop some shortness of breath during admission and was found to have a right lower lobe pneumonia on chest xray. Hospitalist service was consulted for assistance with management of this as well as her elevated blood pressure. She was kept on oral antibiotics for pneumonia and home blood pressure medication dosage increased. On 2/19 the patient was tolerating diet, having bowel function, pain well controlled, vital signs stable and felt stable for discharge home.  She will go home with 6 days of augmentin and be referred to gastroenterology for a colonoscopy in 6-8 weeks. She will follow up with her primary care physician regarding hypertension and pneumonia.   Allergies as of 11/29/2019   No Known Allergies     Medication List    TAKE these medications   acetaminophen 500 MG tablet Commonly known as: TYLENOL Take 500 mg by mouth every 6 (six) hours as needed for mild pain.   amoxicillin-clavulanate 875-125 MG tablet Commonly known as: AUGMENTIN Take 1 tablet by mouth every 12 (twelve) hours for 6  days.   cholecalciferol 25 MCG (1000 UNIT) tablet Commonly known as: VITAMIN D3 Take 1,000 Units by mouth daily.   losartan 50 MG tablet Commonly known as: COZAAR Take 1 tablet (50 mg total) by mouth daily.   polyethylene glycol 17 g packet Commonly known as: MIRALAX / GLYCOLAX Take 17 g by mouth daily as needed for mild constipation.   rosuvastatin 10 MG tablet Commonly known as: Crestor Take 1 tablet (10 mg total) by mouth daily.   traZODone 50 MG tablet Commonly known as: DESYREL Take 0.5-1 tablets (25-50 mg total) by mouth at bedtime as needed for sleep.   vitamin B-12 1000 MCG tablet Commonly known as: CYANOCOBALAMIN Take 1,000 mcg by mouth daily.   Vitamin D (Ergocalciferol) 1.25 MG (50000 UNIT) Caps capsule Commonly known as: DRISDOL Take 1 capsule (50,000 Units total) by mouth every 7 (seven) days.   vitamin E 180 MG (400 UNITS) capsule Generic drug: vitamin E Take 400 Units by mouth daily.          Signed: Wellington Hampshire, [facility] 11/29/2019, 10:17 AM Please see Amion for pager number during day hours 7:00am-4:30pm

## 2019-11-29 NOTE — Discharge Instructions (Signed)
Plan de alimentacin con bajo contenido de fibra Low-Fiber Eating Plan La fibra se encuentra en las frutas, las verduras, los cereales integrales y las legumbres. Consumir una dieta con bajo contenido de [place] a reducir la frecuencia de las deposiciones y la cantidad de cada deposicin. Un plan de alimentacin con bajo contenido de fibra puede ayudar a que su aparato digestivo se cure en los siguientes casos:  Padece ciertas enfermedades, como la enfermedad de Crohn o diverticulitis.  Recibi radioterapia recientemente en la pelvis o los intestinos.  Acaba de someterse a una ciruga intestinal.  Tiene un nuevo orificio quirrgico en el abdomen (colostoma o ileostoma).  Tiene un estrechamiento de intestino (estenosis). El mdico determinar durante cunto tiempo deber seguir esta dieta. El mdico puede recomendarle que trabaje con un especialista en dietas y alimentacin (nutricionista). Consejos para seguir [place] [employer] plan [place] generales  Siga las indicaciones de su nutricionista sobre la cantidad de fibra diaria que necesita incorporar.  La mayora de las personas que siguen este plan de alimentacin deben tratar de consumir menos de 10gramos (g) de [employer]. Su meta de cantidad de fibra diaria es ________________g.  Tome los suplementos vitamnicos y WellPoint se lo hayan indicado el mdico o el nutricionista. Las presentaciones lquidas o masticables son las mejores para Catering manager de alimentacin. Leer las etiquetas de los alimentos  Consulte las etiquetas de los alimentos para saber la cantidad de fibra alimentaria que contienen.  Elija alimentos que tengan menos de 2gramos de fibra por porcin. Coccin  Use harina blanca y otros granos permitidos para hornear y Audiological scientist.  Cocine la carne con mtodos que la dejen [place], como en estofado o hervida.  Cocine los [employer] Corporation que la yema est bien slida.  Elijas las grasas saludables, como el aceite de oliva o el  aceite de canola. Planificacin de las comidas   Haga 5o6comidas pequeas durante el da, en lugar de 3comidas abundantes.  Si es intolerante a la lactosa: ? Elija alimentos lcteos con bajo contenido de lactosa. ? No consuma productos lcteos si as se lo indica su nutricionista.  Limite las grasas y los aceites a menos de Advertising copywriter.  Coma porciones pequeas de postres. Qu alimentos estn permitidos? Esta podra no ser [employer] Foods Company. Hable con el nutricionista sobre las mejores opciones alimentarias para usted. Carbohidratos The Mutual [employer] Omaha panes y las galletas saladas elaborados con harina blanca. Waffles, panqueques y tostadas francesas. Rosquillas. Pretzels. Tostadas Melba, biscote y Water quality scientist. Cereales cocidos y secos que no contengan granos enteros, agregado de [place], semillas o frutas disecadas. Harina de maz. [place]n. Cereales calientes y fros hechos con maz, trigo, arroz o avena refinados. Pasta sin relleno y fideos. Arroz blanco. Verduras Verduras bien cocidas o enlatadas sin semillas, piel ni tallos. Papas cocidas, sin cscara. Jugo de verduras. Lambert Mody Frutas cocidas o enlatadas, sin semillas ni cscara. Banana madura pelada. Pur de [employer]. Jugos de frutas sin pulpa. Carnes y otros alimentos ricos en protenas Carne molida. Cortes tiernos de carne o aves. Huevos. Pescado, frutos de mar y Ber[employer] H[employer]away. [place]illa suave de frutos secos. Tofu. [place] bebidas y los productos lcteos en su totalidad. Leches deslactosadas, incluidas las leches de [place], soja y [place]. Yogurt sin mezclas de frutas, frutos secos, chocolate o granola. Rite Aid. Time Warner. Queso. Bebidas Caf descafeinado. Jugos de frutas y verduras, o batidos (pequeas cantidades, sin pulpa ni piel, y con frutas de la lista permitida). Bebidas deportivas. T de hierbas. Grasas y Futures trader de  oliva, de canola, de girasol, de linasa y de semillas de [place]. Mayonesa. Queso crema.  Margarina. Hartley. Dulces y postres Bizcochuelos y Museum/gallery exhibitions officer. Pasteles con crema y hechos con las frutas permitidas. Pudin. Natillas. Gelatina de frutas. Sorbete. Helados de agua. Helados sin frutos secos. Caramelos duros comunes. Miel. Gelatina. Melaza. Jarabes, incluido el jarabe de chocolate. Chocolate. Malvaviscos. Pastillas de goma. Condimentos y otros alimentos Consom. Caldos. Sopas crema hechas con los alimentos permitidos. Sopas coladas. Cazuelas preparadas con alimentos permitidos. [place]. Mostaza suave. Aderezos suaves para ensaladas. Salsas naturales. Vinagre. Especias con moderacin. Sal. Azcar. Qu alimentos no estn permitidos? Esta podra no ser Dean Foods Company. Hable con el nutricionista sobre las mejores opciones alimentarias para usted. Carbohidratos Todo tipo de McDonald's Corporation y panes integrales y de grano entero. Panes y Marketing executive. Pan de centeno. Cereales integrales y multigrano. Cereales con frutos secos, pasas de uva o coco. Salvado. Cereales de grano grueso. Granola. Cereales con [place]. Pan de maz o de harina de maz. Pastas integrales. Arroz integral o arroz salvaje. Quinua. Palomitas de maz. Trigo sarraceno. Germen de trigo. Verduras Cscara de papas. Verduras crudas o semicocidas. Todas las legumbres y brotes de soja. Vegetales de Boeing cocidos. Maz. Guisantes. Repollo. Remolachas. Brcoli. Repollitos de Bruselas. Coliflor. Hongos. Cebollas. Pimienta. Chiriva. Calal. Chucrut. Lambert Mody Frutas crudas o disecadas. Frutos rojos. Jugo de frutas con pulpa. Jugo de ciruelas. Carnes y otros alimentos ricos en protenas Carnes duras y fibrosas con Database administrator. Carnes grasas. Carne de ave con piel. Carne de vaca, aves o pescado. Embutidos y fiambres. Salchichas, tocino y perritos calientes. Frutos secos y [place] con trozos de frutos secos. Arvejas secas, guisantes y lentejas. Lcteos Yogurt con mezclas de frutas, frutos secos,  chocolate o granola. Bebidas T y caf no descafeinados. Grasas y Medical laboratory scientific officer. Coco. Araceli Bouche y postres Postres, galletas dulces o golosinas que contengan frutos secos o [place]. Frutas desecadas. Jaleas y conservas sin semillas. Mermeladas. Cualquier postre hecho con frutas o granos que no estn permitidos. Condimentos y otros alimentos Nachos. Sopas hechas con verduras o granos no permitidos. Salsa de pepinillos. Rbano picante. Pepinillos. Aceitunas. Resumen  La State Farm de las personas que siguen un plan de alimentacin de bajo contenido de fibra deben tratar de consumir menos de 10gramos de Conservator, museum/gallery. Siga las indicaciones de su nutricionista sobre la cantidad de fibra diaria que necesita incorporar.  Siempre controle las etiquetas de los alimentos para saber el contenido de fibra alimentaria de los alimentos envasados. En general, un alimento con bajo contenido de [place] tiene menos de 2gramos de fibra por porcin.  Como regla general, trate de evitar los granos enteros, las frutas y verduras crudas, las frutas disecadas, los cortes duros de carnes, los frutos secos y las semillas.  Tome un suplemento de vitaminas y WellPoint se lo haya indicado el mdico o el nutricionista. Esta informacin no tiene Marine scientist el consejo del mdico. Asegrese de hacerle al mdico cualquier pregunta que tenga. Document Revised: 05/05/2017 Document Reviewed: 05/05/2017 Elsevier Patient Education  [place].    Diverticulitis Diverticulitis  La diverticulitis ocurre cuando pequeos bolsillos que se han formado en el intestino grueso (colon) se infectan o se inflaman. Esto produce dolor de Paramedic y heces lquidas (diarrea). Estas bolsas en el colon se denominan divertculos. Se forman en las personas que tienen una afeccin llamada diverticulitis. Siga estas indicaciones en su casa: Medicamentos  Delphi de venta libre y los recetados solamente como se lo  haya indicado el mdico. Estos incluyen los siguientes: ? Antibiticos. ? Analgsicos. ? Pastillas de [place]. ? Probiticos. ? Laxantes.  No conduzca ni use maquinaria pesada mientras toma analgsicos recetados.  Si le recetaron un antibitico, tmelo como se lo hayan indicado. No deje de tomarlos aunque se sienta mejor. Instrucciones generales   Siga la dieta como se lo haya indicado el mdico.  Cuando se sienta mejor, el mdico puede indicarle que cambie la dieta. Tal vez necesite ingerir gran cantidad de fibra. La fibra facilita la evacuacin intestinal (defecacin). Entre los alimentos saludables con [place], se incluyen los siguientes: ? Frutos rojos. ? Frijoles. ? Lentejas. ? Verduras de Boeing.  Haga ejercicios 3 o ms veces por semana. Hgalos durante 30 minutos cada vez. Ejerctese lo suficiente como para transpirar y Conservation officer, historic buildings los latidos cardacos.  Concurra a todas las visitas de control como se lo hayan indicado. Esto es importante. Puede que tenga que someterse a un examen del intestino grueso. Esto se denomina colonoscopia. Comunquese con un mdico si:  El dolor no mejora.  Le cuesta mucho comer o beber.  No defeca como lo hace normalmente. Solicite ayuda de inmediato si:  El [place] representative.  Los problemas no mejoran.  Los problemas empeoran muy rpidamente.  Tiene fiebre.  Devuelve (vomita) ms de una vez.  Sus heces tienen las siguientes caractersticas: ? Teacher, English as a foreign language. ? Son de color negro. ? Son alquitranadas. Resumen  La diverticulitis ocurre cuando pequeos bolsillos que se han formado en el intestino grueso (colon) se infectan o se inflaman.  Tome los medicamentos solamente como se lo haya indicado el mdico.  Siga la dieta como se lo haya indicado el mdico. Esta informacin no tiene Marine scientist el consejo del mdico. Asegrese de hacerle al mdico cualquier pregunta que tenga. Document Revised: 03/30/2017 Document Reviewed:  03/30/2017 Elsevier Patient Education  [place].

## 2019-11-29 NOTE — Progress Notes (Signed)
PROGRESS NOTE    Maria Dominguez  [id number] DOB: 21-Oct-1950 DOA: 11/24/2019 PCP: [name], NP    Brief Narrative:  Admitted with complicated diverticulitis treated conservatively.  Consult for shortness of breath and admitted blood pressures.   Assessment & Plan:   Active Problems:   Diverticulitis of colon with perforation   Perforation of sigmoid colon due to diverticulitis   Diverticulitis  Complicated diverticulitis: Clinically improved.  As per surgical plan going home on oral Augmentin and outpatient follow-up with colonoscopy.  Shortness of breath: Probably due to atelectasis versus pneumonia.  Treated with chest physiotherapy, continued antibiotics with clinical improvement.  Same antibiotics will be enough coverage.  Hypertension: Accelerated in the hospital.  Anticipate normalizing at home.  Losartan increased from 50 to 100 mg and prescription sent to her pharmacy.  Patient is medically stable to discharge from her oxygenation and blood pressure issues.   DVT prophylaxis: Lovenox Code Status: Full code Family Communication: Daughter at bedside.  Translator on the video. Disposition Plan: patient is from home. Anticipated DC to home, Barriers to discharge probable home today by surgery.    Subjective: Patient seen and examined.  Daughter at the bedside.  Used video interpreter. Patient denied any complaints.  Pain is very minimum on her belly and manageable.  She says she walks around and did not get short of breath.  Tolerating liquid diet.  On room air.  Objective: Vitals:   11/28/19 0523 11/28/19 1400 11/28/19 2031 11/29/19 0504  BP: (!) 179/81 (!) 160/82 (!) 176/89 136/76  Pulse: 82 81 (!) 102 73  Resp: 16 20 18 16   Temp: 98.2 F (36.8 C) 99.1 F (37.3 C) 99 F (37.2 C) 98.9 F (37.2 C)  TempSrc:  Oral Oral Oral  SpO2: 100% 99% 99% 96%  Weight:      Height:        Intake/Output Summary (Last 24 hours) at 11/29/2019 0938 Last data filed  at 11/28/2019 1450 Gross per 24 hour  Intake 240 ml  Output 200 ml  Net 40 ml   Filed Weights   11/25/19 0400  Weight: 70.3 kg    Examination:  General exam: Appears calm and comfortable  Respiratory system: Clear to auscultation.  Cardiovascular system: S1 & S2 heard, RRR.  Gastrointestinal system: Abdomen is nondistended, soft and nontender. No organomegaly or masses felt. Normal bowel sounds heard. Central nervous system: Alert and oriented. No focal neurological deficits. Extremities: Symmetric 5 x 5 power. Skin: No rashes, lesions or ulcers Psychiatry: Judgement and insight appear normal.     Data Reviewed: I have personally reviewed following labs and imaging studies  CBC: Recent Labs  Lab 11/24/19 2133 11/25/19 0413 11/26/19 0503 11/27/19 0500 11/28/19 0417  WBC 12.9* 16.8* 15.7* 12.6* 9.4  HGB 11.9* 11.4* 9.6* 9.0* 9.8*  HCT 38.0 36.7 31.1* 29.3* 30.5*  MCV 83.9 84.2 85.9 84.9 80.9  PLT 334 306 260 250 [id number]   Basic Metabolic Panel: Recent Labs  Lab 11/25/19 0413 11/26/19 0503 11/27/19 0500 11/27/19 0553 11/28/19 0417 11/29/19 0433  NA 135 137 136  --  135 136  K 3.6 3.5 2.5*  --  3.2* 4.4  CL 105 107 108  --  103 105  CO2 21* 23 20*  --  23 23  GLUCOSE 141* 93 80  --  93 98  BUN 19 23 12   --  11 14  CREATININE 0.63 0.68 0.56  --  0.59 0.58  CALCIUM 8.0* 7.4* 7.3*  --  7.9* 8.2*  MG  --   --   --  1.6* 2.0 2.0  PHOS  --   --   --  1.9*  --   --    GFR: Estimated Creatinine Clearance: 56.6 mL/min (by C-G formula based on SCr of 0.58 mg/dL). Liver Function Tests: Recent Labs  Lab 11/24/19 2133  AST 21  ALT 16  ALKPHOS 88  BILITOT 0.4  PROT 7.2  ALBUMIN 3.7   Recent Labs  Lab 11/24/19 2133  LIPASE 33   No results for input(s): AMMONIA in the last 168 hours. Coagulation Profile: No results for input(s): INR, PROTIME in the last 168 hours. Cardiac Enzymes: No results for input(s): CKTOTAL, CKMB, CKMBINDEX, TROPONINI in the last 168  hours. BNP (last 3 results) No results for input(s): PROBNP in the last 8760 hours. HbA1C: No results for input(s): HGBA1C in the last 72 hours. CBG: No results for input(s): GLUCAP in the last 168 hours. Lipid Profile: No results for input(s): CHOL, HDL, LDLCALC, TRIG, CHOLHDL, LDLDIRECT in the last 72 hours. Thyroid Function Tests: No results for input(s): TSH, T4TOTAL, FREET4, T3FREE, THYROIDAB in the last 72 hours. Anemia Panel: Recent Labs    11/29/19 0433  FERRITIN 285  TIBC 238*  IRON 14*   Sepsis Labs: Recent Labs  Lab 11/24/19 2213 11/25/19 0728 11/26/19 0503  LATICACIDVEN 2.8* 2.2* 1.6    Recent Results (from the past 240 hour(s))  Respiratory Panel by RT PCR (Flu A&B, Covid) - Nasopharyngeal Swab     Status: None   Collection Time: 11/25/19  1:35 AM   Specimen: Nasopharyngeal Swab  Result Value Ref Range Status   SARS Coronavirus 2 by RT PCR NEGATIVE NEGATIVE Final    Comment: (NOTE) SARS-CoV-2 target nucleic acids are NOT DETECTED. The SARS-CoV-2 RNA is generally detectable in upper respiratoy specimens during the acute phase of infection. The lowest concentration of SARS-CoV-2 viral copies this assay can detect is 131 copies/mL. A negative result does not preclude SARS-Cov-2 infection and should not be used as the sole basis for treatment or other patient management decisions. A negative result may occur with  improper specimen collection/handling, submission of specimen other than nasopharyngeal swab, presence of viral mutation(s) within the areas targeted by this assay, and inadequate number of viral copies (<131 copies/mL). A negative result must be combined with clinical observations, patient history, and epidemiological information. The expected result is Negative. Fact Sheet for Patients:  PinkCheek.be Fact Sheet for Healthcare Providers:  GravelBags.it This test is not yet ap proved or  cleared by the [place] FDA and  has been authorized for detection and/or diagnosis of SARS-CoV-2 by FDA under an Emergency Use Authorization (EUA). This EUA will remain  in effect (meaning this test can be used) for the duration of the COVID-19 declaration under Section 564(b)(1) of the Act, 21 U.S.C. section 360bbb-3(b)(1), unless the authorization is terminated or revoked sooner.    Influenza A by PCR NEGATIVE NEGATIVE Final   Influenza B by PCR NEGATIVE NEGATIVE Final    Comment: (NOTE) The Xpert Xpress SARS-CoV-2/FLU/RSV assay is intended as an aid in  the diagnosis of influenza from Nasopharyngeal swab specimens and  should not be used as a sole basis for treatment. Nasal washings and  aspirates are unacceptable for Xpert Xpress SARS-CoV-2/FLU/RSV  testing. Fact Sheet for Patients: PinkCheek.be Fact Sheet for Healthcare Providers: GravelBags.it This test is not yet approved or cleared by the [place] and  has been authorized for  detection and/or diagnosis of SARS-CoV-2 by  FDA under an Emergency Use Authorization (EUA). This EUA will remain  in effect (meaning this test can be used) for the duration of the  Covid-19 declaration under Section 564(b)(1) of the Act, 21  U.S.C. section 360bbb-3(b)(1), unless the authorization is  terminated or revoked. Performed at [facility], [place] [address], [place], [place] [address]          Radiology Studies: DG CHEST PORT 1 VIEW  Result Date: 11/27/2019 CLINICAL DATA:  Increasing shortness of breath. EXAM: PORTABLE CHEST 1 VIEW COMPARISON:  11/24/2019. FINDINGS: Trachea is midline. Heart size stable. Calcified left hilar lymph nodes and calcified granulomas. There is developing right infrahilar airspace opacification. Streaky densities in the lingula and left lower lobe appear new. No definite pleural fluid. Postoperative changes in the proximal  right humerus. IMPRESSION: 1. New right infrahilar airspace opacification may be due to pneumonia. 2. Streaky opacities in the lingula and left lower lobe are likely due to atelectasis. Electronically Signed   By: Lorin Picket M.D.   On: 11/27/2019 09:51        Scheduled Meds: . amoxicillin-clavulanate  1 tablet Oral Q12H  . docusate sodium  100 mg Oral BID  . enoxaparin (LOVENOX) injection  40 mg Subcutaneous Q24H  . losartan  100 mg Oral Daily  . pantoprazole  40 mg Oral QHS   Continuous Infusions:   LOS: 4 days    Time spent: 25 minutes    Barb Merino, MD [facility] Pager [phone]

## 2019-11-29 NOTE — Progress Notes (Signed)
[facility] Progress Note     Subjective: CC-  Patient reports having some mild left sided crampy abdominal pain last night, feeling better this morning. Tolerating soft diet. She has had 2 BMs. Denies n/v. Tolerating augmentin. Breathing is better. Denies SOB.   Objective: Vital signs in last 24 hours: Temp:  [98.9 F (37.2 C)-99.1 F (37.3 C)] 98.9 F (37.2 C) (02/19 0504) Pulse Rate:  [73-102] 73 (02/19 0504) Resp:  [16-20] 16 (02/19 0504) BP: (136-176)/(76-89) 136/76 (02/19 0504) SpO2:  [96 %-99 %] 96 % (02/19 0504) Last BM Date: 11/28/19  Intake/Output from previous day: 02/18 0701 - 02/19 0700 In: 240 [P.O.:240] Out: 400 [Urine:400] Intake/Output this shift: No intake/output data recorded.  PE: Gen: Alert, NAD, pleasant HEENT: EOM's intact, pupils equal and round Card: RRR Pulm: CTAB, no W/R/R,rate and effort normal on 2L  Abd: Soft,ND,nontender, +BS, no HSM Skin: no rashes noted, warm and dry   Lab Results:  Recent Labs    11/27/19 0500 11/28/19 0417  WBC 12.6* 9.4  HGB 9.0* 9.8*  HCT 29.3* 30.5*  PLT 250 297   BMET Recent Labs    11/28/19 0417 11/29/19 0433  NA 135 136  K 3.2* 4.4  CL 103 105  CO2 23 23  GLUCOSE 93 98  BUN 11 14  CREATININE 0.59 0.58  CALCIUM 7.9* 8.2*   PT/INR No results for input(s): LABPROT, INR in the last 72 hours. CMP     Component Value Date/Time   NA 136 11/29/2019 0433   NA 137 09/25/2019 1617   K 4.4 11/29/2019 0433   CL 105 11/29/2019 0433   CO2 23 11/29/2019 0433   GLUCOSE 98 11/29/2019 0433   BUN 14 11/29/2019 0433   BUN 17 09/25/2019 1617   CREATININE 0.58 11/29/2019 0433   CALCIUM 8.2 (L) 11/29/2019 0433   PROT 7.2 11/24/2019 2133   PROT 7.3 09/25/2019 1617   ALBUMIN 3.7 11/24/2019 2133   ALBUMIN 4.3 09/25/2019 1617   AST 21 11/24/2019 2133   ALT 16 11/24/2019 2133   ALKPHOS 88 11/24/2019 2133   BILITOT 0.4 11/24/2019 2133   BILITOT 0.3 09/25/2019 1617   GFRNONAA >60  11/29/2019 0433   GFRAA >60 11/29/2019 0433   Lipase     Component Value Date/Time   LIPASE 33 11/24/2019 2133       Studies/Results: DG CHEST PORT 1 VIEW  Result Date: 11/27/2019 CLINICAL DATA:  Increasing shortness of breath. EXAM: PORTABLE CHEST 1 VIEW COMPARISON:  11/24/2019. FINDINGS: Trachea is midline. Heart size stable. Calcified left hilar lymph nodes and calcified granulomas. There is developing right infrahilar airspace opacification. Streaky densities in the lingula and left lower lobe appear new. No definite pleural fluid. Postoperative changes in the proximal right humerus. IMPRESSION: 1. New right infrahilar airspace opacification may be due to pneumonia. 2. Streaky opacities in the lingula and left lower lobe are likely due to atelectasis. Electronically Signed   By: Lorin Picket M.D.   On: 11/27/2019 09:51    Anti-infectives: Anti-infectives (From admission, onward)   Start     Dose/Rate Route Frequency Ordered Stop   11/28/19 1000  amoxicillin-clavulanate (AUGMENTIN) 875-125 MG per tablet 1 tablet     1 tablet Oral Every 12 hours 11/28/19 0924     11/25/19 1400  piperacillin-tazobactam (ZOSYN) IVPB 3.375 g  Status:  Discontinued     3.375 g 12.5 mL/hr over 240 Minutes Intravenous Every 8 hours 11/25/19 0241 11/28/19 0924   11/25/19 0130  piperacillin-tazobactam (ZOSYN) IVPB 3.375 g     3.375 g 100 mL/hr over 30 Minutes Intravenous  Once 11/25/19 0126 11/25/19 0313       Assessment/Plan HTN - per TRH, increased home med to losartan to 100mg  daily, f/u PCP [facility] H/o breast cancer Hypokalemia - replace Hypomagnesemia - resolved RLL PNA - appreciate TRH consult. Continue augmentin for a total of 7 days  Perforated diverticulitis -CT scan showedperforated diverticulitis involving the distal descending colon,scattered pockets of free air throughout the abdomen,moderate amount of free fluid in the pelvis without evidence for well-formed absces - no prior  colonoscopy  ID -zosyn 2/15>>2/18, augmentin 2/18>> FEN - soft diet VTE -SCDs, lovenox Foley -none Follow up -TBD  Plan-Patient stable for discharge from surgical standpoint. Will send her home with 6 days of augmentin to complete 10 day course, and refer to GI for colonoscopy in 6-8 weeks.  Will await final recs from [facility] regarding BP management, then will plan for discharge home.   LOS: 4 days    [facility] 11/29/2019, 8:57 AM Please see Amion for pager number during day hours 7:00am-4:30pm

## 2019-11-29 NOTE — Progress Notes (Signed)
Patient walked in the halls and maintained oxygen saturations above 98 percent on room air.

## 2019-12-02 ENCOUNTER — Encounter: Payer: Self-pay | Admitting: *Deleted

## 2019-12-03 ENCOUNTER — Encounter: Payer: Self-pay | Admitting: Internal Medicine

## 2019-12-03 ENCOUNTER — Ambulatory Visit (INDEPENDENT_AMBULATORY_CARE_PROVIDER_SITE_OTHER): Payer: Self-pay | Admitting: Internal Medicine

## 2019-12-03 VITALS — BP 172/82 | HR 96 | Temp 97.9°F | Ht <= 58 in | Wt 146.4 lb

## 2019-12-03 DIAGNOSIS — Z01818 Encounter for other preprocedural examination: Secondary | ICD-10-CM

## 2019-12-03 DIAGNOSIS — K5720 Diverticulitis of large intestine with perforation and abscess without bleeding: Secondary | ICD-10-CM

## 2019-12-03 MED ORDER — SUPREP BOWEL PREP KIT 17.5-3.13-1.6 GM/177ML PO SOLN: 1.0000 | ORAL | 0 refills | Status: DC

## 2019-12-03 NOTE — Patient Instructions (Addendum)
You have been scheduled for a colonoscopy. Please follow written instructions given to you at your visit today.  Please pick up your prep supplies at the pharmacy within the next 1-3 days. If you use inhalers (even only as needed), please bring them with you on the day of your procedure. Your physician has requested that you go to www.startemmi.com and enter the access code given to you at your visit today. This web site gives a general overview about your procedure. However, you should still follow specific instructions given to you by our office regarding your preparation for the procedure. ____________________________________________________________ Please follow a low residue diet for 1 week. _____________________________________________________________ Call our office if your pain returns after you complete your antibiotics. _____________________________________________________________ If you are age 18 or older, your body mass index should be between 23-30. Your Body mass index is 31.68 kg/m. If this is out of the aforementioned range listed, please consider follow up with your Primary Care Provider.  If you are age 70 or younger, your body mass index should be between 19-25. Your Body mass index is 31.68 kg/m. If this is out of the aformentioned range listed, please consider follow up with your Primary Care Provider.  ______________________________________________________________ Due to recent changes in healthcare laws, you may see the results of your imaging and laboratory studies on MyChart before your provider has had a chance to review them.  We understand that in some cases there may be results that are confusing or concerning to you. Not all laboratory results come back in the same time frame and the provider may be waiting for multiple results in order to interpret others.  Please give [place] 48 hours in order for your provider to thoroughly review all the results before contacting the  office for clarification of your results.  -------------------------------------------------------------------------------------------------  Se le ha programado una colonoscopia. Siga las instrucciones escritas que se le dieron en su visita de hoy. Recoja sus suministros de preparacin en la farmacia dentro de los prximos 1-3 das. Si [place] inhaladores (aunque solo sea necesario), trigalos el da de su procedimiento. Su mdico le ha solicitado que vaya a www.startemmi.com e ingrese el cdigo de [employer] dieron en su visita hoy. Este sitio web ofrece una descripcin general sobre su procedimiento. Sin embargo, debe seguir las instrucciones especficas que le proporcione nuestra oficina con respecto a su preparacin para el procedimiento. ____________________________________________________________ Trudi Ida dieta baja en residuos durante 1 semana. _____________________________________________________________ Shana Chute a nuestra oficina si su dolor regresa despus de completar sus antibiticos. _____________________________________________________________ Si tiene 65 aos o ms, su ndice de masa corporal debe estar entre 23-30. Su ndice de masa corporal es de 31,68 kg / m. Si esto est fuera del rango mencionado anteriormente, considere Optometrist un seguimiento con su Proveedor de Midwife.  Si tiene 42 aos o menos, su ndice de [employer] corporal debe estar entre 19-25. Su ndice de masa corporal es de 31,68 kg / m. Si esto est fuera del rango mencionado anteriormente, considere hacer un seguimiento con su Proveedor de Midwife. ______________________________________________________________ Debido a los cambios recientes en las leyes de atencin mdica, es posible que vea los resultados de sus estudios de diagnstico por imgenes y de laboratorio en MyChart antes de que su proveedor haya tenido la oportunidad de revisarlos. Entendemos que en algunos casos puede haber resultados  confusos o preocupantes para usted. No todos los resultados de laboratorio se obtienen en el mismo perodo de [place] y es posible que el proveedor est  esperando varios resultados para interpretar otros. Por favor, denos 45 horas para que su proveedor revise a fondo [employer] antes de comunicarse con la oficina para Audiological scientist. ___________________________________________________________

## 2019-12-03 NOTE — Progress Notes (Signed)
Patient ID: Maria Dominguez, female   DOB: [birth year], 69 y.o.   MRN: [id number] HPI: Maria Dominguez is a 69 year old female with PMH of recent hospitalization for complicated diverticulitis with perforation, hypertension, history of ulcer disease, history of breast cancer who is seen to discuss her diverticulitis and consider colonoscopy.  She is here today with her daughter and a medical Spanish interpreter.  She reports that approximately 10 days ago around 11/24/2019 she developed acute onset lower abdominal pain initially more right lower though moving to the left lower quadrant.  This became more severe and she presented to the emergency department.  There a CT scan showed diverticulitis complicated by perforation of the distal descending colon.  There were scattered pockets of free air throughout the abdomen.  There was no evidence of well-formed abscess and the appendix appeared normal.  She was admitted to the general surgery service where she received initially broad-spectrum antibiotics and bowel rest.  She was treated conservatively with medical management and discharged.  She has been on Augmentin twice daily and has for 5 days of this therapy left.  She reports that her pain has significantly improved.  She was also prescribed MiraLAX.  She is now having 1-2 soft but formed stools daily.  No diarrhea.  No blood in her stool or melena.  Abdominal pain has improved considerably.  She is trying to eat a low residue diet.  No upper GI or hepatobiliary complaint.  She has never had prior colonoscopy.  On further questioning it sounds like many years ago she had similar type pain in the right lower quadrant perhaps representing diverticulitis.  She does have a history of peptic ulcer disease with hemorrhage.  This was 7 years ago occurring in [place].  She recalls an EGD and that the ulcer was cauterized.  She does not recall history of H. pylori infection.  She was treated with PPI and remains on  omeprazole which she takes 20 mg daily.  She does have a history of heartburn but omeprazole controls this.  No dysphagia or odynophagia.  No known family history of colon cancer or IBD.  Past Medical History:  Diagnosis Date  . Diverticulitis of colon with perforation   . Fatty liver   . Hx of breast cancer   . Hypertension   . Perforated gastric ulcer ([place])     Past Surgical History:  Procedure Laterality Date  . HUMERUS FRACTURE SURGERY    . left lumpectomy    . MASS EXCISION Left    left axilla    Outpatient Medications Prior to Visit  Medication Sig Dispense Refill  . acetaminophen (TYLENOL) 500 MG tablet Take 500 mg by mouth every 6 (six) hours as needed for mild pain.    Marland Kitchen amoxicillin-clavulanate (AUGMENTIN) 875-125 MG tablet Take 1 tablet by mouth every 12 (twelve) hours for 6 days. 12 tablet 0  . cholecalciferol (VITAMIN D3) 25 MCG (1000 UNIT) tablet Take 1,000 Units by mouth daily.    Marland Kitchen losartan (COZAAR) 50 MG tablet Take 1 tablet (50 mg total) by mouth daily. 90 tablet 1  . polyethylene glycol (MIRALAX / GLYCOLAX) 17 g packet Take 17 g by mouth daily as needed for mild constipation. 14 each 0  . rosuvastatin (CRESTOR) 10 MG tablet Take 1 tablet (10 mg total) by mouth daily. 90 tablet 1  . traZODone (DESYREL) 50 MG tablet Take 0.5-1 tablets (25-50 mg total) by mouth at bedtime as needed for sleep. 90 tablet 1  .  vitamin B-12 (CYANOCOBALAMIN) 1000 MCG tablet Take 1,000 mcg by mouth daily.    . Vitamin D, Ergocalciferol, (DRISDOL) 1.25 MG (50000 UT) CAPS capsule Take 1 capsule (50,000 Units total) by mouth every 7 (seven) days. (Patient not taking: Reported on 03/20/2019) 12 capsule 1  . vitamin E (VITAMIN E) 180 MG (400 UNITS) capsule Take 400 Units by mouth daily.     No facility-administered medications prior to visit.    No Known Allergies  Family History  Problem Relation Age of Onset  . Diabetes Mother   . Breast cancer Sister     Social History   Tobacco  Use  . Smoking status: Former Research scientist (life sciences)  . Smokeless tobacco: Never Used  Substance Use Topics  . Alcohol use: Not Currently  . Drug use: No    ROS: As per history of present illness, otherwise negative  BP (!) 172/82 (BP Location: Right Arm, Patient Position: Sitting, Cuff Size: Normal)   Pulse 96   Temp 97.9 F (36.6 C)   Ht 4\' 9"  (1.448 m)   Wt 146 lb 6 oz (66.4 kg)   SpO2 98%   BMI 31.68 kg/m   Constitutional: Well-developed and well-nourished. No distress. HEENT: Normocephalic and atraumatic. Conjunctivae are normal.  No scleral icterus. Neck: Neck supple. Trachea midline. Cardiovascular: Normal rate, regular rhythm and intact distal pulses. No M/R/G Pulmonary/chest: Effort normal and breath sounds normal. No wheezing, rales or rhonchi. Abdominal: Soft, nontender, nondistended. Bowel sounds active throughout. Extremities: no clubbing, cyanosis, or edema Neurological: Alert and oriented to person place and time. Skin: Skin is warm and dry.  Psychiatric: Normal mood and affect. Behavior is normal.  RELEVANT LABS AND IMAGING: CBC    Component Value Date/Time   WBC 9.4 11/28/2019 0417   RBC 3.77 (L) 11/28/2019 0417   HGB 9.8 (L) 11/28/2019 0417   HGB 11.5 09/25/2019 1617   HCT 30.5 (L) 11/28/2019 0417   HCT 34.2 09/25/2019 1617   PLT 297 11/28/2019 0417   PLT 411 09/25/2019 1617   MCV 80.9 11/28/2019 0417   MCV 79 09/25/2019 1617   MCH 26.0 11/28/2019 0417   MCHC 32.1 11/28/2019 0417   RDW 13.3 11/28/2019 0417   RDW 13.7 09/25/2019 1617   LYMPHSABS 1.0 09/25/2019 1617   MONOABS 0.2 03/20/2019 1313   EOSABS 0.2 09/25/2019 1617   BASOSABS 0.0 09/25/2019 1617    CMP     Component Value Date/Time   NA 136 11/29/2019 0433   NA 137 09/25/2019 1617   K 4.4 11/29/2019 0433   CL 105 11/29/2019 0433   CO2 23 11/29/2019 0433   GLUCOSE 98 11/29/2019 0433   BUN 14 11/29/2019 0433   BUN 17 09/25/2019 1617   CREATININE 0.58 11/29/2019 0433   CALCIUM 8.2 (L)  11/29/2019 0433   PROT 7.2 11/24/2019 2133   PROT 7.3 09/25/2019 1617   ALBUMIN 3.7 11/24/2019 2133   ALBUMIN 4.3 09/25/2019 1617   AST 21 11/24/2019 2133   ALT 16 11/24/2019 2133   ALKPHOS 88 11/24/2019 2133   BILITOT 0.4 11/24/2019 2133   BILITOT 0.3 09/25/2019 1617   GFRNONAA >60 11/29/2019 0433   GFRAA >60 11/29/2019 0433   CT ABDOMEN AND PELVIS WITH CONTRAST   TECHNIQUE: Multidetector CT imaging of the abdomen and pelvis was performed using the standard protocol following bolus administration of intravenous contrast.   CONTRAST:  115mL OMNIPAQUE IOHEXOL 300 MG/ML  SOLN   COMPARISON:  None.   FINDINGS: Lower chest: The lung  bases are clear. The heart size is normal.   Hepatobiliary: The liver is normal. Cholelithiasis without acute inflammation.There is no biliary ductal dilation.   Pancreas: Normal contours without ductal dilatation. No peripancreatic fluid collection.   Spleen: No splenic laceration or hematoma.   Adrenals/Urinary Tract:   --Adrenal glands: No adrenal hemorrhage.   --Right kidney/ureter: No hydronephrosis or perinephric hematoma.   --Left kidney/ureter: No hydronephrosis or perinephric hematoma.   --Urinary bladder: Unremarkable.   Stomach/Bowel:   --Stomach/Duodenum: No hiatal hernia or other gastric abnormality. Normal duodenal course and caliber.   --Small bowel: No dilatation or inflammation.   --Colon: There are extensive inflammatory changes about the distal descending colon. There is no well-formed drainable fluid collection. There are pockets of pneumoperitoneum scattered throughout the abdomen.   --Appendix: Normal.   Vascular/Lymphatic: Normal course and caliber of the major abdominal vessels.   --No retroperitoneal lymphadenopathy.   --No mesenteric lymphadenopathy.   --No pelvic or inguinal lymphadenopathy.   Reproductive: There is a moderate amount of free fluid in the patient's pelvis.   Other: There is  scattered pockets of free air throughout abdomen. There is a small fat containing periumbilical hernia.   Musculoskeletal. No acute displaced fractures.   IMPRESSION: 1. Overall findings are most consistent with perforated diverticulitis involving the distal descending colon. There are scattered pockets of free air throughout the abdomen. There is a moderate amount of free fluid in the patient's pelvis without evidence for well-formed abscess. 2. Normal appendix in the right lower quadrant.     Electronically Signed   By: Constance Holster M.D.   On: 11/25/2019 01:19    ASSESSMENT/PLAN: 69 year old female with PMH of recent hospitalization for complicated diverticulitis with perforation, hypertension, history of ulcer disease, history of breast cancer who is seen to discuss her diverticulitis and consider colonoscopy.   1.  Recent descending colonic diverticulitis complicated by perforation --her perforation was treated medically with antibiotics and she looks well today.  We discussed diverticulosis, diverticulitis and complications today.  I have recommended colonoscopy but explained that this should be delayed for 6 to 8 weeks after her diverticulitis with complication.  She should complete oral antibiotic therapy with Augmentin and I advised that she let me know immediately if she has recurrent abdominal pain anytime going forward particularly when antibiotics are stopped.  Should she have recurrent abdominal pain I would recommend repeat cross-sectional imaging.  We also briefly discussed possible surgical resection given that she presented with a complication but this would likely be best discussed after colonoscopy when we exclude other luminal pathology. --Colonoscopy in the [facility] in about 6 weeks.  We discussed the risk, benefits and alternatives and she is agreeable and wishes to proceed --Complete Augmentin therapy and notify me if recurrent abdominal pain prior to  procedure    [id number], Vernia Buff, [address] [place],   [id number]

## 2019-12-04 MED FILL — SUPREP BOWEL PREP KIT: 17.5-3.13-1 | 1 days supply | Qty: 354 | Fill #0

## 2019-12-23 MED FILL — ROSUVASTATIN CALCIUM 10 MG: 10 | 30 days supply | Qty: 30 | Fill #2

## 2019-12-23 MED FILL — SUPREP BOWEL PREP KIT: 17.5-3.13-1 | 1 days supply | Qty: 354 | Fill #0

## 2019-12-30 ENCOUNTER — Other Ambulatory Visit: Payer: Self-pay

## 2019-12-30 ENCOUNTER — Ambulatory Visit: Payer: Self-pay | Attending: Nurse Practitioner | Admitting: Nurse Practitioner

## 2019-12-30 ENCOUNTER — Encounter: Payer: Self-pay | Admitting: Nurse Practitioner

## 2019-12-30 VITALS — BP 137/81

## 2019-12-30 DIAGNOSIS — R7401 Elevation of levels of liver transaminase levels: Secondary | ICD-10-CM

## 2019-12-30 DIAGNOSIS — I10 Essential (primary) hypertension: Secondary | ICD-10-CM

## 2019-12-30 DIAGNOSIS — D649 Anemia, unspecified: Secondary | ICD-10-CM

## 2019-12-30 DIAGNOSIS — E785 Hyperlipidemia, unspecified: Secondary | ICD-10-CM

## 2019-12-30 DIAGNOSIS — R7309 Other abnormal glucose: Secondary | ICD-10-CM

## 2019-12-30 MED ORDER — ROSUVASTATIN CALCIUM 10 MG PO TABS: 10.0000 mg | ORAL_TABLET | Freq: Every day | ORAL | 1 refills | Status: DC

## 2019-12-30 MED ORDER — LOSARTAN POTASSIUM 50 MG PO TABS: 50.0000 mg | ORAL_TABLET | Freq: Every day | ORAL | 1 refills | Status: DC

## 2019-12-30 MED FILL — LOSARTAN POTASSIUM 50 MG TA: 50 | 30 days supply | Qty: 30 | Fill #1

## 2019-12-30 NOTE — Addendum Note (Signed)
Addended bySteffanie Dunn on: 12/30/2019 10:45 AM   Modules accepted: Orders

## 2019-12-30 NOTE — Progress Notes (Signed)
Virtual Visit via Telephone Note Due to national recommendations of social distancing due to [place] 19, telehealth visit is felt to be most appropriate for this patient at this time.  I discussed the limitations, risks, security and privacy concerns of performing an evaluation and management service by telephone and the availability of in person appointments. I also discussed with the patient that there may be a patient responsible charge related to this service. The patient expressed understanding and agreed to proceed.    I connected with Amit Dietz on 12/30/19  at   8:50 AM EDT  EDT by telephone and verified that I am speaking with the correct person using two identifiers.   Consent I discussed the limitations, risks, security and privacy concerns of performing an evaluation and management service by telephone and the availability of in person appointments. I also discussed with the patient that there may be a patient responsible charge related to this service. The patient expressed understanding and agreed to proceed.   Location of Patient: Private Residence   Location of Provider: [place] [facility] [employer] Office    Persons participating in Telemedicine visit: Geryl Rankins FNP-BC [place]  Spanish InterpreterEduardo [place] #[id number]    History of Present Illness: Telemedicine visit for: Essential Hypertension Denies chest pain, shortness of breath, palpitations, lightheadedness, dizziness, headaches or BLE edema. She takes losartan 50 mg daily as prescribed. She checked her blood pressure during the televisit today: 137/81 HR 82.  BP Readings from Last 3 Encounters:  12/30/19 137/81  12/03/19 (!) 172/82  11/29/19 136/76      Past Medical History:  Diagnosis Date  . Diverticulitis of colon with perforation   . Fatty liver   . Hx of breast cancer   . Hypertension   . Perforated gastric ulcer ([place])     Past Surgical History:  Procedure Laterality  Date  . HUMERUS FRACTURE SURGERY    . left lumpectomy    . MASS EXCISION Left    left axilla    Family History  Problem Relation Age of Onset  . Diabetes Mother   . Breast cancer Sister   . Pancreatic cancer Cousin   . Pancreatic cancer Cousin   . Colon cancer Neg Hx     Social History   Socioeconomic History  . Marital status: Single    Spouse name: Not on file  . Number of children: 2  . Years of education: Not on file  . Highest education level: 10th grade  Occupational History  . Not on file  Tobacco Use  . Smoking status: Former Smoker    Types: Cigarettes  . Smokeless tobacco: Never Used  Substance and Sexual Activity  . Alcohol use: Not Currently  . Drug use: No  . Sexual activity: Not on file  Other Topics Concern  . Not on file  Social History Narrative  . Not on file   Social Determinants of Health   Financial Resource Strain:   . Difficulty of Paying Living Expenses:   Food Insecurity:   . Worried About Charity fundraiser in the Last Year:   . Arboriculturist in the Last Year:   Transportation Needs: No Transportation Needs  . Lack of Transportation (Medical): No  . Lack of Transportation (Non-Medical): No  Physical Activity:   . Days of Exercise per Week:   . Minutes of Exercise per Session:   Stress:   . Feeling of Stress :   Social Connections:   .  Frequency of Communication with Friends and Family:   . Frequency of Social Gatherings with Friends and Family:   . Attends Religious Services:   . Active Member of Clubs or Organizations:   . Attends Archivist Meetings:   Marland Kitchen Marital Status:      Observations/Objective: Awake, alert and oriented x 3   Review of Systems  Constitutional: Negative for fever, malaise/fatigue and weight loss.  HENT: Negative.  Negative for nosebleeds.   Eyes: Negative.  Negative for blurred vision, double vision and photophobia.  Respiratory: Negative.  Negative for cough and shortness of breath.    Cardiovascular: Negative.  Negative for chest pain, palpitations and leg swelling.  Gastrointestinal: Negative.  Negative for heartburn, nausea and vomiting.  Musculoskeletal: Negative.  Negative for myalgias.  Neurological: Negative.  Negative for dizziness, focal weakness, seizures and headaches.  Psychiatric/Behavioral: Negative.  Negative for suicidal ideas.    Assessment and Plan: Niemah was seen today for follow-up.  Diagnoses and all orders for this visit:  Essential hypertension -     losartan (COZAAR) 50 MG tablet; Take 1 tablet (50 mg total) by mouth daily. Continue all antihypertensives as prescribed.  Remember to bring in your blood pressure log with you for your follow up appointment.  DASH/Mediterranean Diets are healthier choices for HTN.    Hyperlipidemia, unspecified hyperlipidemia type -     rosuvastatin (CRESTOR) 10 MG tablet; Take 1 tablet (10 mg total) by mouth daily. -     Lipid panel; Future INSTRUCTIONS: Work on a low fat, heart healthy diet and participate in regular aerobic exercise program by working out at least 150 minutes per week; 5 days a week-30 minutes per day. Avoid red meat/beef/steak,  fried foods. junk foods, sodas, sugary drinks, unhealthy snacking, alcohol and smoking.  Drink at least 80 oz of water per day and monitor your carbohydrate intake daily.    Transaminitis -     Hepatic Function Panel; Future  Elevated glucose -     Hemoglobin A1c; Future  Anemia, unspecified type -     CBC with Differential; Future -     Iron, TIBC and Ferritin Panel; Future     Follow Up Instructions Return in about 3 months (around 03/31/2020).     I discussed the assessment and treatment plan with the patient. The patient was provided an opportunity to ask questions and all were answered. The patient agreed with the plan and demonstrated an understanding of the instructions.   The patient was advised to call back or seek an in-person evaluation if the  symptoms worsen or if the condition fails to improve as anticipated.  I provided 17 minutes of non-face-to-face time during this encounter including median intraservice time, reviewing previous notes, labs, imaging, medications and explaining diagnosis and management.  [name], FNP-BC

## 2019-12-31 LAB — CBC WITH DIFFERENTIAL/PLATELET
Basophils Absolute: 0 10*3/uL (ref 0.0–0.2)
Basos: 0 %
EOS (ABSOLUTE): 0.2 10*3/uL (ref 0.0–0.4)
Eos: 2 %
Hematocrit: 34.2 % (ref 34.0–46.6)
Hemoglobin: 11 g/dL — ABNORMAL LOW (ref 11.1–15.9)
Immature Grans (Abs): 0 10*3/uL (ref 0.0–0.1)
Immature Granulocytes: 1 %
Lymphocytes Absolute: 1 10*3/uL (ref 0.7–3.1)
Lymphs: 14 %
MCH: 26.4 pg — ABNORMAL LOW (ref 26.6–33.0)
MCHC: 32.2 g/dL (ref 31.5–35.7)
MCV: 82 fL (ref 79–97)
Monocytes Absolute: 0.6 10*3/uL (ref 0.1–0.9)
Monocytes: 8 %
Neutrophils Absolute: 5.6 10*3/uL (ref 1.4–7.0)
Neutrophils: 75 %
Platelets: 336 10*3/uL (ref 150–450)
RBC: 4.16 x10E6/uL (ref 3.77–5.28)
RDW: 13.2 % (ref 11.7–15.4)
WBC: 7.4 10*3/uL (ref 3.4–10.8)

## 2019-12-31 LAB — IRON,TIBC AND FERRITIN PANEL
Ferritin: 200 ng/mL — ABNORMAL HIGH (ref 15–150)
Iron Saturation: 20 % (ref 15–55)
Iron: 53 ug/dL (ref 27–139)
Total Iron Binding Capacity: 271 ug/dL (ref 250–450)
UIBC: 218 ug/dL (ref 118–369)

## 2019-12-31 LAB — HEPATIC FUNCTION PANEL
ALT: 8 IU/L (ref 0–32)
AST: 18 IU/L (ref 0–40)
Albumin: 4.3 g/dL (ref 3.8–4.8)
Alkaline Phosphatase: 118 IU/L — ABNORMAL HIGH (ref 39–117)
Bilirubin Total: 0.4 mg/dL (ref 0.0–1.2)
Bilirubin, Direct: 0.14 mg/dL (ref 0.00–0.40)
Total Protein: 7.6 g/dL (ref 6.0–8.5)

## 2019-12-31 LAB — LIPID PANEL
Chol/HDL Ratio: 2.2 ratio (ref 0.0–4.4)
Cholesterol, Total: 92 mg/dL — ABNORMAL LOW (ref 100–199)
HDL: 42 mg/dL (ref >39)
LDL Chol Calc (NIH): 23 mg/dL (ref 0–99)
Triglycerides: 162 mg/dL — ABNORMAL HIGH (ref 0–149)
VLDL Cholesterol Cal: 27 mg/dL (ref 5–40)

## 2019-12-31 LAB — HEMOGLOBIN A1C
Est. average glucose Bld gHb Est-mCnc: 114 mg/dL
Hgb A1c MFr Bld: 5.6 % (ref 4.8–5.6)

## 2020-01-07 ENCOUNTER — Other Ambulatory Visit: Payer: Self-pay | Admitting: Nurse Practitioner

## 2020-01-07 DIAGNOSIS — Z1231 Encounter for screening mammogram for malignant neoplasm of breast: Secondary | ICD-10-CM

## 2020-01-23 ENCOUNTER — Encounter: Payer: No Typology Code available for payment source | Admitting: Internal Medicine

## 2020-01-31 ENCOUNTER — Other Ambulatory Visit: Payer: Self-pay

## 2020-01-31 ENCOUNTER — Ambulatory Visit (INDEPENDENT_AMBULATORY_CARE_PROVIDER_SITE_OTHER): Payer: Self-pay

## 2020-01-31 ENCOUNTER — Other Ambulatory Visit: Payer: Self-pay | Admitting: Internal Medicine

## 2020-01-31 DIAGNOSIS — Z1159 Encounter for screening for other viral diseases: Secondary | ICD-10-CM

## 2020-02-01 LAB — SARS CORONAVIRUS 2 (TAT 6-24 HRS): SARS Coronavirus 2: NEGATIVE

## 2020-02-03 ENCOUNTER — Other Ambulatory Visit: Payer: Self-pay

## 2020-02-03 ENCOUNTER — Encounter: Payer: Self-pay | Admitting: Internal Medicine

## 2020-02-03 ENCOUNTER — Ambulatory Visit (AMBULATORY_SURGERY_CENTER): Payer: Self-pay | Admitting: Internal Medicine

## 2020-02-03 VITALS — BP 152/57 | HR 61 | Temp 97.1°F | Resp 10 | Ht <= 58 in | Wt 146.0 lb

## 2020-02-03 DIAGNOSIS — D125 Benign neoplasm of sigmoid colon: Secondary | ICD-10-CM

## 2020-02-03 DIAGNOSIS — D124 Benign neoplasm of descending colon: Secondary | ICD-10-CM

## 2020-02-03 DIAGNOSIS — Z8719 Personal history of other diseases of the digestive system: Secondary | ICD-10-CM

## 2020-02-03 DIAGNOSIS — K5730 Diverticulosis of large intestine without perforation or abscess without bleeding: Secondary | ICD-10-CM

## 2020-02-03 DIAGNOSIS — K648 Other hemorrhoids: Secondary | ICD-10-CM

## 2020-02-03 MED ORDER — SODIUM CHLORIDE 0.9 % IV SOLN: 500.0000 mL | Freq: Once | INTRAVENOUS | Status: DC

## 2020-02-03 NOTE — Progress Notes (Signed)
Temp by Ledora Bottcher by [place]    Admission completed with interprter Claudia.

## 2020-02-03 NOTE — Op Note (Signed)
[facility] Patient Name: Maria Dominguez Procedure Date: 02/03/2020 2:03 PM MRN: [id number] Endoscopist: Jerene Bears , MD Age: 69 Referring MD:  Date of Birth: [birth year] Gender: Female Account #: [id number] Procedure:                Colonoscopy Indications:              This is the patient's first colonoscopy, Follow-up                            of diverticulitis diagnosed by CT with small                            perforation treated medically in Feb 2021 Medicines:                Monitored Anesthesia Care Procedure:                Pre-Anesthesia Assessment:                           - Prior to the procedure, a History and Physical                            was performed, and patient medications and                            allergies were reviewed. The patient's tolerance of                            previous anesthesia was also reviewed. The risks                            and benefits of the procedure and the sedation                            options and risks were discussed with the patient.                            All questions were answered, and informed consent                            was obtained. Prior Anticoagulants: The patient has                            taken no previous anticoagulant or antiplatelet                            agents. ASA Grade Assessment: II - A patient with                            mild systemic disease. After reviewing the risks                            and benefits, the patient was deemed in  satisfactory condition to undergo the procedure.                           After obtaining informed consent, the colonoscope                            was passed under direct vision. Throughout the                            procedure, the patient's blood pressure, pulse, and                            oxygen saturations were monitored continuously. The                            Colonoscope was  introduced through the anus and                            advanced to the cecum, identified by appendiceal                            orifice and ileocecal valve. The colonoscopy was                            performed without difficulty. The patient tolerated                            the procedure well. The quality of the bowel                            preparation was good. The ileocecal valve,                            appendiceal orifice, and rectum were photographed. Scope In: 2:14:41 PM Scope Out: 2:38:06 PM Scope Withdrawal Time: 0 hours 14 minutes 11 seconds  Total Procedure Duration: 0 hours 23 minutes 25 seconds  Findings:                 Hemorrhoids and skin tags were found on perianal                            exam.                           Two sessile polyps were found in the descending                            colon. The polyps were 4 to 5 mm in size. These                            polyps were removed with a cold snare. Resection                            and retrieval were complete.  A 14 mm polyp was found in the sigmoid colon. The                            polyp was semi-pedunculated. The polyp was removed                            with a hot snare. Resection and retrieval were                            complete.                           Multiple small and large-mouthed diverticula were                            found in the sigmoid colon, descending colon,                            hepatic flexure and ascending colon. There was                            narrowing of the colon in association with the                            diverticular opening in the left colon (distal                            descending colon). This narrowing is mild, short                            segment and not obstructive. No evidence of active                            inflammation or colitis.                           External and internal  hemorrhoids were found during                            retroflexion and during digital exam. Complications:            No immediate complications. Estimated Blood Loss:     Estimated blood loss was minimal. Impression:               - Two 4 to 5 mm polyps in the descending colon,                            removed with a cold snare. Resected and retrieved.                           - One 14 mm polyp in the sigmoid colon, removed                            with a hot snare. Resected and retrieved.                           -  Moderate diverticulosis in the sigmoid colon, in                            the descending colon, at the hepatic flexure and in                            the ascending colon. There was mild narrowing of                            the colon in association with the diverticular                            opening in the distal descending colon.                           - External and internal hemorrhoids. Recommendation:           - Patient has a contact number available for                            emergencies. The signs and symptoms of potential                            delayed complications were discussed with the                            patient. Return to normal activities tomorrow.                            Written discharge instructions were provided to the                            patient.                           - Resume previous diet.                           - Continue present medications.                           - Avoid NSAIDs for at least 2 weeks after                            polypectomy.                           - Await pathology results.                           - Repeat colonoscopy is recommended for                            surveillance. The colonoscopy date will be  determined after pathology results from today's                            exam become available for review. Jerene Bears, MD 02/03/2020  2:45:15 PM This report has been signed electronically.

## 2020-02-03 NOTE — Patient Instructions (Addendum)
Information on polyps, diverticulosis and hemorrhoids given to you today.  Await pathology results.  Resume previous diet.  Avoid NSAIDS (Aspirin, Ibuprofen, Aleve, Naproxen), you may use Tylenol as needed for the next 2 weeks.  USTED TUVO UN PROCEDIMIENTO ENDOSCPICO HOY EN EL [facility]:   Lea el informe del procedimiento que se le entreg para cualquier pregunta especfica sobre lo que se Primary school teacher.  Si el informe del examen no responde a sus preguntas, por favor llame a su gastroenterlogo para aclararlo.  Si usted solicit que no se le den [employer] de lo que se Estate manager/land agent en su procedimiento al [employer] va a cuidar, entonces el informe del procedimiento se ha incluido en un sobre sellado para que usted lo revise despus cuando le sea ms conveniente.   LO QUE PUEDE ESPERAR: Algunas sensaciones de hinchazn en el abdomen.  Puede tener ms gases de lo normal.  El caminar puede ayudarle a eliminar el aire que se le puso en el tracto gastrointestinal durante el procedimiento y reducir la hinchazn.  Si le hicieron una endoscopia inferior (como una colonoscopia o una sigmoidoscopia flexible), podra notar manchas de sangre en las heces fecales o en el papel higinico.  Si se someti a una preparacin intestinal para su procedimiento, es posible que no tenga una evacuacin intestinal normal durante [employer].   Tenga en cuenta:  Es posible que note un poco de irritacin y congestin en la nariz o algn drenaje.  Esto es debido al oxgeno [employer] durante su procedimiento.  No hay que preocuparse y esto debe desaparecer ms o Scientist, research (medical).   SNTOMAS PARA REPORTAR INMEDIATAMENTE:  Despus de una endoscopia inferior (colonoscopia o sigmoidoscopia flexible):  Cantidades excesivas de sangre en las heces fecales  Sensibilidad significativa o empeoramiento de los dolores abdominales   Hinchazn aguda del abdomen que antes no tena   Fiebre de 100F o ms    Despus de la endoscopia superior (EGD)  Vmitos de Biochemist, clinical o material como caf molido   Dolor en el pecho o dolor debajo de los omplatos que antes no tena   Dolor o dificultad persistente para tragar  Falta de aire que antes no tena   Fiebre de 100F o ms  Heces fecales negras y pegajosas   Para asuntos urgentes o de Freight forwarder, puede comunicarse con un gastroenterlogo a cualquier hora llamando al [phone].  DIETA:  Recomendamos una comida pequea al principio, pero luego puede continuar con su dieta normal.  Tome muchos lquidos, Teacher, adult education las bebidas alcohlicas durante 24 horas.    ACTIVIDAD:  Debe planear tomarse las cosas con calma por el resto del da y no debe CONDUCIR ni usar maquinaria pesada Programmer, applications (debido a los medicamentos de sedacin utilizados durante el examen).     SEGUIMIENTO: Nuestro personal llamar al nmero que aparece en su historial al siguiente da hbil de su procedimiento para ver cmo se siente y para responder cualquier pregunta o inquietud que pueda tener con respecto a la informacin que se le dio despus del procedimiento. Si no podemos contactarle, le dejaremos un mensaje.  Sin embargo, si se siente bien y no tiene Paediatric nurse, no es necesario que nos devuelva la llamada.  Asumiremos que ha regresado a sus actividades diarias normales sin incidentes. Si se le tomaron algunas biopsias, le contactaremos por telfono o por carta en las prximas 3 semanas.  Si no ha sabido [employer]  transcurso de 3 semanas, por favor llmenos al [phone].   FIRMAS/CONFIDENCIALIDAD: Usted y/o el acompaante que le cuide han firmado documentos que se ingresarn en su historial mdico electrnico.  Estas firmas atestiguan el hecho de que la informacin anterior

## 2020-02-03 NOTE — Progress Notes (Signed)
A and O x3. Report to RN. Tolerated MAC anesthesia well.

## 2020-02-03 NOTE — Progress Notes (Signed)
Called to room to assist during endoscopic procedure.  Patient ID and intended procedure confirmed with present staff. Received instructions for my participation in the procedure from the performing physician.  

## 2020-02-05 ENCOUNTER — Other Ambulatory Visit (INDEPENDENT_AMBULATORY_CARE_PROVIDER_SITE_OTHER): Payer: No Typology Code available for payment source

## 2020-02-05 ENCOUNTER — Other Ambulatory Visit: Payer: Self-pay

## 2020-02-05 ENCOUNTER — Ambulatory Visit (INDEPENDENT_AMBULATORY_CARE_PROVIDER_SITE_OTHER)
Admission: RE | Admit: 2020-02-05 | Discharge: 2020-02-05 | Disposition: A | Discharge status: Home / Self Care | Payer: No Typology Code available for payment source | Source: Ambulatory Visit | Attending: Internal Medicine | Admitting: Internal Medicine

## 2020-02-05 ENCOUNTER — Telehealth: Payer: Self-pay | Admitting: *Deleted

## 2020-02-05 DIAGNOSIS — R109 Unspecified abdominal pain: Secondary | ICD-10-CM

## 2020-02-05 LAB — COMPREHENSIVE METABOLIC PANEL
ALT: 7 U/L (ref 0–35)
AST: 12 U/L (ref 0–37)
Albumin: 3.8 g/dL (ref 3.5–5.2)
Alkaline Phosphatase: 85 U/L (ref 39–117)
BUN: 16 mg/dL (ref 6–23)
CO2: 25 mEq/L (ref 19–32)
Calcium: 8.5 mg/dL (ref 8.4–10.5)
Chloride: 101 mEq/L (ref 96–112)
Creatinine, Ser: 0.66 mg/dL (ref 0.40–1.20)
GFR: 88.66 mL/min (ref >60.00)
Glucose, Bld: 126 mg/dL — ABNORMAL HIGH (ref 70–99)
Potassium: 3.5 mEq/L (ref 3.5–5.1)
Sodium: 132 mEq/L — ABNORMAL LOW (ref 135–145)
Total Bilirubin: 0.9 mg/dL (ref 0.2–1.2)
Total Protein: 7.5 g/dL (ref 6.0–8.3)

## 2020-02-05 MED ORDER — AMOXICILLIN-POT CLAVULANATE 875-125 MG PO TABS: 1.0000 | ORAL_TABLET | Freq: Two times a day (BID) | ORAL | 0 refills | Status: DC

## 2020-02-05 MED FILL — AMOX-CLAV 875-125 MG TABLET: 875-125 | 7 days supply | Qty: 14 | Fill #0

## 2020-02-05 NOTE — Telephone Encounter (Signed)
Pt needs to come to the lab in the basement of our office building today and then go to the xray department in the basement today before 5pm.   Pt needs to go today to the xray department on the first floor at [facility] to pick up her contrast for CT scan. She will need to arrive in the xray department at [facility] tomorrow 4/29 at 12:15, CT will be done at 12:30pm. She cannot have anything to eat or drink tomorrow after 8:30am, except she will drink bottle 1 of contrast at 10:30am and bottle 2 at 11:30am.

## 2020-02-05 NOTE — Telephone Encounter (Signed)
Would have patient come for 2 v abd xray, CBC, CMP Would also repeat CT scan abd/pelvis (if the CT can be done today, then can skip the xray), but if not then proceed with xray  I will have low threshold for abx given recent diverticulitis

## 2020-02-05 NOTE — Telephone Encounter (Signed)
Pts daughter aware and will bring pt for xray and labs today and CT tomorrow.

## 2020-02-05 NOTE — Telephone Encounter (Signed)
Pt called and message left for her to call back.

## 2020-02-05 NOTE — Telephone Encounter (Signed)
  Follow up Call-  Call back number 02/03/2020  Post procedure Call Back phone  # [phone]-   daughter [name]  Permission to leave phone message Yes     Patient questions:  Do you have a fever, pain , or abdominal swelling? No. Pain Score  9 *  Have you tolerated food without any problems? No. only eating broth  Have you been able to return to your normal activities? No. related to pain  Do you have any questions about your discharge instructions: Diet   No. Medications  No. Follow up visit  Yes.    Do you have questions or concerns about your Care? Yes.  pt is having 9/10 left sided pain since Aashvi Rezabek pm after her procedure. She is not ambulating much due to her pain and has not returned to work. She and her daughter are spanish speaking and this information was obtained with D[employer] Lodge interpreting. Will let Dr. Hilarie Fredrickson know.  Actions: * If pain score is 4 or above: Physician/ provider Notified : Zenovia Jarred, MD.

## 2020-02-06 ENCOUNTER — Other Ambulatory Visit: Payer: Self-pay

## 2020-02-06 ENCOUNTER — Ambulatory Visit (HOSPITAL_COMMUNITY)
Admission: RE | Admit: 2020-02-06 | Discharge: 2020-02-06 | Disposition: A | Discharge status: Home / Self Care | Payer: Self-pay | Source: Ambulatory Visit | Attending: Internal Medicine | Admitting: Internal Medicine

## 2020-02-06 DIAGNOSIS — R109 Unspecified abdominal pain: Secondary | ICD-10-CM | POA: Insufficient documentation

## 2020-02-06 LAB — CBC WITH DIFFERENTIAL/PLATELET
Basophils Absolute: 0 10*3/uL (ref 0.0–0.1)
Basophils Relative: 0.2 % (ref 0.0–3.0)
Eosinophils Absolute: 0 10*3/uL (ref 0.0–0.7)
Eosinophils Relative: 0.2 % (ref 0.0–5.0)
HCT: 30.6 % — ABNORMAL LOW (ref 36.0–46.0)
Hemoglobin: 9.8 g/dL — ABNORMAL LOW (ref 12.0–15.0)
Lymphocytes Relative: 4.6 % — ABNORMAL LOW (ref 12.0–46.0)
Lymphs Abs: 0.6 10*3/uL — ABNORMAL LOW (ref 0.7–4.0)
MCHC: 32.1 g/dL (ref 30.0–36.0)
MCV: 80.8 fl (ref 78.0–100.0)
Monocytes Absolute: 1 10*3/uL (ref 0.1–1.0)
Monocytes Relative: 7.4 % (ref 3.0–12.0)
Neutro Abs: 12.1 10*3/uL — ABNORMAL HIGH (ref 1.4–7.7)
Neutrophils Relative %: 87.6 % — ABNORMAL HIGH (ref 43.0–77.0)
Platelets: 369 10*3/uL (ref 150.0–400.0)
RBC: 3.78 Mil/uL — ABNORMAL LOW (ref 3.87–5.11)
RDW: 14.2 % (ref 11.5–15.5)
WBC: 13.8 10*3/uL — ABNORMAL HIGH (ref 4.0–10.5)

## 2020-02-06 MED ORDER — AMOXICILLIN-POT CLAVULANATE 875-125 MG PO TABS
1.0000 | ORAL_TABLET | Freq: Two times a day (BID) | ORAL | 0 refills | Status: DC
Start: 2020-02-06 — End: 2020-03-31

## 2020-02-06 MED ORDER — IOHEXOL 300 MG/ML  SOLN
100.0000 mL | Freq: Once | INTRAMUSCULAR | Status: AC | PRN
  Administered 2020-02-06: 100 mL via INTRAVENOUS

## 2020-02-06 MED ORDER — SODIUM CHLORIDE (PF) 0.9 % IJ SOLN
INTRAMUSCULAR | Status: AC
  Filled 2020-02-06: qty 50

## 2020-02-12 MED FILL — AMOX-CLAV 875-125 MG TABLET: 875-125 | 7 days supply | Qty: 14 | Fill #0

## 2020-03-10 ENCOUNTER — Other Ambulatory Visit: Payer: Self-pay | Admitting: General Surgery

## 2020-03-10 MED ORDER — BUPIVACAINE LIPOSOME 1.3 % IJ SUSP
20.0000 mL | Freq: Once | INTRAMUSCULAR | Status: AC
Start: 2020-03-10 — End: ?

## 2020-03-10 MED ORDER — SODIUM CHLORIDE 0.9 % IV SOLN: INTRAVENOUS | Status: AC

## 2020-03-10 MED FILL — AMOX-CLAV 875-125 MG TABLET: 875-125 | 14 days supply | Qty: 28 | Fill #0

## 2020-03-10 MED FILL — NEOMYCIN 500 MG TABLET: 500 | 1 days supply | Qty: 6 | Fill #0

## 2020-03-10 MED FILL — metroNIDAZOLE 500 MG TABS: 500 | 1 days supply | Qty: 6 | Fill #0

## 2020-03-23 MED FILL — LOSARTAN POTASSIUM 50 MG TA: 50 | 30 days supply | Qty: 30 | Fill #3

## 2020-03-23 MED FILL — NEOMYCIN 500 MG TABLET: 500 | 1 days supply | Qty: 6 | Fill #0

## 2020-03-23 MED FILL — AMOX-CLAV 875-125 MG TABLET: 875-125 | 14 days supply | Qty: 28 | Fill #0

## 2020-03-23 MED FILL — metroNIDAZOLE 500 MG TABS: 500 | 1 days supply | Qty: 6 | Fill #0

## 2020-03-31 ENCOUNTER — Ambulatory Visit: Payer: Self-pay | Attending: Nurse Practitioner | Admitting: Nurse Practitioner

## 2020-03-31 ENCOUNTER — Other Ambulatory Visit: Payer: Self-pay | Admitting: Nurse Practitioner

## 2020-03-31 ENCOUNTER — Encounter: Payer: Self-pay | Admitting: Nurse Practitioner

## 2020-03-31 ENCOUNTER — Other Ambulatory Visit: Payer: Self-pay

## 2020-03-31 VITALS — BP 133/83 | HR 71 | Temp 97.7°F | Ht 62.0 in | Wt 140.0 lb

## 2020-03-31 DIAGNOSIS — E785 Hyperlipidemia, unspecified: Secondary | ICD-10-CM

## 2020-03-31 DIAGNOSIS — F5101 Primary insomnia: Secondary | ICD-10-CM

## 2020-03-31 DIAGNOSIS — I10 Essential (primary) hypertension: Secondary | ICD-10-CM

## 2020-03-31 DIAGNOSIS — M2550 Pain in unspecified joint: Secondary | ICD-10-CM

## 2020-03-31 MED ORDER — TRAZODONE HCL 50 MG PO TABS: 25.0000 mg | ORAL_TABLET | Freq: Every evening | ORAL | 3 refills | Status: DC | PRN

## 2020-03-31 MED ORDER — ROSUVASTATIN CALCIUM 10 MG PO TABS: 10.0000 mg | ORAL_TABLET | Freq: Every day | ORAL | 1 refills | Status: DC

## 2020-03-31 MED ORDER — LOSARTAN POTASSIUM 50 MG PO TABS: 50.0000 mg | ORAL_TABLET | Freq: Every day | ORAL | 1 refills | Status: DC

## 2020-03-31 MED ORDER — DICLOFENAC SODIUM 1 % EX GEL: 2.0000 g | Freq: Four times a day (QID) | CUTANEOUS | 1 refills | Status: AC

## 2020-03-31 MED FILL — ROSUVASTATIN CALCIUM 10 MG: 10 | 90 days supply | Qty: 90 | Fill #0

## 2020-03-31 MED FILL — DICLOFENAC SODIUM 1% GEL: 1 | 12 days supply | Qty: 100 | Fill #0

## 2020-03-31 MED FILL — traZODone HCL 50 MG TABS: 50 | 90 days supply | Qty: 90 | Fill #0

## 2020-03-31 NOTE — Progress Notes (Signed)
Assessment & Plan:  Maria Dominguez was seen today for follow-up.  Diagnoses and all orders for this visit:  Essential hypertension -     CMP14+EGFR -     losartan (COZAAR) 50 MG tablet; Take 1 tablet (50 mg total) by mouth daily. -     Basic metabolic panel Continue all antihypertensives as prescribed.  Remember to bring in your blood pressure log with you for your follow up appointment.  DASH/Mediterranean Diets are healthier choices for HTN.    Hyperlipidemia, unspecified hyperlipidemia type -     rosuvastatin (CRESTOR) 10 MG tablet; Take 1 tablet (10 mg total) by mouth daily. INSTRUCTIONS: Work on a low fat, heart healthy diet and participate in regular aerobic exercise program by working out at least 150 minutes per week; 5 days a week-30 minutes per day. Avoid red meat/beef/steak,  fried foods. junk foods, sodas, sugary drinks, unhealthy snacking, alcohol and smoking.  Drink at least 80 oz of water per day and monitor your carbohydrate intake daily.    Primary insomnia -     traZODone (DESYREL) 50 MG tablet; Take 0.5-1 tablets (25-50 mg total) by mouth at bedtime as needed for sleep.  Arthralgia of multiple joints -     diclofenac Sodium (VOLTAREN) 1 % GEL; Apply 2 g topically 4 (four) times daily. Work on losing weight to help reduce joint pain. May alternate with heat and ice application for pain relief. Other alternatives include massage, acupuncture and water aerobics.  You must stay active and avoid a sedentary lifestyle.   Patient has been counseled on age-appropriate routine health concerns for screening and prevention. These are reviewed and up-to-date. Referrals have been placed accordingly. Immunizations are up-to-date or declined.    Subjective:   Chief Complaint  Patient presents with  . Follow-up    Pt. is here for HTN follow up. Pt. stated she is having trouble sleeping.    HPI Maria Dominguez 69 y.o. female presents to office today for follow up.  has a past medical  history of Diverticulitis of colon with perforation, Fatty liver, breast cancer, Hyperlipidemia, Hypertension, and Perforated gastric ulcer ([place]).  VRI was used to communicate directly with patient for the entire encounter including providing detailed patient instructions.    Essential Hypertension Well controlled. She endorses medication compliance taking losartan 50 mg daily.  Denies chest pain, shortness of breath, palpitations, lightheadedness, dizziness, headaches or BLE edema.   BP Readings from Last 3 Encounters:  03/31/20 133/83  02/03/20 (!) 152/57  12/30/19 137/81   Insomnia She does note insomnia. Onset over a year ago. Difficulty staying asleep. She was prescribed trazodone last year for insomnia but states she stopped taking it because she did not want to become addicted to it. I did explain to her that it may be helpful to continue it in regard to her insomnia   Joint/Muscle Pain: Patient complains of arthralgias for which has been present for several months. Pain is located in multiple joints, is described as aching and tight band, and is intermittent, mild .  Associated symptoms include: none.  The patient has tried nothing for pain relief.  Related to injury:   No. Pain is worse in the right hip and lower legs. Not related to walking or activity.   Review of Systems  Constitutional: Negative for fever, malaise/fatigue and weight loss.  HENT: Negative.  Negative for nosebleeds.   Eyes: Negative.  Negative for blurred vision, double vision and photophobia.  Respiratory: Negative.  Negative for  cough and shortness of breath.   Cardiovascular: Negative.  Negative for chest pain, palpitations and leg swelling.  Gastrointestinal: Negative.  Negative for heartburn, nausea and vomiting.  Musculoskeletal: Positive for joint pain and myalgias.  Neurological: Negative.  Negative for dizziness, focal weakness, seizures and headaches.  Psychiatric/Behavioral: Negative for suicidal ideas.  The patient has insomnia.     Past Medical History:  Diagnosis Date  . Diverticulitis of colon with perforation   . Fatty liver   . Hx of breast cancer   . Hyperlipidemia   . Hypertension   . Perforated gastric ulcer ([place])     Past Surgical History:  Procedure Laterality Date  . HUMERUS FRACTURE SURGERY    . left lumpectomy    . MASS EXCISION Left    left axilla    Family History  Problem Relation Age of Onset  . Diabetes Mother   . Breast cancer Sister   . Pancreatic cancer Cousin   . Pancreatic cancer Cousin   . Colon cancer Neg Hx   . Esophageal cancer Neg Hx   . Rectal cancer Neg Hx   . Stomach cancer Neg Hx     Social History Reviewed with no changes to be made today.   Outpatient Medications Prior to Visit  Medication Sig Dispense Refill  . acetaminophen (TYLENOL) 500 MG tablet Take 500 mg by mouth every 6 (six) hours as needed for mild pain.    . cholecalciferol (VITAMIN D3) 25 MCG (1000 UNIT) tablet Take 1,000 Units by mouth daily.    Marland Kitchen omeprazole (PRILOSEC OTC) 20 MG tablet Take 20 mg by mouth daily.    . vitamin B-12 (CYANOCOBALAMIN) 1000 MCG tablet Take 1,000 mcg by mouth daily.    . vitamin E (VITAMIN E) 180 MG (400 UNITS) capsule Take 400 Units by mouth daily.    Marland Kitchen amoxicillin-clavulanate (AUGMENTIN) 875-125 MG tablet Take 1 tablet by mouth 2 (two) times daily. 14 tablet 0  . losartan (COZAAR) 50 MG tablet Take 1 tablet (50 mg total) by mouth daily. 90 tablet 1  . rosuvastatin (CRESTOR) 10 MG tablet Take 1 tablet (10 mg total) by mouth daily. 90 tablet 1   Facility-Administered Medications Prior to Visit  Medication Dose Route Frequency Provider Last Rate Last Admin  . bupivacaine liposome (EXPAREL) 1.3 % injection 266 mg  20 mL Infiltration Once Stark Klein, MD        No Known Allergies     Objective:    BP 133/83 (BP Location: Left Arm, Patient Position: Sitting, Cuff Size: Normal)   Pulse 71   Temp 97.7 F (36.5 C) (Temporal)   Ht '5\' 2"'$   (1.575 m)   Wt 140 lb (63.5 kg)   SpO2 96%   BMI 25.61 kg/m  Wt Readings from Last 3 Encounters:  03/31/20 140 lb (63.5 kg)  02/03/20 146 lb (66.2 kg)  12/03/19 146 lb 6 oz (66.4 kg)    Physical Exam Vitals and nursing note reviewed.  Constitutional:      Appearance: She is well-developed.  HENT:     Head: Normocephalic and atraumatic.  Cardiovascular:     Rate and Rhythm: Normal rate and regular rhythm.     Heart sounds: Normal heart sounds. No murmur heard.  No friction rub. No gallop.   Pulmonary:     Effort: Pulmonary effort is normal. No tachypnea or respiratory distress.     Breath sounds: Normal breath sounds. No decreased breath sounds, wheezing, rhonchi or rales.  Chest:  Chest wall: No tenderness.  Abdominal:     General: Bowel sounds are normal.     Palpations: Abdomen is soft.  Musculoskeletal:        General: Normal range of motion.     Cervical back: Normal range of motion.  Skin:    General: Skin is warm and dry.  Neurological:     Mental Status: She is alert and oriented to person, place, and time.     Coordination: Coordination normal.  Psychiatric:        Behavior: Behavior normal. Behavior is cooperative.        Thought Content: Thought content normal.        Judgment: Judgment normal.          Patient has been counseled extensively about nutrition and exercise as well as the importance of adherence with medications and regular follow-up. The patient was given clear instructions to go to ER or return to medical center if symptoms don't improve, worsen or new problems develop. The patient verbalized understanding.   Follow-up: Return in about 3 months (around 07/01/2020).   [name], FNP-BC [facility] [place], [place]   03/31/2020, 1:46 PM

## 2020-04-01 LAB — CMP14+EGFR
ALT: 11 IU/L (ref 0–32)
AST: 16 IU/L (ref 0–40)
Albumin/Globulin Ratio: 1.1 — ABNORMAL LOW (ref 1.2–2.2)
Albumin: 4 g/dL (ref 3.8–4.8)
Alkaline Phosphatase: 134 IU/L — ABNORMAL HIGH (ref 48–121)
BUN/Creatinine Ratio: 30 — ABNORMAL HIGH (ref 12–28)
BUN: 18 mg/dL (ref 8–27)
Bilirubin Total: 0.4 mg/dL (ref 0.0–1.2)
CO2: 20 mmol/L (ref 20–29)
Calcium: 8.8 mg/dL (ref 8.7–10.3)
Chloride: 105 mmol/L (ref 96–106)
Creatinine, Ser: 0.6 mg/dL (ref 0.57–1.00)
GFR calc Af Amer: 108 mL/min/{1.73_m2} (ref >59)
GFR calc non Af Amer: 93 mL/min/{1.73_m2} (ref >59)
Globulin, Total: 3.5 g/dL (ref 1.5–4.5)
Glucose: 89 mg/dL (ref 65–99)
Potassium: 4.4 mmol/L (ref 3.5–5.2)
Sodium: 137 mmol/L (ref 134–144)
Total Protein: 7.5 g/dL (ref 6.0–8.5)

## 2020-04-27 MED FILL — LOSARTAN POTASSIUM 50 MG TA: 50 | 30 days supply | Qty: 30 | Fill #4

## 2020-05-22 MED FILL — LOSARTAN POTASSIUM 50 MG TA: 50 | 30 days supply | Qty: 30 | Fill #5

## 2020-06-26 MED FILL — LOSARTAN POTASSIUM 50 MG TA: 50 | 30 days supply | Qty: 30 | Fill #0

## 2020-07-03 ENCOUNTER — Other Ambulatory Visit: Payer: Self-pay | Admitting: Nurse Practitioner

## 2020-07-03 ENCOUNTER — Ambulatory Visit: Payer: Self-pay | Attending: Nurse Practitioner | Admitting: Nurse Practitioner

## 2020-07-03 NOTE — Progress Notes (Signed)
Error

## 2020-07-07 ENCOUNTER — Other Ambulatory Visit: Payer: Self-pay | Admitting: Nurse Practitioner

## 2020-07-07 ENCOUNTER — Encounter: Payer: Self-pay | Admitting: Nurse Practitioner

## 2020-07-07 ENCOUNTER — Ambulatory Visit: Payer: Self-pay | Attending: Nurse Practitioner | Admitting: Nurse Practitioner

## 2020-07-07 ENCOUNTER — Other Ambulatory Visit: Payer: Self-pay

## 2020-07-07 VITALS — BP 178/84 | HR 72 | Temp 97.7°F | Ht 62.0 in | Wt 154.0 lb

## 2020-07-07 DIAGNOSIS — R102 Pelvic and perineal pain: Secondary | ICD-10-CM

## 2020-07-07 DIAGNOSIS — I10 Essential (primary) hypertension: Secondary | ICD-10-CM

## 2020-07-07 MED ORDER — LOSARTAN POTASSIUM 100 MG PO TABS: 100.0000 mg | ORAL_TABLET | Freq: Every day | ORAL | 1 refills | Status: DC

## 2020-07-07 NOTE — Progress Notes (Signed)
Assessment & Plan:  Maria Dominguez was seen today for red lump inside vaginal.  Diagnoses and all orders for this visit:  Pelvic pressure in female -     US PELVIC COMPLETE WITH TRANSVAGINAL; Future -     Ambulatory referral to Gynecology  Essential hypertension -     losartan (COZAAR) 100 MG tablet; Take 1 tablet (100 mg total) by mouth daily. Continue all antihypertensives as prescribed.  Remember to bring in your blood pressure log with you for your follow up appointment.  DASH/Mediterranean Diets are healthier choices for HTN.    Patient has been counseled on age-appropriate routine health concerns for screening and prevention. These are reviewed and up-to-date. Referrals have been placed accordingly. Immunizations are up-to-date or declined.    Subjective:   Chief Complaint  Patient presents with   red lump inside vaginal    Pt. stated she have a red lump inside her vaginal.    HPI Maria Dominguez 69 y.o. female presents to office today for GU exam due to complaints of feeling a ball in her vagina when she urinates. VRI was used to communicate directly with patient for the entire encounter including providing detailed patient instructions.    GU PROBLEM Ongoing for several months. Feels pressure in lower pelvis and has noticed a small ball in her vagina when she sits to urinate. There is no pain associated with this. Denies hematuria or abnormal vaginal bleeding. She is post menopausal.   Essential Hypertension Poorly controlled. She endorses similar readings at home. Will increase losartan to 100 mg from 50 mg daily. Denies chest pain, shortness of breath, palpitations, lightheadedness, dizziness, headaches or BLE edema.  BP Readings from Last 3 Encounters:  07/07/20 (!) 178/84  03/31/20 133/83  02/03/20 (!) 152/57     Review of Systems  Constitutional: Negative for fever, malaise/fatigue and weight loss.  HENT: Negative.  Negative for nosebleeds.   Eyes: Negative.   Negative for blurred vision, double vision and photophobia.  Respiratory: Negative.  Negative for cough and shortness of breath.   Cardiovascular: Negative.  Negative for chest pain, palpitations and leg swelling.  Gastrointestinal: Negative.  Negative for heartburn, nausea and vomiting.  Genitourinary:       SEE HPI  Musculoskeletal: Negative.  Negative for myalgias.  Neurological: Negative.  Negative for dizziness, focal weakness, seizures and headaches.  Psychiatric/Behavioral: Negative.  Negative for suicidal ideas.    Past Medical History:  Diagnosis Date   Diverticulitis of colon with perforation    Fatty liver    Hx of breast cancer    Hyperlipidemia    Hypertension    Perforated gastric ulcer ([place])     Past Surgical History:  Procedure Laterality Date   HUMERUS FRACTURE SURGERY     left lumpectomy     MASS EXCISION Left    left axilla    Family History  Problem Relation Age of Onset   Diabetes Mother    Breast cancer Sister    Pancreatic cancer Cousin    Pancreatic cancer Cousin    Colon cancer Neg Hx    Esophageal cancer Neg Hx    Rectal cancer Neg Hx    Stomach cancer Neg Hx     Social History Reviewed with no changes to be made today.   Outpatient Medications Prior to Visit  Medication Sig Dispense Refill   acetaminophen (TYLENOL) 500 MG tablet Take 500 mg by mouth every 6 (six) hours as needed for mild pain.  cholecalciferol (VITAMIN D3) 25 MCG (1000 UNIT) tablet Take 1,000 Units by mouth daily.     omeprazole (PRILOSEC OTC) 20 MG tablet Take 20 mg by mouth daily.     rosuvastatin (CRESTOR) 10 MG tablet Take 1 tablet (10 mg total) by mouth daily. 90 tablet 1   traZODone (DESYREL) 50 MG tablet Take 0.5-1 tablets (25-50 mg total) by mouth at bedtime as needed for sleep. 30 tablet 3   vitamin B-12 (CYANOCOBALAMIN) 1000 MCG tablet Take 1,000 mcg by mouth daily.     vitamin E (VITAMIN E) 180 MG (400 UNITS) capsule Take 400 Units by  mouth daily.     losartan (COZAAR) 50 MG tablet Take 1 tablet (50 mg total) by mouth daily. 90 tablet 1   Facility-Administered Medications Prior to Visit  Medication Dose Route Frequency Provider Last Rate Last Admin   bupivacaine liposome (EXPAREL) 1.3 % injection 266 mg  20 mL Infiltration Once Stark Klein, MD        No Known Allergies     Objective:    BP (!) 178/84 (BP Location: Left Arm, Patient Position: Sitting, Cuff Size: Normal)    Pulse 72    Temp 97.7 F (36.5 C) (Temporal)    Ht 5\' 2"  (1.575 m)    Wt 154 lb (69.9 kg)    SpO2 98%    BMI 28.17 kg/m  Wt Readings from Last 3 Encounters:  07/07/20 154 lb (69.9 kg)  03/31/20 140 lb (63.5 kg)  02/03/20 146 lb (66.2 kg)    Physical Exam Exam conducted with a chaperone present.  Constitutional:      Appearance: She is well-developed.  HENT:     Head: Normocephalic.  Cardiovascular:     Rate and Rhythm: Normal rate and regular rhythm.     Heart sounds: Normal heart sounds.  Pulmonary:     Effort: Pulmonary effort is normal.     Breath sounds: Normal breath sounds.  Abdominal:     General: Bowel sounds are normal.     Palpations: Abdomen is soft.     Hernia: There is no hernia in the left inguinal area.  Genitourinary:    Exam position: Lithotomy position.     Labia:        Right: No rash, tenderness, lesion or injury.        Left: No rash, tenderness, lesion or injury.      Vagina: Normal. No signs of injury and foreign body. No vaginal discharge, erythema, tenderness or bleeding.     Cervix: No cervical motion tenderness or friability.     Uterus: With uterine prolapse. Not deviated and not enlarged.      Adnexa:        Right: No mass, tenderness or fullness.         Left: No mass, tenderness or fullness.       Rectum: Normal. No external hemorrhoid.  Lymphadenopathy:     Lower Body: No right inguinal adenopathy. No left inguinal adenopathy.  Skin:    General: Skin is warm and dry.  Neurological:      Mental Status: She is alert and oriented to person, place, and time.  Psychiatric:        Behavior: Behavior normal.        Thought Content: Thought content normal.        Judgment: Judgment normal.          Patient has been counseled extensively about nutrition and exercise as well as  the importance of adherence with medications and regular follow-up. The patient was given clear instructions to go to ER or return to medical center if symptoms don't improve, worsen or new problems develop. The patient verbalized understanding.   Follow-up: Return in about 3 weeks (around 07/28/2020) for BP CHECK WITH LUKE and BMP.   [name], FNP-BC [facility] and [facility] [place], [place]   07/08/2020, 9:23 AM

## 2020-07-09 ENCOUNTER — Encounter: Payer: Self-pay | Admitting: Nurse Practitioner

## 2020-07-23 MED FILL — LOSARTAN POTASSIUM 100 MG T: 100 | 30 days supply | Qty: 30 | Fill #0

## 2020-08-10 ENCOUNTER — Other Ambulatory Visit (HOSPITAL_COMMUNITY)
Admission: RE | Admit: 2020-08-10 | Discharge: 2020-08-10 | Disposition: A | Discharge status: Home / Self Care | Payer: Self-pay | Source: Ambulatory Visit | Attending: Obstetrics and Gynecology | Admitting: Obstetrics and Gynecology

## 2020-08-10 ENCOUNTER — Other Ambulatory Visit: Payer: Self-pay

## 2020-08-10 ENCOUNTER — Encounter: Payer: Self-pay | Admitting: Obstetrics and Gynecology

## 2020-08-10 ENCOUNTER — Ambulatory Visit (INDEPENDENT_AMBULATORY_CARE_PROVIDER_SITE_OTHER): Payer: Self-pay | Admitting: Obstetrics and Gynecology

## 2020-08-10 VITALS — BP 174/78 | HR 81 | Wt 157.1 lb

## 2020-08-10 DIAGNOSIS — N898 Other specified noninflammatory disorders of vagina: Secondary | ICD-10-CM | POA: Insufficient documentation

## 2020-08-10 DIAGNOSIS — N8189 Other female genital prolapse: Secondary | ICD-10-CM | POA: Insufficient documentation

## 2020-08-10 MED FILL — traZODone HCL 50 MG TABS: 50 | 30 days supply | Qty: 30 | Fill #1

## 2020-08-10 MED FILL — ROSUVASTATIN CALCIUM 10 MG: 10 | 90 days supply | Qty: 90 | Fill #1

## 2020-08-10 NOTE — Addendum Note (Signed)
Addended by: Kandyce Rud on: 08/10/2020 02:38 PM   Modules accepted: Orders

## 2020-08-10 NOTE — Patient Instructions (Signed)

## 2020-08-10 NOTE — Progress Notes (Signed)
Ms Batte is being seen today in referral from IM for possible pelvic relaxation. Pt reports feeling a "bulge" and some vaginal pressure for several months now. She denies any urinary incont Sx but does wear a pad just in case She does have some constipation at times but reports this is no problem She is PM for several years now She is not sexual active H/O TSVD x 2. Unable to recall wts.  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 Will refer to Urogyn for further eval and Tx options. Video interrupter used during today's visit

## 2020-08-10 NOTE — Progress Notes (Signed)
Video Interpreter # [place] [phone]

## 2020-08-11 ENCOUNTER — Ambulatory Visit
Admission: RE | Admit: 2020-08-11 | Discharge: 2020-08-11 | Disposition: A | Discharge status: Home / Self Care | Payer: Self-pay | Source: Ambulatory Visit | Attending: Nurse Practitioner | Admitting: Nurse Practitioner

## 2020-08-11 DIAGNOSIS — R102 Pelvic and perineal pain: Secondary | ICD-10-CM | POA: Insufficient documentation

## 2020-08-11 LAB — CERVICOVAGINAL ANCILLARY ONLY
Bacterial Vaginitis (gardnerella): NEGATIVE
Candida Glabrata: NEGATIVE
Candida Vaginitis: NEGATIVE
Comment: NEGATIVE
Comment: NEGATIVE
Comment: NEGATIVE

## 2020-08-12 ENCOUNTER — Telehealth: Payer: Self-pay | Admitting: *Deleted

## 2020-08-12 ENCOUNTER — Other Ambulatory Visit: Payer: Self-pay | Admitting: Nurse Practitioner

## 2020-08-12 DIAGNOSIS — N83201 Unspecified ovarian cyst, right side: Secondary | ICD-10-CM

## 2020-08-12 DIAGNOSIS — R102 Pelvic and perineal pain: Secondary | ICD-10-CM

## 2020-08-12 NOTE — Telephone Encounter (Signed)
I called Maria Dominguez with Interpreter [place] and a female answered who said she is her daughter and [name] not available. I asked if she can give her a message we will call later with non urgent information - she said to call after 3 please. Cherrill Scrima,RN

## 2020-08-12 NOTE — Telephone Encounter (Signed)
I called Annitta with interpreter [place] [place] and notified her per Dr. Rip Harbour vaginal swab was negative for infection. She voiced understanding and appreciated our call. Jeffry Vogelsang,RN

## 2020-08-12 NOTE — Telephone Encounter (Signed)
-----   Message from Chancy Milroy, MD sent at 08/12/2020 10:28 AM EDT ----- Please let Ms Bold know that her vaginal swab was negative for infection. Thanks Legrand Como

## 2020-08-17 NOTE — Progress Notes (Signed)
Pt. Already seen the gynecologist.

## 2020-08-28 MED FILL — LOSARTAN POTASSIUM 100 MG T: 100 | 30 days supply | Qty: 30 | Fill #1

## 2020-09-07 NOTE — Progress Notes (Deleted)
[facility] New Patient Evaluation and Consultation  Referring Provider: Chancy Milroy, MD PCP: [name], NP Date of Service: 09/09/2020  SUBJECTIVE Chief Complaint: No chief complaint on file.  History of Present Illness: Maria Dominguez is a 69 y.o. Hispanic female seen in consultation at the request of Dr Rip Harbour for evaluation of prolapse.    Review of records significant for: Feeling of bulge in the vagina with urination. Noted cystocele and rectocele on exam.   Urinary Symptoms: {urine leakage?:24754} Leaks *** time(s) per {days/wks/mos/yrs:310907}.  Pad use: {NUMBERS 1-10:18281} {pad option:24752} per day.   She {ACTION; IS/IS QBH:41937902} bothered by her UI symptoms.  Day time voids ***.  Nocturia: *** times per night to void. Voiding dysfunction: she {empties:24755} her bladder well.  {DOES NOT does:27190::"does not"} use a catheter to empty bladder.  When urinating, she feels {urine symptoms:24756} Drinks: *** per day  UTIs: {NUMBERS 1-10:18281} UTI's in the last year.   {ACTIONS;DENIES/REPORTS:21021675::"Denies"} history of {urologic concerns:24757}  Pelvic Organ Prolapse Symptoms:                  She {denies/ admits to:24761} a feeling of a bulge the vaginal area. It has been present for {NUMBER 1-10:22536} {days/wks/mos/yrs:310907}.  She {denies/ admits to:24761} seeing a bulge.  This bulge {ACTION; IS/IS IOX:73532992} bothersome.  Bowel Symptom: Bowel movements: *** time(s) per {Time; day/week/month:13537} Stool consistency: {stool consistency:24758} Straining: {yes/no:19897}.  Splinting: {yes/no:19897}.  Incomplete evacuation: {yes/no:19897}.  She {denies/ admits to:24761} accidental bowel leakage / fecal incontinence  Occurs: *** time(s) per {Time; day/week/month:13537}  Consistency with leakage: {stool consistency:24758} Bowel regimen: {bowel regimen:24759} Last colonoscopy: Date ***, Results ***  Sexual Function Sexually active:  {yes/no:19897}.  Sexual orientation: {Sexual [phone]} Pain with sex: {pain with sex:24762}  Pelvic Pain {denies/ admits to:24761} pelvic pain Location: *** Pain occurs: *** Prior pain treatment: *** Improved by: *** Worsened by: ***   Past Medical History:  Past Medical History:  Diagnosis Date  . Diverticulitis of colon with perforation   . Fatty liver   . Hx of breast cancer   . Hyperlipidemia   . Hypertension   . Perforated gastric ulcer ([place])      Past Surgical History:   Past Surgical History:  Procedure Laterality Date  . HUMERUS FRACTURE SURGERY    . left lumpectomy    . MASS EXCISION Left    left axilla     Past OB/GYN History: G{NUMBERS 1-10:18281} P{NUMBERS 1-10:18281} Vaginal deliveries: ***,  Forceps/ Vacuum deliveries: ***, Cesarean section: *** Menopausal: {menopausal:24763} Contraception: ***. Last pap smear was ***.  Any history of abnormal pap smears: {yes/no:19897}.   Medications: She has a current medication list which includes the following prescription(s): acetaminophen, cholecalciferol, losartan, omeprazole, rosuvastatin, trazodone, vitamin b-12, and vitamin e, and the following Facility-Administered Medications: bupivacaine liposome.   Allergies: Patient has No Known Allergies.   Social History:  Social History   Tobacco Use  . Smoking status: Former Smoker    Types: Cigarettes  . Smokeless tobacco: Never Used  Vaping Use  . Vaping Use: Never used  Substance Use Topics  . Alcohol use: Not Currently  . Drug use: No    Relationship status: {relationship status:24764} She lives with ***.   She {ACTION; IS/IS EQA:83419622} employed ***. Regular exercise: {Yes/No:304960894} History of abuse: {Yes/No:304960894}  Family History:   Family History  Problem Relation Age of Onset  . Diabetes Mother   . Breast cancer Sister   . Pancreatic cancer Cousin   .  Pancreatic cancer Cousin   . Colon cancer Neg Hx   .  Esophageal cancer Neg Hx   . Rectal cancer Neg Hx   . Stomach cancer Neg Hx      Review of Systems: ROS   OBJECTIVE Physical Exam: There were no vitals filed for this visit.  Physical Exam   GU / Detailed Urogynecologic Evaluation:  Pelvic Exam: Normal external female genitalia; Bartholin's and Skene's glands normal in appearance; urethral meatus normal in appearance, no urethral masses or discharge.   CST: {gen negative/positive:315881}  Reflexes: bulbocavernosis {DESC; PRESENT/NOT PRESENT:21021351}, anocutaneous {DESC; PRESENT/NOT PRESENT:21021351} ***bilaterally.  Speculum exam reveals normal vaginal mucosa {With/Without:20273} atrophy. Cervix {exam; gyn cervix:30847}. Uterus {exam; pelvic uterus:30849}. Adnexa {exam; adnexa:12223}.    s/p hysterectomy: Speculum exam reveals normal vaginal mucosa {With/Without:20273}  atrophy and normal vaginal cuff.  Adnexa {exam; adnexa:12223}.    With apex supported, anterior compartment defect was {reduced:24765}  Pelvic floor strength {Roman # I-V:19040}/V, puborectalis {Roman # I-V:19040}/V external anal sphincter {Roman # I-V:19040}/V  Pelvic floor musculature: Right levator {Tender/Non-tender:20250}, Right obturator {Tender/Non-tender:20250}, Left levator {Tender/Non-tender:20250}, Left obturator {Tender/Non-tender:20250}  POP-Q:   POP-Q                                               Aa                                               Ba                                                 C                                                Gh                                               Pb                                               tvl                                                Ap                                               Bp  D     Rectal Exam:  Normal sphincter tone, {rectocele:24766} distal rectocele, enterocoele {DESC; PRESENT/NOT PRESENT:21021351}, no rectal  masses, {sign of:24767} dyssynergia when asking the patient to bear down.  Post-Void Residual (PVR) by Bladder Scan: In order to evaluate bladder emptying, we discussed obtaining a postvoid residual and she agreed to this procedure.  Procedure: The ultrasound unit was placed on the patient's abdomen in the suprapubic region after the patient had voided. A PVR of *** ml was obtained by bladder scan.  Laboratory Results: @ENCLABS @   ***I visualized the urine specimen, noting the specimen to be {urine color:24768}  ASSESSMENT AND PLAN Ms. Cassells is a 69 y.o. with: No diagnosis found.    Jaquita Folds, MD   Medical Decision Making:  - Reviewed/ ordered a clinical laboratory test - Reviewed/ ordered a radiologic study - Reviewed/ ordered medicine test - Decision to obtain old records - Discussion of management of or test interpretation with an external physician / other healthcare professional  - Assessment requiring independent historian - Review and summation of prior records - Independent review of image, tracing or specimen

## 2020-09-09 ENCOUNTER — Ambulatory Visit: Payer: Self-pay | Admitting: Obstetrics and Gynecology

## 2020-09-11 NOTE — Progress Notes (Deleted)
[facility] New Patient Evaluation and Consultation  Referring Provider: Chancy Milroy, MD PCP: [name], NP Date of Service: 09/17/2020  SUBJECTIVE Chief Complaint: No chief complaint on file.  History of Present Illness: Maria Dominguez is a 69 y.o. Hispanic female seen in consultation at the request of Dr. Rip Harbour for evaluation of prolapse.    Review of records significant for: Vaginal bulge and pressure. Cystocele past hymenal ring noted on exam.   Urinary Symptoms: {urine leakage?:24754} Leaks *** time(s) per {days/wks/mos/yrs:310907}.  Pad use: {NUMBERS 1-10:18281} {pad option:24752} per day.   She {ACTION; IS/IS QQV:95638756} bothered by her UI symptoms.  Day time voids ***.  Nocturia: *** times per night to void. Voiding dysfunction: she {empties:24755} her bladder well.  {DOES NOT does:27190::"does not"} use a catheter to empty bladder.  When urinating, she feels {urine symptoms:24756} Drinks: *** per day  UTIs: {NUMBERS 1-10:18281} UTI's in the last year.   {ACTIONS;DENIES/REPORTS:21021675::"Denies"} history of {urologic concerns:24757}  Pelvic Organ Prolapse Symptoms:                  She {denies/ admits to:24761} a feeling of a bulge the vaginal area. It has been present for {NUMBER 1-10:22536} {days/wks/mos/yrs:310907}.  She {denies/ admits to:24761} seeing a bulge.  This bulge {ACTION; IS/IS EPP:29518841} bothersome.  Bowel Symptom: Bowel movements: *** time(s) per {Time; day/week/month:13537} Stool consistency: {stool consistency:24758} Straining: {yes/no:19897}.  Splinting: {yes/no:19897}.  Incomplete evacuation: {yes/no:19897}.  She {denies/ admits to:24761} accidental bowel leakage / fecal incontinence  Occurs: *** time(s) per {Time; day/week/month:13537}  Consistency with leakage: {stool consistency:24758} Bowel regimen: {bowel regimen:24759} Last colonoscopy: Date ***, Results ***  Sexual Function Sexually active: {yes/no:19897}.   Sexual orientation: {Sexual Orientation:[phone]} Pain with sex: {pain with sex:24762}  Pelvic Pain {denies/ admits to:24761} pelvic pain Location: *** Pain occurs: *** Prior pain treatment: *** Improved by: *** Worsened by: ***   Past Medical History:  Past Medical History:  Diagnosis Date  . Diverticulitis of colon with perforation   . Fatty liver   . Hx of breast cancer   . Hyperlipidemia   . Hypertension   . Perforated gastric ulcer ([place])      Past Surgical History:   Past Surgical History:  Procedure Laterality Date  . HUMERUS FRACTURE SURGERY    . left lumpectomy    . MASS EXCISION Left    left axilla     Past OB/GYN History: G{NUMBERS 1-10:18281} P{NUMBERS 1-10:18281} Vaginal deliveries: ***,  Forceps/ Vacuum deliveries: ***, Cesarean section: *** Menopausal: {menopausal:24763} Contraception: ***. Last pap smear was ***.  Any history of abnormal pap smears: {yes/no:19897}.   Medications: She has a current medication list which includes the following prescription(s): acetaminophen, cholecalciferol, losartan, omeprazole, rosuvastatin, trazodone, vitamin b-12, and vitamin e, and the following Facility-Administered Medications: bupivacaine liposome.   Allergies: Patient has No Known Allergies.   Social History:  Social History   Tobacco Use  . Smoking status: Former Smoker    Types: Cigarettes  . Smokeless tobacco: Never Used  Vaping Use  . Vaping Use: Never used  Substance Use Topics  . Alcohol use: Not Currently  . Drug use: No    Relationship status: {relationship status:24764} She lives with ***.   She {ACTION; IS/IS YSA:63016010} employed ***. Regular exercise: {Yes/No:304960894} History of abuse: {Yes/No:304960894}  Family History:   Family History  Problem Relation Age of Onset  . Diabetes Mother   . Breast cancer Sister   . Pancreatic cancer Cousin   . Pancreatic cancer Cousin   .  Colon cancer Neg Hx   . Esophageal cancer Neg Hx    . Rectal cancer Neg Hx   . Stomach cancer Neg Hx      Review of Systems: ROS   OBJECTIVE Physical Exam: There were no vitals filed for this visit.  Physical Exam   GU / Detailed Urogynecologic Evaluation:  Pelvic Exam: Normal external female genitalia; Bartholin's and Skene's glands normal in appearance; urethral meatus normal in appearance, no urethral masses or discharge.   CST: {gen negative/positive:315881}  Reflexes: bulbocavernosis {DESC; PRESENT/NOT PRESENT:21021351}, anocutaneous {DESC; PRESENT/NOT PRESENT:21021351} ***bilaterally.  Speculum exam reveals normal vaginal mucosa {With/Without:20273} atrophy. Cervix {exam; gyn cervix:30847}. Uterus {exam; pelvic uterus:30849}. Adnexa {exam; adnexa:12223}.    s/p hysterectomy: Speculum exam reveals normal vaginal mucosa {With/Without:20273}  atrophy and normal vaginal cuff.  Adnexa {exam; adnexa:12223}.    With apex supported, anterior compartment defect was {reduced:24765}  Pelvic floor strength {Roman # I-V:19040}/V, puborectalis {Roman # I-V:19040}/V external anal sphincter {Roman # I-V:19040}/V  Pelvic floor musculature: Right levator {Tender/Non-tender:20250}, Right obturator {Tender/Non-tender:20250}, Left levator {Tender/Non-tender:20250}, Left obturator {Tender/Non-tender:20250}  POP-Q:   POP-Q                                               Aa                                               Ba                                                 C                                                Gh                                               Pb                                               tvl                                                Ap                                               Bp  D     Rectal Exam:  Normal sphincter tone, {rectocele:24766} distal rectocele, enterocoele {DESC; PRESENT/NOT PRESENT:21021351}, no rectal masses, {sign of:24767}  dyssynergia when asking the patient to bear down.  Post-Void Residual (PVR) by Bladder Scan: In order to evaluate bladder emptying, we discussed obtaining a postvoid residual and she agreed to this procedure.  Procedure: The ultrasound unit was placed on the patient's abdomen in the suprapubic region after the patient had voided. A PVR of *** ml was obtained by bladder scan.  Laboratory Results: @ENCLABS @   ***I visualized the urine specimen, noting the specimen to be {urine color:24768}  ASSESSMENT AND PLAN Ms. Nanni is a 69 y.o. with: No diagnosis found.    Jaquita Folds, MD   Medical Decision Making:  - Reviewed/ ordered a clinical laboratory test - Reviewed/ ordered a radiologic study - Reviewed/ ordered medicine test - Decision to obtain old records - Discussion of management of or test interpretation with an external physician / other healthcare professional  - Assessment requiring independent historian - Review and summation of prior records - Independent review of image, tracing or specimen

## 2020-09-14 ENCOUNTER — Other Ambulatory Visit: Payer: Self-pay

## 2020-09-14 ENCOUNTER — Other Ambulatory Visit: Payer: Self-pay | Admitting: Physician Assistant

## 2020-09-14 ENCOUNTER — Ambulatory Visit: Payer: Self-pay | Attending: Physician Assistant | Admitting: Physician Assistant

## 2020-09-14 VITALS — BP 135/85 | HR 97 | Temp 98.7°F | Resp 18 | Ht 59.0 in | Wt 159.0 lb

## 2020-09-14 DIAGNOSIS — J060 Acute laryngopharyngitis: Secondary | ICD-10-CM

## 2020-09-14 MED ORDER — AZITHROMYCIN 250 MG PO TABS: ORAL_TABLET | ORAL | 0 refills | Status: DC

## 2020-09-14 MED ORDER — BENZONATATE 100 MG PO CAPS: 100.0000 mg | ORAL_CAPSULE | Freq: Two times a day (BID) | ORAL | 0 refills | Status: DC | PRN

## 2020-09-14 MED FILL — AZITHROMYCIN 250 MG TABLET: 250 | 5 days supply | Qty: 6 | Fill #0

## 2020-09-14 MED FILL — BENZONATATE 100 MG CAPS: 100 | 10 days supply | Qty: 20 | Fill #0

## 2020-09-14 NOTE — Progress Notes (Signed)
Patient verified DOB Patient has eaten today and taken medication today. Patient denies pain at this time Patient complains of dry throat and hoarseness. Patient has a green productive

## 2020-09-14 NOTE — Progress Notes (Signed)
Established Patient Office Visit  Subjective:  Patient ID: Maria Dominguez, female    DOB: 25-Aug-1951  Age: 69 y.o. MRN: [id number]  CC:  Chief Complaint  Patient presents with  . Hoarse    HPI Maria Dominguez reports that she has been having a productive cough with green sputum, dry throat,weakness, wheezing, hoarseness and intermittent tachycardia for the past 15 days .   States that daughter had a cold prior to her feeling poorly.  States that she has taken some abx that daughter had from her country and OTC day and night cold medication.  States the antibiotic was amoxicillin 500 mg, states that she took it every 8 hours, took 6 pills total.   Eating and drinking okay  Has had vaccine against Covid - 19 ; had Gideon ; last dose June 25th was second dose   Daughter is present and helps with history    Due to language barrier, an interpreter was present during the history-taking and subsequent discussion (and for part of the physical exam) with this patient.   Past Medical History:  Diagnosis Date  . Diverticulitis of colon with perforation   . Fatty liver   . Hx of breast cancer   . Hyperlipidemia   . Hypertension   . Perforated gastric ulcer ([place])     Past Surgical History:  Procedure Laterality Date  . HUMERUS FRACTURE SURGERY    . left lumpectomy    . MASS EXCISION Left    left axilla    Family History  Problem Relation Age of Onset  . Diabetes Mother   . Breast cancer Sister   . Pancreatic cancer Cousin   . Pancreatic cancer Cousin   . Colon cancer Neg Hx   . Esophageal cancer Neg Hx   . Rectal cancer Neg Hx   . Stomach cancer Neg Hx     Social History   Socioeconomic History  . Marital status: Single    Spouse name: Not on file  . Number of children: 2  . Years of education: Not on file  . Highest education level: 10th grade  Occupational History  . Not on file  Tobacco Use  . Smoking status: Former Smoker    Types: Cigarettes  . Smokeless  tobacco: Never Used  Vaping Use  . Vaping Use: Never used  Substance and Sexual Activity  . Alcohol use: Not Currently  . Drug use: No  . Sexual activity: Not on file  Other Topics Concern  . Not on file  Social History Narrative  . Not on file   Social Determinants of Health   Financial Resource Strain:   . Difficulty of Paying Living Expenses: Not on file  Food Insecurity:   . Worried About Charity fundraiser in the Last Year: Not on file  . Ran Out of Food in the Last Year: Not on file  Transportation Needs:   . Lack of Transportation (Medical): Not on file  . Lack of Transportation (Non-Medical): Not on file  Physical Activity:   . Days of Exercise per Week: Not on file  . Minutes of Exercise per Session: Not on file  Stress:   . Feeling of Stress : Not on file  Social Connections:   . Frequency of Communication with Friends and Family: Not on file  . Frequency of Social Gatherings with Friends and Family: Not on file  . Attends Religious Services: Not on file  . Active Member of Clubs or Organizations: Not  on file  . Attends Archivist Meetings: Not on file  . Marital Status: Not on file  Intimate Partner Violence:   . Fear of Current or Ex-Partner: Not on file  . Emotionally Abused: Not on file  . Physically Abused: Not on file  . Sexually Abused: Not on file    Outpatient Medications Prior to Visit  Medication Sig Dispense Refill  . acetaminophen (TYLENOL) 500 MG tablet Take 500 mg by mouth every 6 (six) hours as needed for mild pain.    . cholecalciferol (VITAMIN D3) 25 MCG (1000 UNIT) tablet Take 1,000 Units by mouth daily.    Marland Kitchen losartan (COZAAR) 100 MG tablet Take 1 tablet (100 mg total) by mouth daily. 30 tablet 1  . omeprazole (PRILOSEC OTC) 20 MG tablet Take 20 mg by mouth daily.    . rosuvastatin (CRESTOR) 10 MG tablet Take 1 tablet (10 mg total) by mouth daily. 90 tablet 1  . traZODone (DESYREL) 50 MG tablet Take 0.5-1 tablets (25-50 mg total)  by mouth at bedtime as needed for sleep. 30 tablet 3  . vitamin B-12 (CYANOCOBALAMIN) 1000 MCG tablet Take 1,000 mcg by mouth daily.    . vitamin E (VITAMIN E) 180 MG (400 UNITS) capsule Take 400 Units by mouth daily.     Facility-Administered Medications Prior to Visit  Medication Dose Route Frequency Provider Last Rate Last Admin  . bupivacaine liposome (EXPAREL) 1.3 % injection 266 mg  20 mL Infiltration Once Stark Klein, MD        No Known Allergies  ROS Review of Systems  Constitutional: Negative for chills, fatigue and fever.  HENT: Positive for congestion. Negative for ear discharge, ear pain, sinus pressure, sinus pain and sore throat.   Eyes: Negative.   Respiratory: Positive for cough. Negative for shortness of breath and wheezing.   Cardiovascular: Positive for palpitations. Negative for chest pain.  Gastrointestinal: Negative for nausea and vomiting.  Endocrine: Negative.   Genitourinary: Negative.   Allergic/Immunologic: Negative.   Neurological: Negative for dizziness, weakness and headaches.  Hematological: Negative.   Psychiatric/Behavioral: Negative.       Objective:    Physical Exam Vitals and nursing note reviewed.  Constitutional:      General: She is not in acute distress.    Appearance: Normal appearance. She is not ill-appearing.  HENT:     Head: Normocephalic and atraumatic.     Right Ear: Tympanic membrane, ear canal and external ear normal.     Left Ear: Tympanic membrane, ear canal and external ear normal.     Nose: Congestion present.     Right Turbinates: Enlarged.     Left Turbinates: Enlarged.     Right Sinus: No frontal sinus tenderness.     Left Sinus: No frontal sinus tenderness.     Mouth/Throat:     Mouth: Mucous membranes are moist.     Pharynx: Oropharynx is clear. No pharyngeal swelling or posterior oropharyngeal erythema.  Eyes:     Extraocular Movements: Extraocular movements intact.     Conjunctiva/sclera: Conjunctivae  normal.     Pupils: Pupils are equal, round, and reactive to light.  Cardiovascular:     Rate and Rhythm: Normal rate and regular rhythm.     Pulses: Normal pulses.     Heart sounds: Normal heart sounds.  Pulmonary:     Effort: Pulmonary effort is normal.     Breath sounds: Normal breath sounds. No wheezing.  Abdominal:     General:  Abdomen is flat.     Palpations: Abdomen is soft.  Musculoskeletal:        General: Normal range of motion.     Cervical back: Normal range of motion and neck supple.  Lymphadenopathy:     Cervical: Cervical adenopathy present.  Skin:    General: Skin is warm and dry.  Neurological:     General: No focal deficit present.     Mental Status: She is alert and oriented to person, place, and time.  Psychiatric:        Mood and Affect: Mood normal.        Behavior: Behavior normal.        Thought Content: Thought content normal.        Judgment: Judgment normal.     Ht 4\' 11"  (1.499 m)   Wt 159 lb (72.1 kg)   BMI 32.11 kg/m  Wt Readings from Last 3 Encounters:  09/14/20 159 lb (72.1 kg)  08/10/20 157 lb 1.6 oz (71.3 kg)  07/07/20 154 lb (69.9 kg)     Health Maintenance Due  Topic Date Due  . COVID-19 Vaccine (1) Never done  . TETANUS/TDAP  Never done  . MAMMOGRAM  Never done  . DEXA SCAN  Never done  . PNA vac Low Risk Adult (1 of 2 - PCV13) Never done    There are no preventive care reminders to display for this patient.  No results found for: TSH Lab Results  Component Value Date   WBC 13.8 (H) 02/05/2020   HGB 9.8 (L) 02/05/2020   HCT 30.6 (L) 02/05/2020   MCV 80.8 02/05/2020   PLT 369.0 02/05/2020   Lab Results  Component Value Date   NA 137 03/31/2020   K 4.4 03/31/2020   CO2 20 03/31/2020   GLUCOSE 89 03/31/2020   BUN 18 03/31/2020   CREATININE 0.60 03/31/2020   BILITOT 0.4 03/31/2020   ALKPHOS 134 (H) 03/31/2020   AST 16 03/31/2020   ALT 11 03/31/2020   PROT 7.5 03/31/2020   ALBUMIN 4.0 03/31/2020   CALCIUM 8.8  03/31/2020   ANIONGAP 8 11/29/2019   GFR 88.66 02/05/2020   Lab Results  Component Value Date   CHOL 92 (L) 12/30/2019   Lab Results  Component Value Date   HDL 42 12/30/2019   Lab Results  Component Value Date   LDLCALC 23 12/30/2019   Lab Results  Component Value Date   TRIG 162 (H) 12/30/2019   Lab Results  Component Value Date   CHOLHDL 2.2 12/30/2019   Lab Results  Component Value Date   HGBA1C 5.6 12/30/2019      Assessment & Plan:   Problem List Items Addressed This Visit    None    1. Acute laryngopharyngitis Patient requested work note stating that she was okay to go back to work and was not diagnosed with COVID-19.  Patient education given that I was unable to state that she was not infected with Covid since no testing had been completed.  Encourage patient to have testing completed prior to return to work.  Patient education given on increasing hydration, rest, proper over-the-counter medications to use with hypertension.  Patient education given on proper antibiotic use.  Trial azithromycin, Tessalon Perles.  Red flags given for prompt reevaluation - azithromycin (ZITHROMAX) 250 MG tablet; Take 2 tabs PO day 1, then take 1 tab PO once daily  Dispense: 6 tablet; Refill: 0 - benzonatate (TESSALON) 100 MG capsule; Take 1 capsule (  100 mg total) by mouth 2 (two) times daily as needed for cough.  Dispense: 20 capsule; Refill: 0   I have reviewed the patient's medical history (PMH, PSH, Social History, Family History, Medications, and allergies) , and have been updated if relevant. I spent 20 minutes reviewing chart and  face to face time with patient.    No orders of the defined types were placed in this encounter.   Follow-up: No follow-ups on file.    Loraine Grip Mayers, PA-C

## 2020-09-14 NOTE — Patient Instructions (Addendum)
You will take azithromycin to help resolve your illness.  I encourage you to use Tessalon Perles to help you with your cough, you can also use over-the-counter Vicks VapoRub, and cold medications that are proper for people being treated for hypertension.  I am unable to say that you were not diagnosed with Covid without you being tested for it.  I do encourage you to have testing completed prior to returning to work.  Continue to stay well-hydrated and get plenty of rest.  I hope that you feel better soon  Kennieth Rad, PA-C Physician Assistant [email or web address]  Chlorpheniramine; Dextromethorphan Oral Tablets What is this medicine? CHLORPHENIRAMINE; DEXTROMETHORPHAN (klor fen IR a meen; dex troe meth OR fan) is a combination of an antihistamine and a cough suppressant. It is used to treat the symptoms of a cold. This medicine will not treat an infection. This medicine may be used for other purposes; ask your health care provider or pharmacist if you have questions. COMMON BRAND NAME(S): Coricidin HBP Cough and Cold, Cough & Cold High Blood Pressure, Cough and Cold, Maxichlor DM What should I tell my health care provider before I take this medicine? They need to know if you have any of these conditions:  asthma  heart disease  glaucoma  kidney disease  liver disease  long lasting (chronic) cough  other health condition  prostate problems  taken an MAOI like Carbex, Eldepryl, Marplan, Nardil, or Parnate in last 14 days  an unusual or allergic reaction to chlorpheniramine, dextromethorphan, other medicines, foods, dyes, or preservatives  pregnant or trying to get pregnant  breast-feeding How should I use this medicine? Take this medicine by mouth with a glass of water. Follow the directions on the package label. Take your medicine at regular intervals. Do not take it more often than directed. Talk to your  pediatrician regarding the use of this medicine in children. While this drug may be prescribed for children as young as 41 years old for selected conditions, precautions do apply. Overdosage: If you think you have taken too much of this medicine contact a poison control center or emergency room at once. NOTE: This medicine is only for you. Do not share this medicine with others. What if I miss a dose? If you miss a dose, take it as soon as you can. If it is almost time for your next dose, take only that dose. Do not take double or extra doses. What may interact with this medicine? Do not take this medicine with any of the following medications:  MAOIs like Carbex, Eldepryl, Marplan, Nardil, and Parnate This medicine may also interact with the following medications:  alcohol  barbiturates, like phenobarbital  furazolidone  linezolid  medicines for sleep  other medicines for cold, cough or allergy  procarbazine  some herbal or nutritional supplements like St. John's Wort This list may not describe all possible interactions. Give your health care provider a list of all the medicines, herbs, non-prescription drugs, or dietary supplements you use. Also tell them if you smoke, drink alcohol, or use illegal drugs. Some items may interact with your medicine. What should I watch for while using this medicine? Tell your doctor or healthcare professional if your symptoms do not start to get better or if they get worse. See the doctor if the symptoms last more than 7 days or if a fever, rash, or long lasting headache is also present. Coughs that last more than 7 days or come back  can be a sign of a serious health condition. You may get drowsy or dizzy. Do not drive, use machinery, or do anything that needs mental alertness until you know how this medicine affects you. Do not stand or sit up quickly, especially if you are an older patient. This reduces the risk of dizzy or fainting spells. Alcohol  may interfere with the effect of this medicine. Avoid alcoholic drinks. What side effects may I notice from receiving this medicine? Side effects that you should report to your doctor or health care professional as soon as possible:  allergic reactions like skin rash, itching or hives, swelling of the face, lips, or tongue  breathing problems  changes in vision  confused, faint, lightheaded  fast, irregular heartbeat  seizures  trouble passing urine or change in the amount of urine  unusually weak or tired Side effects that usually do not require medical attention (report to your doctor or health care professional if they continue or are bothersome):  constipation  dry mouth, nose, throat  drowsy, sleepy  headache  stomach upset, nausea  difficulty sleeping This list may not describe all possible side effects. Call your doctor for medical advice about side effects. You may report side effects to FDA at 1-800-FDA-1088. Where should I keep my medicine? Keep out of the reach of children. Store at room temperature between 15 and 30 degrees C (59 and 86 degrees F). Throw away any unused medicine after the expiration date. NOTE: This sheet is a summary. It may not cover all possible information. If you have questions about this medicine, talk to your doctor, pharmacist, or health care provider.  2020 Elsevier/Gold Standard (2014-05-31 18:03:19)

## 2020-09-15 ENCOUNTER — Encounter: Payer: Self-pay | Admitting: Physician Assistant

## 2020-09-17 ENCOUNTER — Ambulatory Visit: Payer: Self-pay | Admitting: Obstetrics and Gynecology

## 2020-09-18 NOTE — Progress Notes (Deleted)
[facility] New Patient Evaluation and Consultation  Referring Provider: Chancy Milroy, MD PCP: [name], NP Date of Service: 09/24/2020  SUBJECTIVE Chief Complaint: No chief complaint on file.  History of Present Illness: Maria Dominguez is a 69 y.o. Hispanic female seen in consultation at the request of Dr. Rip Harbour for evaluation of prolapse.    Review of records from Dr Rip Harbour significant for: Feels ball in vagina, especially when she sits to urinate. 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   Urinary Symptoms: {urine leakage?:24754} Leaks *** time(s) per {days/wks/mos/yrs:310907}.  Pad use: {NUMBERS 1-10:18281} {pad option:24752} per day.   She {ACTION; IS/IS TDV:76160737} bothered by her UI symptoms.  Day time voids ***.  Nocturia: *** times per night to void. Voiding dysfunction: she {empties:24755} her bladder well.  {DOES NOT does:27190::"does not"} use a catheter to empty bladder.  When urinating, she feels {urine symptoms:24756} Drinks: *** per day  UTIs: {NUMBERS 1-10:18281} UTI's in the last year.   {ACTIONS;DENIES/REPORTS:21021675::"Denies"} history of {urologic concerns:24757}  Pelvic Organ Prolapse Symptoms:                  She {denies/ admits to:24761} a feeling of a bulge the vaginal area. It has been present for {NUMBER 1-10:22536} {days/wks/mos/yrs:310907}.  She {denies/ admits to:24761} seeing a bulge.  This bulge {ACTION; IS/IS TGG:26948546} bothersome.  Bowel Symptom: Bowel movements: *** time(s) per {Time; day/week/month:13537} Stool consistency: {stool consistency:24758} Straining: {yes/no:19897}.  Splinting: {yes/no:19897}.  Incomplete evacuation: {yes/no:19897}.  She {denies/ admits to:24761} accidental bowel leakage / fecal incontinence  Occurs: *** time(s) per {Time; day/week/month:13537}  Consistency with leakage: {stool consistency:24758} Bowel regimen: {bowel regimen:24759} Last colonoscopy: Date ***, Results ***  Sexual  Function Sexually active: {yes/no:19897}.  Sexual orientation: {Sexual [phone]} Pain with sex: {pain with sex:24762}  Pelvic Pain {denies/ admits to:24761} pelvic pain Location: *** Pain occurs: *** Prior pain treatment: *** Improved by: *** Worsened by: ***   Past Medical History:  Past Medical History:  Diagnosis Date  . Diverticulitis of colon with perforation   . Fatty liver   . Hx of breast cancer   . Hyperlipidemia   . Hypertension   . Perforated gastric ulcer ([place])      Past Surgical History:   Past Surgical History:  Procedure Laterality Date  . HUMERUS FRACTURE SURGERY    . left lumpectomy    . MASS EXCISION Left    left axilla     Past OB/GYN History: G{NUMBERS 1-10:18281} P{NUMBERS 1-10:18281} Vaginal deliveries: ***,  Forceps/ Vacuum deliveries: ***, Cesarean section: *** Menopausal: {menopausal:24763} Contraception: ***. Last pap smear was ***.  Any history of abnormal pap smears: {yes/no:19897}.   Medications: She has a current medication list which includes the following prescription(s): acetaminophen, azithromycin, benzonatate, cholecalciferol, losartan, omeprazole, rosuvastatin, trazodone, vitamin b-12, and vitamin e, and the following Facility-Administered Medications: bupivacaine liposome.   Allergies: Patient has No Known Allergies.   Social History:  Social History   Tobacco Use  . Smoking status: Former Smoker    Types: Cigarettes  . Smokeless tobacco: Never Used  Vaping Use  . Vaping Use: Never used  Substance Use Topics  . Alcohol use: Not Currently  . Drug use: No    Relationship status: {relationship status:24764} She lives with ***.   She {ACTION; IS/IS EVO:35009381} employed ***. Regular exercise: {Yes/No:304960894} History of abuse: {Yes/No:304960894}  Family History:   Family History  Problem Relation Age of Onset  . Diabetes Mother   . Breast cancer Sister   .  Pancreatic cancer Cousin   .  Pancreatic cancer Cousin   . Colon cancer Neg Hx   . Esophageal cancer Neg Hx   . Rectal cancer Neg Hx   . Stomach cancer Neg Hx      Review of Systems: ROS   OBJECTIVE Physical Exam: There were no vitals filed for this visit.  Physical Exam   GU / Detailed Urogynecologic Evaluation:  Pelvic Exam: Normal external female genitalia; Bartholin's and Skene's glands normal in appearance; urethral meatus normal in appearance, no urethral masses or discharge.   CST: {gen negative/positive:315881}  Reflexes: bulbocavernosis {DESC; PRESENT/NOT PRESENT:21021351}, anocutaneous {DESC; PRESENT/NOT PRESENT:21021351} ***bilaterally.  Speculum exam reveals normal vaginal mucosa {With/Without:20273} atrophy. Cervix {exam; gyn cervix:30847}. Uterus {exam; pelvic uterus:30849}. Adnexa {exam; adnexa:12223}.    s/p hysterectomy: Speculum exam reveals normal vaginal mucosa {With/Without:20273}  atrophy and normal vaginal cuff.  Adnexa {exam; adnexa:12223}.    With apex supported, anterior compartment defect was {reduced:24765}  Pelvic floor strength {Roman # I-V:19040}/V, puborectalis {Roman # I-V:19040}/V external anal sphincter {Roman # I-V:19040}/V  Pelvic floor musculature: Right levator {Tender/Non-tender:20250}, Right obturator {Tender/Non-tender:20250}, Left levator {Tender/Non-tender:20250}, Left obturator {Tender/Non-tender:20250}  POP-Q:   POP-Q                                               Aa                                               Ba                                                 C                                                Gh                                               Pb                                               tvl                                                Ap                                               Bp  D     Rectal Exam:  Normal sphincter tone, {rectocele:24766} distal rectocele,  enterocoele {DESC; PRESENT/NOT PRESENT:21021351}, no rectal masses, {sign of:24767} dyssynergia when asking the patient to bear down.  Post-Void Residual (PVR) by Bladder Scan: In order to evaluate bladder emptying, we discussed obtaining a postvoid residual and she agreed to this procedure.  Procedure: The ultrasound unit was placed on the patient's abdomen in the suprapubic region after the patient had voided. A PVR of *** ml was obtained by bladder scan.  Laboratory Results: @ENCLABS @   ***I visualized the urine specimen, noting the specimen to be {urine color:24768}  ASSESSMENT AND PLAN Ms. Gellner is a 69 y.o. with: No diagnosis found.    Jaquita Folds, MD   Medical Decision Making:  - Reviewed/ ordered a clinical laboratory test - Reviewed/ ordered a radiologic study - Reviewed/ ordered medicine test - Decision to obtain old records - Discussion of management of or test interpretation with an external physician / other healthcare professional  - Assessment requiring independent historian - Review and summation of prior records - Independent review of image, tracing or specimen

## 2020-09-24 ENCOUNTER — Ambulatory Visit: Payer: Self-pay | Admitting: Obstetrics and Gynecology

## 2020-10-06 ENCOUNTER — Other Ambulatory Visit: Payer: Self-pay | Admitting: Nurse Practitioner

## 2020-10-06 DIAGNOSIS — I10 Essential (primary) hypertension: Secondary | ICD-10-CM

## 2020-10-06 MED ORDER — LOSARTAN POTASSIUM 100 MG PO TABS: 100.0000 mg | ORAL_TABLET | Freq: Every day | ORAL | 0 refills | Status: DC

## 2020-10-06 MED FILL — LOSARTAN POTASSIUM 100 MG T: 100 | 30 days supply | Qty: 30 | Fill #0

## 2020-10-06 NOTE — Telephone Encounter (Signed)
Medication:  losartan (COZAAR) 100 MG tablet [[id number]]  Has the patient contacted their pharmacy? No  (Agent: If no, request that the patient contact the pharmacy for the refill.)   Preferred Pharmacy (with phone number or street name):  [facility] - [place], [place] - [address]  [address], [place] [address]  Phone:  [phone] Fax:  [phone]  Agent: Please be advised that RX refills may take up to 3 business days. We ask that you follow-up with your pharmacy.

## 2020-11-06 ENCOUNTER — Other Ambulatory Visit: Payer: Self-pay | Admitting: Nurse Practitioner

## 2020-11-06 ENCOUNTER — Other Ambulatory Visit: Payer: Self-pay | Admitting: Family Medicine

## 2020-11-06 DIAGNOSIS — I10 Essential (primary) hypertension: Secondary | ICD-10-CM

## 2020-11-06 NOTE — Telephone Encounter (Signed)
Requested medication (s) are due for refill today: yes  Requested medication (s) are on the active medication list: yes  Last refill:  prescription in chart expired on 11/05/20  Future visit scheduled: yes  Notes to clinic:  Please review for refill. Prescription expired on 11/05/20    Requested Prescriptions  Pending Prescriptions Disp Refills   losartan (COZAAR) 100 MG tablet [Pharmacy Med Name: LOSARTAN POTASSIUM 100 MG T 100 Tablet] 30 tablet 0    Sig: Take 1 tablet (100 mg total) by mouth daily.      Cardiovascular:  Angiotensin Receptor Blockers Failed - 11/06/2020  3:13 PM      Failed - Cr in normal range and within 180 days    Creatinine, Ser  Date Value Ref Range Status  03/31/2020 0.60 0.57 - 1.00 mg/dL Final          Failed - K in normal range and within 180 days    Potassium  Date Value Ref Range Status  03/31/2020 4.4 3.5 - 5.2 mmol/L Final          Passed - Patient is not pregnant      Passed - Last BP in normal range    BP Readings from Last 1 Encounters:  09/14/20 135/85          Passed - Valid encounter within last 6 months    Recent Outpatient Visits           1 month ago Acute laryngopharyngitis   [place], PA-C   4 months ago Pelvic pressure in female   [place], Vernia Buff, NP   7 months ago Essential hypertension   [place], Vernia Buff, NP   10 months ago Essential hypertension   [place], Vernia Buff, NP   1 year ago Hyperlipidemia, unspecified hyperlipidemia type   [place], [place], [place]

## 2020-11-09 MED FILL — LOSARTAN POTASSIUM 100 MG T: 100 | 30 days supply | Qty: 30 | Fill #0

## 2020-11-17 MED FILL — LOSARTAN POTASSIUM 100 MG T: 100 | 30 days supply | Qty: 30 | Fill #0

## 2020-12-17 MED FILL — LOSARTAN POTASSIUM 100 MG T: 100 | 30 days supply | Qty: 30 | Fill #1

## 2020-12-25 ENCOUNTER — Other Ambulatory Visit: Payer: Self-pay | Admitting: Nurse Practitioner

## 2020-12-25 ENCOUNTER — Encounter: Payer: Self-pay | Admitting: Nurse Practitioner

## 2020-12-25 ENCOUNTER — Other Ambulatory Visit: Payer: Self-pay

## 2020-12-25 ENCOUNTER — Ambulatory Visit: Payer: Self-pay | Attending: Nurse Practitioner | Admitting: Nurse Practitioner

## 2020-12-25 VITALS — BP 154/81 | HR 73 | Resp 16 | Wt 163.0 lb

## 2020-12-25 DIAGNOSIS — E785 Hyperlipidemia, unspecified: Secondary | ICD-10-CM

## 2020-12-25 DIAGNOSIS — R7309 Other abnormal glucose: Secondary | ICD-10-CM

## 2020-12-25 DIAGNOSIS — K219 Gastro-esophageal reflux disease without esophagitis: Secondary | ICD-10-CM

## 2020-12-25 DIAGNOSIS — D72829 Elevated white blood cell count, unspecified: Secondary | ICD-10-CM

## 2020-12-25 DIAGNOSIS — E559 Vitamin D deficiency, unspecified: Secondary | ICD-10-CM

## 2020-12-25 DIAGNOSIS — I10 Essential (primary) hypertension: Secondary | ICD-10-CM

## 2020-12-25 MED ORDER — OMEPRAZOLE MAGNESIUM 20 MG PO TBEC: 20.0000 mg | DELAYED_RELEASE_TABLET | Freq: Every day | ORAL | 1 refills | Status: DC

## 2020-12-25 MED ORDER — LOSARTAN POTASSIUM 100 MG PO TABS: 100.0000 mg | ORAL_TABLET | Freq: Every day | ORAL | 1 refills | Status: DC

## 2020-12-25 MED ORDER — ROSUVASTATIN CALCIUM 10 MG PO TABS: 10.0000 mg | ORAL_TABLET | Freq: Every day | ORAL | 1 refills | Status: DC

## 2020-12-25 NOTE — Progress Notes (Signed)
Assessment & Plan:  Maria Dominguez was seen today for hypertension and medication refill.  Diagnoses and all orders for this visit:  Essential hypertension -     CMP14+EGFR -     losartan (COZAAR) 100 MG tablet; Take 1 tablet (100 mg total) by mouth daily.  GERD without esophagitis -     omeprazole (PRILOSEC OTC) 20 MG tablet; Take 1 tablet (20 mg total) by mouth daily.  Leukocytosis, unspecified type -     CBC with Differential  Dyslipidemia, goal LDL below 100 -     Lipid panel  Elevated glucose -     Hemoglobin A1c  Vitamin D deficiency disease -     VITAMIN D 25 Hydroxy (Vit-D Deficiency, Fractures)  Hyperlipidemia, unspecified hyperlipidemia type -     rosuvastatin (CRESTOR) 10 MG tablet; Take 1 tablet (10 mg total) by mouth daily.    Patient has been counseled on age-appropriate routine health concerns for screening and prevention. These are reviewed and up-to-date. Referrals have been placed accordingly. Immunizations are up-to-date or declined.    Subjective:   Chief Complaint  Patient presents with  . Hypertension  . Medication Refill   HPI Maria Dominguez 69 y.o. female presents to office today for follow up.  VRI was used to communicate directly with patient for the entire encounter including providing detailed patient instructions.  She has a past medical history of Diverticulitis of colon with perforation, Fatty liver, breast cancer, Hyperlipidemia, Hypertension, and Perforated gastric ulcer.   Essential Hypertension Home reading averages: 130-140/70-80s. She is taking losartan 100 mg daily. Denies chest pain, shortness of breath, palpitations, lightheadedness, dizziness, headaches or BLE edema.  BP Readings from Last 3 Encounters:  12/25/20 (!) 154/81  09/14/20 135/85  08/10/20 (!) 174/78   GERD Symptoms well controlled with omeprazole 20 mg daily.   Review of Systems  Constitutional: Negative for fever, malaise/fatigue and weight loss.  HENT: Negative.   Negative for nosebleeds.   Eyes: Negative.  Negative for blurred vision, double vision and photophobia.  Respiratory: Negative.  Negative for cough and shortness of breath.   Cardiovascular: Negative.  Negative for chest pain, palpitations and leg swelling.  Gastrointestinal: Positive for heartburn. Negative for nausea and vomiting.  Musculoskeletal: Negative.  Negative for myalgias.  Neurological: Negative.  Negative for dizziness, focal weakness, seizures and headaches.  Psychiatric/Behavioral: Negative.  Negative for suicidal ideas.    Past Medical History:  Diagnosis Date  . Diverticulitis of colon with perforation   . Fatty liver   . Hx of breast cancer   . Hyperlipidemia   . Hypertension   . Perforated gastric ulcer ([place])     Past Surgical History:  Procedure Laterality Date  . HUMERUS FRACTURE SURGERY    . left lumpectomy    . MASS EXCISION Left    left axilla    Family History  Problem Relation Age of Onset  . Diabetes Mother   . Breast cancer Sister   . Pancreatic cancer Cousin   . Pancreatic cancer Cousin   . Colon cancer Neg Hx   . Esophageal cancer Neg Hx   . Rectal cancer Neg Hx   . Stomach cancer Neg Hx     Social History Reviewed with no changes to be made today.   Outpatient Medications Prior to Visit  Medication Sig Dispense Refill  . traZODone (DESYREL) 50 MG tablet Take 0.5-1 tablets (25-50 mg total) by mouth at bedtime as needed for sleep. 30 tablet 3  . losartan (  COZAAR) 100 MG tablet TAKE 1 TABLET (100 MG TOTAL) BY MOUTH DAILY. 30 tablet 2  . rosuvastatin (CRESTOR) 10 MG tablet Take 1 tablet (10 mg total) by mouth daily. 90 tablet 1  . cholecalciferol (VITAMIN D3) 25 MCG (1000 UNIT) tablet Take 1,000 Units by mouth daily. (Patient not taking: Reported on 12/25/2020)    . acetaminophen (TYLENOL) 500 MG tablet Take 500 mg by mouth every 6 (six) hours as needed for mild pain. (Patient not taking: Reported on 12/25/2020)    . azithromycin (ZITHROMAX)  250 MG tablet Take 2 tabs PO day 1, then take 1 tab PO once daily (Patient not taking: Reported on 12/25/2020) 6 tablet 0  . benzonatate (TESSALON) 100 MG capsule Take 1 capsule (100 mg total) by mouth 2 (two) times daily as needed for cough. (Patient not taking: Reported on 12/25/2020) 20 capsule 0  . omeprazole (PRILOSEC OTC) 20 MG tablet Take 20 mg by mouth daily. (Patient not taking: Reported on 12/25/2020)    . vitamin B-12 (CYANOCOBALAMIN) 1000 MCG tablet Take 1,000 mcg by mouth daily. (Patient not taking: Reported on 12/25/2020)    . vitamin E 180 MG (400 UNITS) capsule Take 400 Units by mouth daily. (Patient not taking: Reported on 12/25/2020)     Facility-Administered Medications Prior to Visit  Medication Dose Route Frequency Provider Last Rate Last Admin  . bupivacaine liposome (EXPAREL) 1.3 % injection 266 mg  20 mL Infiltration Once Stark Klein, MD        No Known Allergies     Objective:    BP (!) 154/81   Pulse 73   Resp 16   Wt 163 lb (73.9 kg)   SpO2 96%   BMI 32.92 kg/m  Wt Readings from Last 3 Encounters:  12/25/20 163 lb (73.9 kg)  09/14/20 159 lb (72.1 kg)  08/10/20 157 lb 1.6 oz (71.3 kg)    Physical Exam Vitals and nursing note reviewed.  Constitutional:      Appearance: She is well-developed.  HENT:     Head: Normocephalic and atraumatic.  Cardiovascular:     Rate and Rhythm: Normal rate and regular rhythm.     Heart sounds: Normal heart sounds. No murmur heard. No friction rub. No gallop.   Pulmonary:     Effort: Pulmonary effort is normal. No tachypnea or respiratory distress.     Breath sounds: Normal breath sounds. No decreased breath sounds, wheezing, rhonchi or rales.  Chest:     Chest wall: No tenderness.  Abdominal:     General: Bowel sounds are normal.     Palpations: Abdomen is soft.  Musculoskeletal:        General: Normal range of motion.     Cervical back: Normal range of motion.  Skin:    General: Skin is warm and dry.   Neurological:     Mental Status: She is alert and oriented to person, place, and time.     Coordination: Coordination normal.  Psychiatric:        Behavior: Behavior normal. Behavior is cooperative.        Thought Content: Thought content normal.        Judgment: Judgment normal.          Patient has been counseled extensively about nutrition and exercise as well as the importance of adherence with medications and regular follow-up. The patient was given clear instructions to go to ER or return to medical center if symptoms don't improve, worsen or new problems  develop. The patient verbalized understanding.   Follow-up: Return in about 3 months (around 03/27/2021).   [name], FNP-BC [place] [facility] [place], [place]   12/25/2020, 5:10 PM

## 2020-12-26 ENCOUNTER — Other Ambulatory Visit: Payer: Self-pay | Admitting: Nurse Practitioner

## 2020-12-26 DIAGNOSIS — E785 Hyperlipidemia, unspecified: Secondary | ICD-10-CM

## 2020-12-26 LAB — CMP14+EGFR
ALT: 9 IU/L (ref 0–32)
AST: 15 IU/L (ref 0–40)
Albumin/Globulin Ratio: 1.2 (ref 1.2–2.2)
Albumin: 4 g/dL (ref 3.8–4.8)
Alkaline Phosphatase: 164 IU/L — ABNORMAL HIGH (ref 44–121)
BUN/Creatinine Ratio: 43 — ABNORMAL HIGH (ref 12–28)
BUN: 20 mg/dL (ref 8–27)
Bilirubin Total: <0.2 mg/dL (ref 0.0–1.2)
CO2: 20 mmol/L (ref 20–29)
Calcium: 8.8 mg/dL (ref 8.7–10.3)
Chloride: 100 mmol/L (ref 96–106)
Creatinine, Ser: 0.47 mg/dL — ABNORMAL LOW (ref 0.57–1.00)
Globulin, Total: 3.3 g/dL (ref 1.5–4.5)
Glucose: 94 mg/dL (ref 65–99)
Potassium: 4.4 mmol/L (ref 3.5–5.2)
Sodium: 138 mmol/L (ref 134–144)
Total Protein: 7.3 g/dL (ref 6.0–8.5)
eGFR: 102 mL/min/{1.73_m2} (ref >59)

## 2020-12-26 LAB — LIPID PANEL
Chol/HDL Ratio: 4.3 ratio (ref 0.0–4.4)
Cholesterol, Total: 151 mg/dL (ref 100–199)
HDL: 35 mg/dL — ABNORMAL LOW (ref >39)
LDL Chol Calc (NIH): 33 mg/dL (ref 0–99)
Triglycerides: 607 mg/dL (ref 0–149)
VLDL Cholesterol Cal: 83 mg/dL — ABNORMAL HIGH (ref 5–40)

## 2020-12-26 LAB — CBC WITH DIFFERENTIAL/PLATELET
Basophils Absolute: 0 10*3/uL (ref 0.0–0.2)
Basos: 0 %
EOS (ABSOLUTE): 0.4 10*3/uL (ref 0.0–0.4)
Eos: 5 %
Hematocrit: 33.2 % — ABNORMAL LOW (ref 34.0–46.6)
Hemoglobin: 10.9 g/dL — ABNORMAL LOW (ref 11.1–15.9)
Immature Grans (Abs): 0.1 10*3/uL (ref 0.0–0.1)
Immature Granulocytes: 1 %
Lymphocytes Absolute: 1.2 10*3/uL (ref 0.7–3.1)
Lymphs: 15 %
MCH: 26.3 pg — ABNORMAL LOW (ref 26.6–33.0)
MCHC: 32.8 g/dL (ref 31.5–35.7)
MCV: 80 fL (ref 79–97)
Monocytes Absolute: 0.7 10*3/uL (ref 0.1–0.9)
Monocytes: 9 %
Neutrophils Absolute: 5.5 10*3/uL (ref 1.4–7.0)
Neutrophils: 70 %
Platelets: 384 10*3/uL (ref 150–450)
RBC: 4.14 x10E6/uL (ref 3.77–5.28)
RDW: 13.4 % (ref 11.7–15.4)
WBC: 8 10*3/uL (ref 3.4–10.8)

## 2020-12-26 LAB — VITAMIN D 25 HYDROXY (VIT D DEFICIENCY, FRACTURES): Vit D, 25-Hydroxy: 24.5 ng/mL — ABNORMAL LOW (ref 30.0–100.0)

## 2020-12-26 LAB — HEMOGLOBIN A1C
Est. average glucose Bld gHb Est-mCnc: 123 mg/dL
Hgb A1c MFr Bld: 5.9 % — ABNORMAL HIGH (ref 4.8–5.6)

## 2020-12-26 MED ORDER — ROSUVASTATIN CALCIUM 20 MG PO TABS: 20.0000 mg | ORAL_TABLET | Freq: Every day | ORAL | 3 refills | Status: DC

## 2020-12-28 MED FILL — ROSUVASTATIN CALCIUM 10 MG: 10 | 30 days supply | Qty: 30 | Fill #0

## 2020-12-28 MED FILL — OMEPRAZOLE 20 MG CAP: 20 | 30 days supply | Qty: 30 | Fill #0

## 2021-01-11 ENCOUNTER — Other Ambulatory Visit: Payer: Self-pay

## 2021-01-11 ENCOUNTER — Other Ambulatory Visit: Payer: Self-pay | Admitting: Nurse Practitioner

## 2021-01-11 DIAGNOSIS — F5101 Primary insomnia: Secondary | ICD-10-CM

## 2021-01-11 MED ORDER — TRAZODONE HCL 50 MG PO TABS
ORAL_TABLET | ORAL | 2 refills | Status: DC
  Filled 2021-01-11: qty 30, 30d supply, fill #0

## 2021-01-11 MED FILL — Rosuvastatin Calcium Tab 20 MG: ORAL | 30 days supply | Qty: 30 | Fill #0 | Status: AC

## 2021-01-11 MED FILL — Omeprazole Cap Delayed Release 20 MG: ORAL | 30 days supply | Qty: 30 | Fill #0 | Status: AC

## 2021-01-11 NOTE — Telephone Encounter (Signed)
Requested Prescriptions  Pending Prescriptions Disp Refills  . traZODone (DESYREL) 50 MG tablet 30 tablet 3    Sig: TAKE 0.5-1 TABLETS (25-50 MG TOTAL) BY MOUTH AT BEDTIME AS NEEDED FOR SLEEP.     Psychiatry: Antidepressants - Serotonin Modulator Passed - 01/11/2021  5:06 PM      Passed - Valid encounter within last 6 months    Recent Outpatient Visits          2 weeks ago Essential hypertension   [place], NP   3 months ago Acute laryngopharyngitis   [place], [place]   6 months ago Pelvic pressure in female   [place], Vernia Buff, NP   9 months ago Essential hypertension   [place], Vernia Buff, NP   1 year ago Essential hypertension   [place], Zelda W, NP      Future Appointments            In 2 months [name], NP [place]

## 2021-01-12 ENCOUNTER — Other Ambulatory Visit: Payer: Self-pay

## 2021-02-01 ENCOUNTER — Other Ambulatory Visit: Payer: Self-pay

## 2021-02-01 ENCOUNTER — Other Ambulatory Visit: Payer: Self-pay | Admitting: Nurse Practitioner

## 2021-02-01 ENCOUNTER — Ambulatory Visit: Payer: Self-pay | Attending: Nurse Practitioner

## 2021-02-01 DIAGNOSIS — E781 Pure hyperglyceridemia: Secondary | ICD-10-CM

## 2021-02-01 MED FILL — Losartan Potassium Tab 100 MG: ORAL | 30 days supply | Qty: 30 | Fill #0 | Status: AC

## 2021-02-02 ENCOUNTER — Other Ambulatory Visit: Payer: Self-pay

## 2021-02-02 ENCOUNTER — Ambulatory Visit: Payer: Self-pay | Attending: Nurse Practitioner

## 2021-02-02 DIAGNOSIS — E781 Pure hyperglyceridemia: Secondary | ICD-10-CM

## 2021-02-03 LAB — LIPID PANEL
Chol/HDL Ratio: 2.3 ratio (ref 0.0–4.4)
Cholesterol, Total: 105 mg/dL (ref 100–199)
HDL: 46 mg/dL (ref >39)
LDL Chol Calc (NIH): 39 mg/dL (ref 0–99)
Triglycerides: 112 mg/dL (ref 0–149)
VLDL Cholesterol Cal: 20 mg/dL (ref 5–40)

## 2021-02-05 ENCOUNTER — Other Ambulatory Visit: Payer: Self-pay

## 2021-03-19 ENCOUNTER — Other Ambulatory Visit: Payer: Self-pay

## 2021-03-29 ENCOUNTER — Ambulatory Visit: Payer: Self-pay | Admitting: Nurse Practitioner

## 2021-03-31 ENCOUNTER — Other Ambulatory Visit: Payer: Self-pay

## 2021-03-31 ENCOUNTER — Ambulatory Visit: Payer: Self-pay | Admitting: Physician Assistant

## 2021-03-31 VITALS — BP 156/72 | HR 76 | Temp 98.2°F | Resp 18 | Ht 62.0 in | Wt 144.0 lb

## 2021-03-31 DIAGNOSIS — I10 Essential (primary) hypertension: Secondary | ICD-10-CM

## 2021-03-31 DIAGNOSIS — K5720 Diverticulitis of large intestine with perforation and abscess without bleeding: Secondary | ICD-10-CM

## 2021-03-31 DIAGNOSIS — E785 Hyperlipidemia, unspecified: Secondary | ICD-10-CM

## 2021-03-31 DIAGNOSIS — R7303 Prediabetes: Secondary | ICD-10-CM

## 2021-03-31 DIAGNOSIS — E559 Vitamin D deficiency, unspecified: Secondary | ICD-10-CM

## 2021-03-31 DIAGNOSIS — K649 Unspecified hemorrhoids: Secondary | ICD-10-CM

## 2021-03-31 MED ORDER — ROSUVASTATIN CALCIUM 20 MG PO TABS
ORAL_TABLET | Freq: Every day | ORAL | 3 refills | Status: DC
  Filled 2021-03-31: qty 30, 30d supply, fill #0
  Filled 2021-05-07: qty 30, 30d supply, fill #1

## 2021-03-31 MED ORDER — LOSARTAN POTASSIUM 100 MG PO TABS
ORAL_TABLET | Freq: Every day | ORAL | 1 refills | Status: DC
  Filled 2021-03-31: qty 30, 30d supply, fill #0
  Filled 2021-05-07 – 2021-06-04 (×2): qty 30, 30d supply, fill #1
  Filled 2021-06-29: qty 30, 30d supply, fill #2
  Filled 2021-08-19: qty 30, 30d supply, fill #3

## 2021-03-31 MED ORDER — OMEPRAZOLE 20 MG PO CPDR
DELAYED_RELEASE_CAPSULE | Freq: Every day | ORAL | 1 refills | Status: DC
  Filled 2021-03-31: qty 30, 30d supply, fill #0
  Filled 2021-05-07: qty 30, 30d supply, fill #1
  Filled 2021-06-29: qty 30, 30d supply, fill #2

## 2021-03-31 MED ORDER — VITAMIN D 25 MCG (1000 UNIT) PO TABS
1000.0000 [IU] | ORAL_TABLET | Freq: Every day | ORAL | 1 refills | Status: DC
  Filled 2021-03-31 – 2022-01-07 (×2): qty 90, 90d supply, fill #0

## 2021-03-31 NOTE — Progress Notes (Signed)
Established Patient Office Visit  Subjective:  Patient ID: Maria Dominguez, female    DOB: 1951-02-17  Age: 70 y.o. MRN: [id number]  CC:  Chief Complaint  Patient presents with   Hypertension    Refill    HPI Maria Dominguez presents for medication refills without any complaints    States thtat she has been doing well overall, states that she has been out of her medications for the past 4 days; was 13 minutes late for her appt with her PCP yesterday and was not able to be seen.  BP at home WNL with her medication  States that she stopped taking the trazodone, states that she is sleeping well without.  States that she has a "hole in her intestines" ; denies pain  States that she had a BM with blood last week on Thursday, denies pain, states that was blood on tissue and in toilet, bright red blood - has not tried anything for relief- has resolved.  Does endorse that she did have to strain to go.  Maria Dominguez is present and helps with history.  Due to language barrier, an interpreter was present during the history-taking and subsequent discussion (and for part of the physical exam) with this patient.   Past Medical History:  Diagnosis Date   Diverticulitis of colon with perforation    Fatty liver    Hx of breast cancer    Hyperlipidemia    Hypertension    Perforated gastric ulcer ([place])     Past Surgical History:  Procedure Laterality Date   HUMERUS FRACTURE SURGERY     left lumpectomy     MASS EXCISION Left    left axilla    Family History  Problem Relation Age of Onset   Diabetes Mother    Breast cancer Sister    Pancreatic cancer Cousin    Pancreatic cancer Cousin    Colon cancer Neg Hx    Esophageal cancer Neg Hx    Rectal cancer Neg Hx    Stomach cancer Neg Hx     Social History   Socioeconomic History   Marital status: Single    Spouse name: Not on file   Number of children: 2   Years of education: Not on file   Highest education level: 10th grade   Occupational History   Not on file  Tobacco Use   Smoking status: Former    Pack years: 0.00    Types: Cigarettes   Smokeless tobacco: Never  Vaping Use   Vaping Use: Never used  Substance and Sexual Activity   Alcohol use: Not Currently   Drug use: No   Sexual activity: Not Currently  Other Topics Concern   Not on file  Social History Narrative   Not on file   Social Determinants of Health   Financial Resource Strain: Not on file  Food Insecurity: Not on file  Transportation Needs: Not on file  Physical Activity: Not on file  Stress: Not on file  Social Connections: Not on file  Intimate Partner Violence: Not on file    Outpatient Medications Prior to Visit  Medication Sig Dispense Refill   losartan (COZAAR) 100 MG tablet TAKE 1 TABLET (100 MG TOTAL) BY MOUTH DAILY. 90 tablet 1   omeprazole (PRILOSEC) 20 MG capsule TAKE 1 TABLET (20 MG TOTAL) BY MOUTH DAILY. 90 capsule 1   rosuvastatin (CRESTOR) 20 MG tablet TAKE 1 TABLET (20 MG TOTAL) BY MOUTH DAILY. 90 tablet 3   traZODone (DESYREL) 50  MG tablet TAKE 0.5-1 TABLETS (25-50 MG TOTAL) BY MOUTH AT BEDTIME AS NEEDED FOR SLEEP. 30 tablet 2   cholecalciferol (VITAMIN D3) 25 MCG (1000 UNIT) tablet Take 1,000 Units by mouth daily. (Patient not taking: No sig reported)     omeprazole (PRILOSEC OTC) 20 MG tablet Take 1 tablet (20 mg total) by mouth daily. 90 tablet 1   Facility-Administered Medications Prior to Visit  Medication Dose Route Frequency Provider Last Rate Last Admin   bupivacaine liposome (EXPAREL) 1.3 % injection 266 mg  20 mL Infiltration Once Stark Klein, MD        No Known Allergies  ROS Review of Systems  Constitutional:  Negative for chills and fever.  HENT: Negative.    Eyes: Negative.   Respiratory:  Negative for shortness of breath.   Cardiovascular:  Negative for chest pain.  Gastrointestinal:  Positive for constipation. Negative for abdominal pain, blood in stool, nausea and vomiting.   Endocrine: Negative.   Genitourinary: Negative.   Musculoskeletal: Negative.   Skin: Negative.   Allergic/Immunologic: Negative.   Neurological:  Negative for weakness and headaches.  Hematological: Negative.   Psychiatric/Behavioral:  Negative for self-injury, sleep disturbance and suicidal ideas.      Objective:    Physical Exam Vitals and nursing note reviewed.  Constitutional:      Appearance: Normal appearance.  HENT:     Head: Normocephalic and atraumatic.     Right Ear: External ear normal.     Left Ear: External ear normal.     Nose: Nose normal.     Mouth/Throat:     Mouth: Mucous membranes are moist.     Pharynx: Oropharynx is clear.  Eyes:     Extraocular Movements: Extraocular movements intact.     Conjunctiva/sclera: Conjunctivae normal.     Pupils: Pupils are equal, round, and reactive to light.  Cardiovascular:     Rate and Rhythm: Normal rate and regular rhythm.     Pulses: Normal pulses.     Heart sounds: Normal heart sounds.  Pulmonary:     Effort: Pulmonary effort is normal.     Breath sounds: Normal breath sounds.  Abdominal:     General: Bowel sounds are normal.     Tenderness: There is no abdominal tenderness.  Musculoskeletal:        General: Normal range of motion.     Cervical back: Normal range of motion and neck supple.  Skin:    General: Skin is warm and dry.  Neurological:     General: No focal deficit present.     Mental Status: She is alert and oriented to person, place, and time.  Psychiatric:        Mood and Affect: Mood normal.        Behavior: Behavior normal.        Thought Content: Thought content normal.        Judgment: Judgment normal.    BP (!) 156/72 (BP Location: Right Arm, Patient Position: Sitting, Cuff Size: Normal)   Pulse 76   Temp 98.2 F (36.8 C) (Oral)   Resp 18   Ht '5\' 2"'  (1.575 m)   Wt 144 lb (65.3 kg)   SpO2 98%   BMI 26.34 kg/m  Wt Readings from Last 3 Encounters:  03/31/21 144 lb (65.3 kg)   12/25/20 163 lb (73.9 kg)  09/14/20 159 lb (72.1 kg)     Health Maintenance Due  Topic Date Due   COVID-19 Vaccine (1) Never  done   TETANUS/TDAP  Never done   MAMMOGRAM  Never done   Zoster Vaccines- Shingrix (1 of 2) Never done   DEXA SCAN  Never done   PNA vac Low Risk Adult (1 of 2 - PCV13) Never done    There are no preventive care reminders to display for this patient.  No results found for: TSH Lab Results  Component Value Date   WBC 8.0 12/25/2020   HGB 10.9 (L) 12/25/2020   HCT 33.2 (L) 12/25/2020   MCV 80 12/25/2020   PLT 384 12/25/2020   Lab Results  Component Value Date   NA 138 12/25/2020   K 4.4 12/25/2020   CO2 20 12/25/2020   GLUCOSE 94 12/25/2020   BUN 20 12/25/2020   CREATININE 0.47 (L) 12/25/2020   BILITOT <0.2 12/25/2020   ALKPHOS 164 (H) 12/25/2020   AST 15 12/25/2020   ALT 9 12/25/2020   PROT 7.3 12/25/2020   ALBUMIN 4.0 12/25/2020   CALCIUM 8.8 12/25/2020   ANIONGAP 8 11/29/2019   EGFR 102 12/25/2020   GFR 88.66 02/05/2020   Lab Results  Component Value Date   CHOL 105 02/02/2021   Lab Results  Component Value Date   HDL 46 02/02/2021   Lab Results  Component Value Date   LDLCALC 39 02/02/2021   Lab Results  Component Value Date   TRIG 112 02/02/2021   Lab Results  Component Value Date   CHOLHDL 2.3 02/02/2021   Lab Results  Component Value Date   HGBA1C 5.9 (H) 12/25/2020      Assessment & Plan:   Problem List Items Addressed This Visit       Cardiovascular and Mediastinum   Essential hypertension - Primary   Relevant Medications   losartan (COZAAR) 100 MG tablet   rosuvastatin (CRESTOR) 20 MG tablet   Hemorrhoids   Relevant Medications   losartan (COZAAR) 100 MG tablet   rosuvastatin (CRESTOR) 20 MG tablet     Digestive   Diverticulitis of colon with perforation   Relevant Medications   omeprazole (PRILOSEC) 20 MG capsule     Other   Hyperlipidemia   Relevant Medications   losartan (COZAAR) 100 MG  tablet   rosuvastatin (CRESTOR) 20 MG tablet   Vitamin D deficiency   Relevant Medications   cholecalciferol (VITAMIN D3) 25 MCG (1000 UNIT) tablet   Prediabetes    Meds ordered this encounter  Medications   losartan (COZAAR) 100 MG tablet    Sig: TAKE 1 TABLET (100 MG TOTAL) BY MOUTH DAILY.    Dispense:  90 tablet    Refill:  1    Order Specific Question:   Supervising Provider    Answer:   Asencion Noble E [[id number]]   rosuvastatin (CRESTOR) 20 MG tablet    Sig: TAKE 1 TABLET (20 MG TOTAL) BY MOUTH DAILY.    Dispense:  90 tablet    Refill:  3    Order Specific Question:   Supervising Provider    Answer:   Asencion Noble E [[id number]]   cholecalciferol (VITAMIN D3) 25 MCG (1000 UNIT) tablet    Sig: Take 1 tablet (1,000 Units total) by mouth daily.    Dispense:  90 tablet    Refill:  1    Order Specific Question:   Supervising Provider    Answer:   Asencion Noble E [[id number]]   omeprazole (PRILOSEC) 20 MG capsule    Sig: TAKE 1 TABLET (20 MG TOTAL) BY MOUTH DAILY.  Dispense:  90 capsule    Refill:  1    Order Specific Question:   Supervising Provider    Answer:   WRIGHT, PATRICK E [[id number]]    1. Essential hypertension Resume current regimen.  Patient encouraged to check blood pressure at home, keep a written log and have available for all office visits.  Red flags given for prompt reevaluation. - losartan (COZAAR) 100 MG tablet; TAKE 1 TABLET (100 MG TOTAL) BY MOUTH DAILY.  Dispense: 90 tablet; Refill: 1  2. Hyperlipidemia, unspecified hyperlipidemia type Resume current regimen - rosuvastatin (CRESTOR) 20 MG tablet; TAKE 1 TABLET (20 MG TOTAL) BY MOUTH DAILY.  Dispense: 90 tablet; Refill: 3  3. Diverticulitis of colon with perforation On review of patient's history, she did have an episode of diverticulitis of colon with perforation.  This was managed by gastroenterology and was followed up with a colonoscopy. - omeprazole (PRILOSEC) 20 MG capsule; TAKE 1 TABLET (20 MG TOTAL) BY  MOUTH DAILY.  Dispense: 90 capsule; Refill: 1  4. Hemorrhoids, unspecified hemorrhoid type Episode of bright red bleeding more than likely hemorrhoidal bleeding.  Patient does have history of hemorrhoids on colonoscopy.  Patient education given on preventative measures  5. Vitamin D deficiency Continue current regimen - cholecalciferol (VITAMIN D3) 25 MCG (1000 UNIT) tablet; Take 1 tablet (1,000 Units total) by mouth daily.  Dispense: 90 tablet; Refill: 1  6. Prediabetes Patient education given on low sugar diet.  No labs needed today.  Patient encouraged to make follow-up appointment with primary care provider.   I have reviewed the patient's medical history (PMH, PSH, Social History, Family History, Medications, and allergies) , and have been updated if relevant. I spent 31 minutes reviewing chart and  face to face time with patient.    Follow-up: Return if symptoms worsen or fail to improve.    Loraine Grip Mayers, PA-C

## 2021-03-31 NOTE — Patient Instructions (Signed)
Hemorroides Hemorrhoids Las hemorroides son venas inflamadas adentro o alrededor del recto o del ano. Hay dos tipos de hemorroides: Hemorroides internas. Se forman en las venas del interior del recto. Pueden abultarse hacia afuera, irritarse y doler. Hemorroides externas. Se producen en las venas externas del ano y pueden sentirse como un bulto o zona hinchada, dura y dolorosa cerca del ano. [place] hemorroides no causan problemas graves y se Engineer, petroleum con tratamientos caseros [employer] cambios en la dieta y el estilo de vida. Si los tratamientos caseros no ayudan con los sntomas, se pueden realizarprocedimientos para reducir o extirpar las hemorroides. Cules son las causas? La causa de esta afeccin es el aumento de la presin en la zona anal. Esta presin puede ser causada por distintos factores, por ejemplo: Estreimiento. Hacer un gran esfuerzo para defecar. Diarrea. Embarazo. Obesidad. Estar sentado durante largos perodos de [place]. [place] objetos pesados u otras actividades que impliquen esfuerzo. Sexo anal. Andar en bicicleta por un largo perodo de tiempo. Cules son los signos o los sntomas? Los sntomas de esta afeccin incluyen los siguientes: Social research officer, government. Picazn o irritacin anal. Sangrado rectal. Prdida de materia fecal (heces). Inflamacin anal. Uno o ms bultos alrededor del ano. Cmo se diagnostica? Esta afeccin se diagnostica frecuentemente a travs de un examen visual. Posiblemente le realicen otros tipos de pruebas o estudios, como los siguientes: Un examen que implica palpar el rea rectal con la mano enguantada (examen rectal digital). Un examen del canal anal que se realiza utilizando un pequeo tubo (anoscopio). Anlisis de sangre si ha perdido [place] cantidad significativa de [place]. Una prueba que consiste en la observacin del interior del colon utilizando un tubo flexible con una cmara en el extremo (sigmoidoscopia o colonoscopa). Cmo  se trata? Esta afeccin generalmente se puede tratar en el hogar. Sin embargo, se pueden TEFL teacher procedimientos si los cambios en la dieta, en el estilo de vida y otros tratamientos caseros no [employer] sntomas. Estos procedimientos pueden ayudar a reducir o [place] hemorroides completamente. Algunos de estos procedimientos son quirrgicos y otros no. Algunos de los procedimientos ms frecuentes son los siguientes: Ligadura con Forensic psychologist. Las bandas elsticas se colocan en la base de las hemorroides para interrumpir su irrigacin de [place]. Escleroterapia. Se inyecta un medicamento en las hemorroides para reducir su tamao. Coagulacin con luz infrarroja. Se utiliza un tipo de energa lumnica para eliminar las hemorroides. Hemorroidectoma. Las hemorroides se extirpan con [place] y las venas que las [place] se IT consultant. Hemorroidopexia con grapas. El cirujano engrapa la base de las hemorroides a la pared del recto. Siga estas indicaciones en su casa: Comida y bebida  Consuma alimentos con alto contenido de [place], como cereales integrales, porotos, frutos secos, frutas y verduras. Pregntele a su mdico acerca de tomar productos con fibra aadida en ellos (complementos de fibra). Disminuya la cantidad de grasa de la dieta. Esto se puede lograr consumiendo productos lcteos con bajo contenido de grasas, ingiriendo menor cantidad de carnes rojas y evitando los alimentos procesados. Beba suficiente lquido como para Theatre manager la orina de color amarillo plido.  Control del dolor y la hinchazn  Tome baos de asiento tibios durante 20 minutos, 3 o 4 veces por da para Glass blower/designer y las [place]. Puede hacer esto en una baera o usar un dispositivo porttil para bao de asiento que se coloca sobre el inodoro. Si se lo indican, aplique hielo en la zona afectada. Usar compresas de [employer] baos de asiento  puede ser efectivo. Ponga el hielo en una bolsa plstica. Coloque  una toalla entre la piel y Therapist, nutritional. Coloque el hielo durante 20 minutos, 2 a 3 veces por da.  Indicaciones generales [employer] de venta libre y los recetados solamente como se lo haya indicado el mdico. Aplquese los medicamentos, cremas o supositorios como se lo hayan indicado. Haga ejercicio con regularidad. Consulte al mdico qu cantidad y qu tipo de ejercicio es mejor para usted. En general, debe realizar al menos 30 minutos de ejercicio moderado la [employer] de la semana (150 minutos cada semana). Esto puede incluir [employer], andar en bicicleta o practicar yoga. Vaya al bao cuando sienta la necesidad de defecar. No espere. Evite hacer fuerza en las deposiciones. Mantenga la zona anal limpia y seca. Use papel higinico hmedo o toallitas humedecidas despus de las deposiciones. No pase mucho tiempo sentado en el inodoro. Esto aumenta la afluencia de sangre y Conservation officer, historic buildings. Concurra a todas las visitas de seguimiento como se lo haya indicado el mdico. Esto es importante. Comunquese con un mdico si tiene: Aumento del dolor y la hinchazn que no puede controlar con medicamentos o Clinical research associate. No puede defecar o lo hace con dificultad. Dolor o tiene inflamacin fuera de la zona de las hemorroides. Solicite ayuda inmediatamente si tiene: Hemorragia descontrolada en el recto. Resumen Las hemorroides son venas inflamadas adentro o alrededor del recto o del ano. La mayora de las hemorroides se pueden controlar con tratamientos caseros como cambios en la dieta y el estilo de vida. Tomar baos de asiento con agua tibia puede ayudar a Best boy y las [place]. En los casos graves, se pueden realizar procedimientos o Ardelia Mems ciruga para reducir o [place] hemorroides. Esta informacin no tiene Marine scientist el consejo del mdico. Asegresede hacerle al mdico cualquier pregunta que tenga. Document Revised: 04/05/2018 Document Reviewed:  04/05/2018 Elsevier Patient Education  [place].

## 2021-03-31 NOTE — Progress Notes (Signed)
Patient verified DOB Patient has been out since last Thursday.  Patient has eaten today. Patient denies pain

## 2021-04-01 ENCOUNTER — Other Ambulatory Visit: Payer: Self-pay

## 2021-04-01 DIAGNOSIS — E785 Hyperlipidemia, unspecified: Secondary | ICD-10-CM | POA: Insufficient documentation

## 2021-04-01 DIAGNOSIS — R7303 Prediabetes: Secondary | ICD-10-CM | POA: Insufficient documentation

## 2021-04-01 DIAGNOSIS — K649 Unspecified hemorrhoids: Secondary | ICD-10-CM | POA: Insufficient documentation

## 2021-04-01 DIAGNOSIS — E559 Vitamin D deficiency, unspecified: Secondary | ICD-10-CM | POA: Insufficient documentation

## 2021-05-07 ENCOUNTER — Other Ambulatory Visit: Payer: Self-pay

## 2021-06-04 ENCOUNTER — Other Ambulatory Visit: Payer: Self-pay

## 2021-06-29 ENCOUNTER — Encounter: Payer: Self-pay | Admitting: Nurse Practitioner

## 2021-06-29 ENCOUNTER — Other Ambulatory Visit: Payer: Self-pay

## 2021-06-29 ENCOUNTER — Ambulatory Visit: Payer: Self-pay | Attending: Nurse Practitioner | Admitting: Nurse Practitioner

## 2021-06-29 VITALS — BP 157/80 | HR 71 | Ht 62.0 in | Wt 161.5 lb

## 2021-06-29 DIAGNOSIS — Z1231 Encounter for screening mammogram for malignant neoplasm of breast: Secondary | ICD-10-CM

## 2021-06-29 DIAGNOSIS — D72829 Elevated white blood cell count, unspecified: Secondary | ICD-10-CM

## 2021-06-29 DIAGNOSIS — Z13820 Encounter for screening for osteoporosis: Secondary | ICD-10-CM

## 2021-06-29 DIAGNOSIS — E559 Vitamin D deficiency, unspecified: Secondary | ICD-10-CM

## 2021-06-29 DIAGNOSIS — R7303 Prediabetes: Secondary | ICD-10-CM

## 2021-06-29 DIAGNOSIS — I10 Essential (primary) hypertension: Secondary | ICD-10-CM

## 2021-06-29 DIAGNOSIS — E785 Hyperlipidemia, unspecified: Secondary | ICD-10-CM

## 2021-06-29 LAB — GLUCOSE, POCT (MANUAL RESULT ENTRY): POC Glucose: 91 mg/dl (ref 70–99)

## 2021-06-29 LAB — POCT GLYCOSYLATED HEMOGLOBIN (HGB A1C): Hemoglobin A1C: 5.8 % — AB (ref 4.0–5.6)

## 2021-06-29 MED ORDER — MELOXICAM 7.5 MG PO TABS
7.5000 mg | ORAL_TABLET | Freq: Every day | ORAL | 1 refills | Status: DC
  Filled 2021-06-29: qty 30, 30d supply, fill #0
  Filled 2021-09-10: qty 30, 30d supply, fill #1

## 2021-06-29 NOTE — Progress Notes (Signed)
Assessment & Plan:  Madeliene was seen today for prediabetes.  Diagnoses and all orders for this visit:  Primary hypertension Continue all antihypertensives as prescribed.  Remember to bring in your blood pressure log with you for your follow up appointment.  DASH/Mediterranean Diets are healthier choices for HTN.    Prediabetes -     Hemoglobin A1c Continue blood sugar control as discussed in office today, low carbohydrate diet, and regular physical exercise as tolerated, 150 minutes per week (30 min each day, 5 days per week, or 50 min 3 days per week). Keep blood sugar logs with fasting goal of 90-130 mg/dl, post prandial (after you eat) less than 180.  For Hypoglycemia: BS <60 and Hyperglycemia BS >400; contact the clinic ASAP. Annual eye exams and foot exams are recommended.   Vitamin D deficiency disease -     VITAMIN D 25 Hydroxy (Vit-D Deficiency, Fractures)  Dyslipidemia, goal LDL below 100 -     Lipid panel INSTRUCTIONS: Work on a low fat, heart healthy diet and participate in regular aerobic exercise program by working out at least 150 minutes per week; 5 days a week-30 minutes per day. Avoid red meat/beef/steak,  fried foods. junk foods, sodas, sugary drinks, unhealthy snacking, alcohol and smoking.  Drink at least 80 oz of water per day and monitor your carbohydrate intake daily.    Leukocytosis, unspecified type -     CBC with Differential   Patient has been counseled on age-appropriate routine health concerns for screening and prevention. These are reviewed and up-to-date. Referrals have been placed accordingly. Immunizations are up-to-date or declined.    Subjective:   Chief Complaint  Patient presents with   Prediabetes   HPI Charese Gainer 70 y.o. female presents to office today for follow up to prediabetes and HTN  She has a past medical history of Diverticulitis of colon with perforation, Fatty liver, breast cancer, Hyperlipidemia, Hypertension, and Perforated  gastric ulcer.   VRI was used to communicate directly with patient for the entire encounter including providing detailed patient instructions.     Arthralgias She has chronic low back pain, left knee, hip pain and left heel pain. Denies any injury or trauma.   Prediabetes Well controlled. She does not take any oral diabetic medications for this nor does she check her blood glucose levels at home as she does not have a meter. LDL at goal with rosuvastatin 20 mg daily.  Lab Results  Component Value Date   HGBA1C 5.9 (H) 12/25/2020    Lab Results  Component Value Date   HGBA1C 5.9 (H) 12/25/2020    Lab Results  Component Value Date   LDLCALC 39 02/02/2021     HTN Home readings 130-140/70s.  I will not make any changes with her medications today as home readings are lower. She will continue on losartan 100 mg daily.  Denies chest pain, shortness of breath, palpitations, lightheadedness, dizziness, headaches or BLE edema.  BP Readings from Last 3 Encounters:  06/29/21 (!) 157/80  03/31/21 (!) 156/72  12/25/20 (!) 154/81     Review of Systems  Constitutional:  Negative for fever, malaise/fatigue and weight loss.  HENT: Negative.  Negative for nosebleeds.   Eyes: Negative.  Negative for blurred vision, double vision and photophobia.  Respiratory: Negative.  Negative for cough and shortness of breath.   Cardiovascular: Negative.  Negative for chest pain, palpitations and leg swelling.  Gastrointestinal: Negative.  Negative for heartburn, nausea and vomiting.  Musculoskeletal: Negative.  Negative for myalgias.  Neurological: Negative.  Negative for dizziness, focal weakness, seizures and headaches.  Psychiatric/Behavioral: Negative.  Negative for suicidal ideas.    Past Medical History:  Diagnosis Date   Diverticulitis of colon with perforation    Fatty liver    Hx of breast cancer    Hyperlipidemia    Hypertension    Perforated gastric ulcer ([place])     Past Surgical  History:  Procedure Laterality Date   HUMERUS FRACTURE SURGERY     left lumpectomy     MASS EXCISION Left    left axilla    Family History  Problem Relation Age of Onset   Diabetes Mother    Breast cancer Sister    Pancreatic cancer Cousin    Pancreatic cancer Cousin    Colon cancer Neg Hx    Esophageal cancer Neg Hx    Rectal cancer Neg Hx    Stomach cancer Neg Hx     Social History Reviewed with no changes to be made today.   Outpatient Medications Prior to Visit  Medication Sig Dispense Refill   cholecalciferol (VITAMIN D3) 25 MCG (1000 UNIT) tablet Take 1 tablet (1,000 Units total) by mouth daily. 90 tablet 1   losartan (COZAAR) 100 MG tablet TAKE 1 TABLET (100 MG TOTAL) BY MOUTH DAILY. 90 tablet 1   omeprazole (PRILOSEC) 20 MG capsule TAKE 1 TABLET (20 MG TOTAL) BY MOUTH DAILY. 90 capsule 1   rosuvastatin (CRESTOR) 20 MG tablet TAKE 1 TABLET (20 MG TOTAL) BY MOUTH DAILY. 90 tablet 3   Facility-Administered Medications Prior to Visit  Medication Dose Route Frequency Provider Last Rate Last Admin   bupivacaine liposome (EXPAREL) 1.3 % injection 266 mg  20 mL Infiltration Once Stark Klein, MD        No Known Allergies     Objective:    BP (!) 157/80   Pulse 71   Ht 5\' 2"  (1.575 m)   Wt 161 lb 8 oz (73.3 kg)   SpO2 98%   BMI 29.54 kg/m  Wt Readings from Last 3 Encounters:  06/29/21 161 lb 8 oz (73.3 kg)  03/31/21 144 lb (65.3 kg)  12/25/20 163 lb (73.9 kg)    Physical Exam Vitals and nursing note reviewed.  Constitutional:      Appearance: She is well-developed.  HENT:     Head: Normocephalic and atraumatic.  Cardiovascular:     Rate and Rhythm: Normal rate and regular rhythm.     Heart sounds: Normal heart sounds. No murmur heard.   No friction rub. No gallop.  Pulmonary:     Effort: Pulmonary effort is normal. No tachypnea or respiratory distress.     Breath sounds: Normal breath sounds. No decreased breath sounds, wheezing, rhonchi or rales.   Chest:     Chest wall: No tenderness.  Abdominal:     General: Bowel sounds are normal.     Palpations: Abdomen is soft.  Musculoskeletal:        General: Normal range of motion.     Cervical back: Normal range of motion.  Skin:    General: Skin is warm and dry.  Neurological:     Mental Status: She is alert and oriented to person, place, and time.     Coordination: Coordination normal.  Psychiatric:        Behavior: Behavior normal. Behavior is cooperative.        Thought Content: Thought content normal.  Judgment: Judgment normal.         Patient has been counseled extensively about nutrition and exercise as well as the importance of adherence with medications and regular follow-up. The patient was given clear instructions to go to ER or return to medical center if symptoms don't improve, worsen or new problems develop. The patient verbalized understanding.   Follow-up: No follow-ups on file.   [name], FNP-BC [facility] and [facility] [place], [place]   06/29/2021, 10:01 AM

## 2021-06-29 NOTE — Progress Notes (Signed)
[name] [id number]

## 2021-06-30 LAB — CBC WITH DIFFERENTIAL/PLATELET
Basophils Absolute: 0 10*3/uL (ref 0.0–0.2)
Basos: 1 %
EOS (ABSOLUTE): 0.5 10*3/uL — ABNORMAL HIGH (ref 0.0–0.4)
Eos: 7 %
Hematocrit: 38.1 % (ref 34.0–46.6)
Hemoglobin: 11.8 g/dL (ref 11.1–15.9)
Immature Grans (Abs): 0 10*3/uL (ref 0.0–0.1)
Immature Granulocytes: 1 %
Lymphocytes Absolute: 0.9 10*3/uL (ref 0.7–3.1)
Lymphs: 14 %
MCH: 24.8 pg — ABNORMAL LOW (ref 26.6–33.0)
MCHC: 31 g/dL — ABNORMAL LOW (ref 31.5–35.7)
MCV: 80 fL (ref 79–97)
Monocytes Absolute: 0.5 10*3/uL (ref 0.1–0.9)
Monocytes: 8 %
Neutrophils Absolute: 4.5 10*3/uL (ref 1.4–7.0)
Neutrophils: 69 %
Platelets: 361 10*3/uL (ref 150–450)
RBC: 4.75 x10E6/uL (ref 3.77–5.28)
RDW: 13.1 % (ref 11.7–15.4)
WBC: 6.5 10*3/uL (ref 3.4–10.8)

## 2021-06-30 LAB — CMP14+EGFR
ALT: 12 IU/L (ref 0–32)
AST: 20 IU/L (ref 0–40)
Albumin/Globulin Ratio: 1.3 (ref 1.2–2.2)
Albumin: 4.3 g/dL (ref 3.8–4.8)
Alkaline Phosphatase: 141 IU/L — ABNORMAL HIGH (ref 44–121)
BUN/Creatinine Ratio: 24 (ref 12–28)
BUN: 16 mg/dL (ref 8–27)
Bilirubin Total: 0.6 mg/dL (ref 0.0–1.2)
CO2: 23 mmol/L (ref 20–29)
Calcium: 8.8 mg/dL (ref 8.7–10.3)
Chloride: 101 mmol/L (ref 96–106)
Creatinine, Ser: 0.67 mg/dL (ref 0.57–1.00)
Globulin, Total: 3.4 g/dL (ref 1.5–4.5)
Glucose: 89 mg/dL (ref 65–99)
Potassium: 4.6 mmol/L (ref 3.5–5.2)
Sodium: 137 mmol/L (ref 134–144)
Total Protein: 7.7 g/dL (ref 6.0–8.5)
eGFR: 94 mL/min/{1.73_m2} (ref >59)

## 2021-06-30 LAB — VITAMIN D 25 HYDROXY (VIT D DEFICIENCY, FRACTURES): Vit D, 25-Hydroxy: 36 ng/mL (ref 30.0–100.0)

## 2021-06-30 LAB — LIPID PANEL
Chol/HDL Ratio: 3.6 ratio (ref 0.0–4.4)
Cholesterol, Total: 137 mg/dL (ref 100–199)
HDL: 38 mg/dL — ABNORMAL LOW (ref >39)
LDL Chol Calc (NIH): 46 mg/dL (ref 0–99)
Triglycerides: 358 mg/dL — ABNORMAL HIGH (ref 0–149)
VLDL Cholesterol Cal: 53 mg/dL — ABNORMAL HIGH (ref 5–40)

## 2021-07-04 ENCOUNTER — Other Ambulatory Visit: Payer: Self-pay | Admitting: Nurse Practitioner

## 2021-07-04 MED ORDER — OMEGA-3-ACID ETHYL ESTERS 1 G PO CAPS
1.0000 g | ORAL_CAPSULE | Freq: Two times a day (BID) | ORAL | 3 refills | Status: DC
  Filled 2021-07-04: qty 60, 30d supply, fill #0

## 2021-07-05 ENCOUNTER — Other Ambulatory Visit: Payer: Self-pay

## 2021-07-12 ENCOUNTER — Other Ambulatory Visit: Payer: Self-pay

## 2021-08-19 ENCOUNTER — Other Ambulatory Visit: Payer: Self-pay

## 2021-09-10 ENCOUNTER — Encounter: Payer: Self-pay | Admitting: Nurse Practitioner

## 2021-09-10 ENCOUNTER — Ambulatory Visit: Payer: Self-pay | Attending: Nurse Practitioner | Admitting: Nurse Practitioner

## 2021-09-10 ENCOUNTER — Other Ambulatory Visit: Payer: Self-pay

## 2021-09-10 VITALS — BP 160/73 | HR 65 | Ht 62.0 in | Wt 161.0 lb

## 2021-09-10 DIAGNOSIS — E785 Hyperlipidemia, unspecified: Secondary | ICD-10-CM

## 2021-09-10 DIAGNOSIS — J069 Acute upper respiratory infection, unspecified: Secondary | ICD-10-CM

## 2021-09-10 DIAGNOSIS — B9689 Other specified bacterial agents as the cause of diseases classified elsewhere: Secondary | ICD-10-CM

## 2021-09-10 DIAGNOSIS — I10 Essential (primary) hypertension: Secondary | ICD-10-CM

## 2021-09-10 DIAGNOSIS — K219 Gastro-esophageal reflux disease without esophagitis: Secondary | ICD-10-CM

## 2021-09-10 MED ORDER — OMEGA-3-ACID ETHYL ESTERS 1 G PO CAPS
1.0000 g | ORAL_CAPSULE | Freq: Two times a day (BID) | ORAL | 3 refills | Status: DC
  Filled 2021-09-10 – 2022-01-07 (×2): qty 60, 30d supply, fill #0
  Filled 2022-05-10: qty 60, 30d supply, fill #1

## 2021-09-10 MED ORDER — PREDNISONE 10 MG PO TABS
10.0000 mg | ORAL_TABLET | Freq: Every day | ORAL | 0 refills | Status: AC
  Filled 2021-09-10: qty 5, 5d supply, fill #0

## 2021-09-10 MED ORDER — OMEPRAZOLE 20 MG PO CPDR
DELAYED_RELEASE_CAPSULE | Freq: Every day | ORAL | 1 refills | Status: DC
  Filled 2021-09-10 – 2022-01-07 (×2): qty 30, 30d supply, fill #0
  Filled 2022-03-11: qty 30, 30d supply, fill #1
  Filled 2022-05-10: qty 30, 30d supply, fill #2
  Filled 2022-09-06: qty 30, 30d supply, fill #3

## 2021-09-10 MED ORDER — AZITHROMYCIN 250 MG PO TABS
ORAL_TABLET | ORAL | 0 refills | Status: AC
  Filled 2021-09-10: qty 6, 5d supply, fill #0

## 2021-09-10 MED ORDER — LOSARTAN POTASSIUM 100 MG PO TABS
ORAL_TABLET | Freq: Every day | ORAL | 1 refills | Status: DC
  Filled 2021-09-10 – 2021-11-08 (×3): qty 30, 30d supply, fill #0
  Filled 2021-12-06: qty 30, 30d supply, fill #1
  Filled 2022-01-07: qty 30, 30d supply, fill #2
  Filled 2022-02-07: qty 30, 30d supply, fill #3
  Filled 2022-03-11: qty 30, 30d supply, fill #4

## 2021-09-10 MED ORDER — ROSUVASTATIN CALCIUM 20 MG PO TABS
ORAL_TABLET | Freq: Every day | ORAL | 3 refills | Status: DC
  Filled 2021-09-10 – 2022-01-07 (×2): qty 30, 30d supply, fill #0
  Filled 2022-05-10: qty 30, 30d supply, fill #1

## 2021-09-10 NOTE — Progress Notes (Signed)
Cory Roughen [id number]

## 2021-09-10 NOTE — Progress Notes (Signed)
Assessment & Plan:  [name] was seen today for hypertension.  Diagnoses and all orders for this visit:  Primary hypertension -     losartan (COZAAR) 100 MG tablet; TAKE 1 TABLET (100 MG TOTAL) BY MOUTH DAILY. Continue all antihypertensives as prescribed.  Remember to bring in your blood pressure log with you for your follow up appointment.  DASH/Mediterranean Diets are healthier choices for HTN.    Gastroesophageal reflux disease without esophagitis -     omeprazole (PRILOSEC) 20 MG capsule; TAKE 1 TABLET (20 MG TOTAL) BY MOUTH DAILY. INSTRUCTIONS: Avoid GERD Triggers: acidic, spicy or fried foods, caffeine, coffee, sodas,  alcohol and chocolate.    Hyperlipidemia, unspecified hyperlipidemia type -     omega-3 acid ethyl esters (LOVAZA) 1 g capsule; Take 1 capsule (1 g total) by mouth 2 (two) times daily. -     rosuvastatin (CRESTOR) 20 MG tablet; TAKE 1 TABLET (20 MG TOTAL) BY MOUTH DAILY.  Bacterial URI -     azithromycin (ZITHROMAX) 250 MG tablet; Take 2 tablets on day 1, then 1 tablet daily on days 2 through 5 -     predniSONE (DELTASONE) 10 MG tablet; Take 1 tablet (10 mg total) by mouth daily with breakfast for 5 days.   Patient has been counseled on age-appropriate routine health concerns for screening and prevention. These are reviewed and up-to-date. Referrals have been placed accordingly. Immunizations are up-to-date or declined.    Subjective:   Chief Complaint  Patient presents with   Hypertension    Maria Dominguez 70 y.o. female presents to office today for follow up to HTN.   VRI was used to communicate directly with patient for the entire encounter including providing detailed patient instructions.   She is also accompanied by her daughter today who is also providing some of her pertinent   She  has a past medical history of Diverticulitis of colon with perforation, Fatty liver, breast cancer, Hyperlipidemia, Hypertension, and Perforated gastric ulcer ([place]).     HTN Blood pressure persistently elevated at her office visits however she reports normal readings at home.  130-140/70s.  She is taking losartan 100 mg daily as prescribed BP Readings from Last 3 Encounters:  09/10/21 (!) 160/73  06/29/21 (!) 157/80  03/31/21 (!) 156/72     Prediabetes Well controlled without the use of any oral diabetic medications Lab Results  Component Value Date   HGBA1C 5.8 (A) 06/29/2021     Upper Respiratory Infection: Patient complains of symptoms of a URI. Symptoms include congestion, cough, and fever . Onset of symptoms was 8 days ago, unchanged since that time. She also c/o fever with T-max 101.6  . Evaluation to date: none. Treatment to date: none. She does have a new dog but has had at least 3 months   Review of Systems  Constitutional:  Positive for fever. Negative for malaise/fatigue and weight loss.  HENT: Negative.  Negative for nosebleeds.   Eyes: Negative.  Negative for blurred vision, double vision and photophobia.  Respiratory:  Positive for cough. Negative for shortness of breath.   Cardiovascular: Negative.  Negative for chest pain, palpitations and leg swelling.  Gastrointestinal:  Positive for heartburn. Negative for nausea and vomiting.  Musculoskeletal: Negative.  Negative for myalgias.  Neurological: Negative.  Negative for dizziness, focal weakness, seizures and headaches.  Psychiatric/Behavioral: Negative.  Negative for suicidal ideas.    Past Medical History:  Diagnosis Date   Diverticulitis of colon with perforation    Fatty liver  Hx of breast cancer    Hyperlipidemia    Hypertension    Perforated gastric ulcer ([place])     Past Surgical History:  Procedure Laterality Date   HUMERUS FRACTURE SURGERY     left lumpectomy     MASS EXCISION Left    left axilla    Family History  Problem Relation Age of Onset   Diabetes Mother    Breast cancer Sister    Pancreatic cancer Cousin    Pancreatic cancer Cousin    Colon  cancer Neg Hx    Esophageal cancer Neg Hx    Rectal cancer Neg Hx    Stomach cancer Neg Hx     Social History Reviewed with no changes to be made today.   Outpatient Medications Prior to Visit  Medication Sig Dispense Refill   cholecalciferol (VITAMIN D3) 25 MCG (1000 UNIT) tablet Take 1 tablet (1,000 Units total) by mouth daily. 90 tablet 1   meloxicam (MOBIC) 7.5 MG tablet Take 1 tablet (7.5 mg total) by mouth daily. 30 tablet 1   losartan (COZAAR) 100 MG tablet TAKE 1 TABLET (100 MG TOTAL) BY MOUTH DAILY. 90 tablet 1   omeprazole (PRILOSEC) 20 MG capsule TAKE 1 TABLET (20 MG TOTAL) BY MOUTH DAILY. 90 capsule 1   rosuvastatin (CRESTOR) 20 MG tablet TAKE 1 TABLET (20 MG TOTAL) BY MOUTH DAILY. 90 tablet 3   omega-3 acid ethyl esters (LOVAZA) 1 g capsule Take 1 capsule (1 g total) by mouth 2 (two) times daily. 60 capsule 3   Facility-Administered Medications Prior to Visit  Medication Dose Route Frequency Provider Last Rate Last Admin   bupivacaine liposome (EXPAREL) 1.3 % injection 266 mg  20 mL Infiltration Once Stark Klein, MD        No Known Allergies     Objective:    BP (!) 160/73   Pulse 65   Ht 5\' 2"  (1.575 m)   Wt 161 lb (73 kg)   SpO2 98%   BMI 29.45 kg/m  Wt Readings from Last 3 Encounters:  09/10/21 161 lb (73 kg)  06/29/21 161 lb 8 oz (73.3 kg)  03/31/21 144 lb (65.3 kg)    Physical Exam Vitals and nursing note reviewed.  Constitutional:      Appearance: She is well-developed.  HENT:     Head: Normocephalic and atraumatic.  Cardiovascular:     Rate and Rhythm: Normal rate and regular rhythm.     Heart sounds: Normal heart sounds. No murmur heard.   No friction rub. No gallop.  Pulmonary:     Effort: Pulmonary effort is normal. No tachypnea or respiratory distress.     Breath sounds: Normal breath sounds. No decreased breath sounds, wheezing, rhonchi or rales.  Chest:     Chest wall: No tenderness.  Abdominal:     General: Bowel sounds are normal.      Palpations: Abdomen is soft.  Musculoskeletal:        General: Normal range of motion.     Cervical back: Normal range of motion.  Skin:    General: Skin is warm and dry.  Neurological:     Mental Status: She is alert and oriented to person, place, and time.     Coordination: Coordination normal.  Psychiatric:        Behavior: Behavior normal. Behavior is cooperative.        Thought Content: Thought content normal.        Judgment: Judgment normal.  Patient has been counseled extensively about nutrition and exercise as well as the importance of adherence with medications and regular follow-up. The patient was given clear instructions to go to ER or return to medical center if symptoms don't improve, worsen or new problems develop. The patient verbalized understanding.   Follow-up: Return for TELE TUESDAY 2 weeks for cough. Labs any day after December 20th.   [name], FNP-BC [facility] and [facility] [place], [place]   09/10/2021, 9:18 AM

## 2021-09-21 ENCOUNTER — Encounter: Payer: Self-pay | Admitting: Nurse Practitioner

## 2021-09-21 ENCOUNTER — Ambulatory Visit (HOSPITAL_BASED_OUTPATIENT_CLINIC_OR_DEPARTMENT_OTHER): Payer: Self-pay | Admitting: Nurse Practitioner

## 2021-09-21 DIAGNOSIS — J069 Acute upper respiratory infection, unspecified: Secondary | ICD-10-CM

## 2021-09-21 DIAGNOSIS — B9689 Other specified bacterial agents as the cause of diseases classified elsewhere: Secondary | ICD-10-CM

## 2021-09-21 NOTE — Progress Notes (Addendum)
Virtual Visit via Telephone Note Due to national recommendations of social distancing due to [place] 19, telehealth visit is felt to be most appropriate for this patient at this time.  I discussed the limitations, risks, security and privacy concerns of performing an evaluation and management service by telephone and the availability of in person appointments. I also discussed with the patient that there may be a patient responsible charge related to this service. The patient expressed understanding and agreed to proceed.    I connected with Maria Dominguez on 09/21/21  at   3:30 PM EST  EDT by telephone and verified that I am speaking with the correct person using two identifiers.  Location of Patient: Private Residence   Location of Provider: [place] in Telemedicine visit: Geryl Rankins FNP-BC [facility] INTERPRETER [phone]    History of Present Illness: Telemedicine visit for: COUGH  She was prescribed prednisone and zpak for cough a few weeks ago for bacterial URI. Today she endorses complete resolution of symptoms. Denies congestion, cough, and fever       Past Medical History:  Diagnosis Date   Diverticulitis of colon with perforation    Fatty liver    Hx of breast cancer    Hyperlipidemia    Hypertension    Perforated gastric ulcer ([place])     Past Surgical History:  Procedure Laterality Date   HUMERUS FRACTURE SURGERY     left lumpectomy     MASS EXCISION Left    left axilla    Family History  Problem Relation Age of Onset   Diabetes Mother    Breast cancer Sister    Pancreatic cancer Cousin    Pancreatic cancer Cousin    Colon cancer Neg Hx    Esophageal cancer Neg Hx    Rectal cancer Neg Hx    Stomach cancer Neg Hx     Social History   Socioeconomic History   Marital status: Single    Spouse name: Not on file   Number of children: 2   Years of education: Not on file   Highest  education level: 10th grade  Occupational History   Not on file  Tobacco Use   Smoking status: Former    Types: Cigarettes   Smokeless tobacco: Never  Vaping Use   Vaping Use: Never used  Substance and Sexual Activity   Alcohol use: Not Currently   Drug use: No   Sexual activity: Not Currently  Other Topics Concern   Not on file  Social History Narrative   Not on file   Social Determinants of Health   Financial Resource Strain: Not on file  Food Insecurity: Not on file  Transportation Needs: Not on file  Physical Activity: Not on file  Stress: Not on file  Social Connections: Not on file     Observations/Objective: Awake, alert and oriented x 3   Review of Systems  Constitutional:  Negative for fever, malaise/fatigue and weight loss.  HENT: Negative.  Negative for nosebleeds.   Eyes: Negative.  Negative for blurred vision, double vision and photophobia.  Respiratory: Negative.  Negative for cough and shortness of breath.   Cardiovascular: Negative.  Negative for chest pain, palpitations and leg swelling.  Gastrointestinal: Negative.  Negative for heartburn, nausea and vomiting.  Musculoskeletal: Negative.  Negative for myalgias.  Neurological: Negative.  Negative for dizziness, focal weakness, seizures and headaches.  Psychiatric/Behavioral: Negative.  Negative for suicidal ideas.  Assessment and Plan: Diagnoses and all orders for this visit:  Bacterial URI SYMPTOMS RESOLVED  Follow Up Instructions Return if symptoms worsen or fail to improve.     I discussed the assessment and treatment plan with the patient. The patient was provided an opportunity to ask questions and all were answered. The patient agreed with the plan and demonstrated an understanding of the instructions.   The patient was advised to call back or seek an in-person evaluation if the symptoms worsen or if the condition fails to improve as anticipated.  I provided 10 minutes of  non-face-to-face time during this encounter including median intraservice time, reviewing previous notes, labs, imaging, medications and explaining diagnosis and management.  [name], FNP-BC

## 2021-09-28 ENCOUNTER — Other Ambulatory Visit: Payer: Self-pay

## 2021-09-28 ENCOUNTER — Ambulatory Visit: Payer: Self-pay | Attending: Nurse Practitioner

## 2021-09-28 DIAGNOSIS — D72829 Elevated white blood cell count, unspecified: Secondary | ICD-10-CM

## 2021-09-28 DIAGNOSIS — I10 Essential (primary) hypertension: Secondary | ICD-10-CM

## 2021-09-28 DIAGNOSIS — E785 Hyperlipidemia, unspecified: Secondary | ICD-10-CM

## 2021-09-28 DIAGNOSIS — E559 Vitamin D deficiency, unspecified: Secondary | ICD-10-CM

## 2021-09-29 LAB — CBC WITH DIFFERENTIAL/PLATELET
Basophils Absolute: 0 10*3/uL (ref 0.0–0.2)
Basos: 0 %
EOS (ABSOLUTE): 0.4 10*3/uL (ref 0.0–0.4)
Eos: 7 %
Hematocrit: 35.2 % (ref 34.0–46.6)
Hemoglobin: 11.3 g/dL (ref 11.1–15.9)
Immature Grans (Abs): 0 10*3/uL (ref 0.0–0.1)
Immature Granulocytes: 1 %
Lymphocytes Absolute: 1 10*3/uL (ref 0.7–3.1)
Lymphs: 20 %
MCH: 26 pg — ABNORMAL LOW (ref 26.6–33.0)
MCHC: 32.1 g/dL (ref 31.5–35.7)
MCV: 81 fL (ref 79–97)
Monocytes Absolute: 0.5 10*3/uL (ref 0.1–0.9)
Monocytes: 9 %
Neutrophils Absolute: 3.3 10*3/uL (ref 1.4–7.0)
Neutrophils: 63 %
Platelets: 356 10*3/uL (ref 150–450)
RBC: 4.34 x10E6/uL (ref 3.77–5.28)
RDW: 13.7 % (ref 11.7–15.4)
WBC: 5.3 10*3/uL (ref 3.4–10.8)

## 2021-09-29 LAB — CMP14+EGFR
ALT: 8 IU/L (ref 0–32)
AST: 14 IU/L (ref 0–40)
Albumin/Globulin Ratio: 1.3 (ref 1.2–2.2)
Albumin: 4.1 g/dL (ref 3.7–4.7)
Alkaline Phosphatase: 130 IU/L — ABNORMAL HIGH (ref 44–121)
BUN/Creatinine Ratio: 18 (ref 12–28)
BUN: 14 mg/dL (ref 8–27)
Bilirubin Total: 0.4 mg/dL (ref 0.0–1.2)
CO2: 23 mmol/L (ref 20–29)
Calcium: 8.9 mg/dL (ref 8.7–10.3)
Chloride: 105 mmol/L (ref 96–106)
Creatinine, Ser: 0.76 mg/dL (ref 0.57–1.00)
Globulin, Total: 3.2 g/dL (ref 1.5–4.5)
Glucose: 93 mg/dL (ref 70–99)
Potassium: 4.4 mmol/L (ref 3.5–5.2)
Sodium: 140 mmol/L (ref 134–144)
Total Protein: 7.3 g/dL (ref 6.0–8.5)
eGFR: 84 mL/min/{1.73_m2} (ref >59)

## 2021-09-29 LAB — LIPID PANEL
Chol/HDL Ratio: 2.6 ratio (ref 0.0–4.4)
Cholesterol, Total: 99 mg/dL — ABNORMAL LOW (ref 100–199)
HDL: 38 mg/dL — ABNORMAL LOW (ref >39)
LDL Chol Calc (NIH): 29 mg/dL (ref 0–99)
Triglycerides: 201 mg/dL — ABNORMAL HIGH (ref 0–149)
VLDL Cholesterol Cal: 32 mg/dL (ref 5–40)

## 2021-09-29 LAB — VITAMIN D 25 HYDROXY (VIT D DEFICIENCY, FRACTURES): Vit D, 25-Hydroxy: 45.6 ng/mL (ref 30.0–100.0)

## 2021-11-08 ENCOUNTER — Other Ambulatory Visit (HOSPITAL_COMMUNITY): Payer: Self-pay

## 2021-11-08 ENCOUNTER — Other Ambulatory Visit: Payer: Self-pay

## 2021-12-06 ENCOUNTER — Other Ambulatory Visit: Payer: Self-pay

## 2021-12-07 ENCOUNTER — Other Ambulatory Visit: Payer: Self-pay

## 2022-01-06 ENCOUNTER — Other Ambulatory Visit: Payer: Self-pay | Admitting: Obstetrics and Gynecology

## 2022-01-06 DIAGNOSIS — Z1231 Encounter for screening mammogram for malignant neoplasm of breast: Secondary | ICD-10-CM

## 2022-01-07 ENCOUNTER — Other Ambulatory Visit: Payer: Self-pay

## 2022-01-14 ENCOUNTER — Other Ambulatory Visit (HOSPITAL_COMMUNITY): Payer: Self-pay

## 2022-01-27 ENCOUNTER — Ambulatory Visit
Admission: RE | Admit: 2022-01-27 | Discharge: 2022-01-27 | Disposition: A | Discharge status: Home / Self Care | Payer: Self-pay | Source: Ambulatory Visit | Attending: Obstetrics and Gynecology | Admitting: Obstetrics and Gynecology

## 2022-01-27 DIAGNOSIS — Z1231 Encounter for screening mammogram for malignant neoplasm of breast: Secondary | ICD-10-CM

## 2022-01-31 ENCOUNTER — Other Ambulatory Visit: Payer: Self-pay | Admitting: Obstetrics and Gynecology

## 2022-01-31 DIAGNOSIS — R928 Other abnormal and inconclusive findings on diagnostic imaging of breast: Secondary | ICD-10-CM

## 2022-02-07 ENCOUNTER — Other Ambulatory Visit: Payer: Self-pay

## 2022-02-24 ENCOUNTER — Ambulatory Visit
Admission: RE | Admit: 2022-02-24 | Discharge: 2022-02-24 | Disposition: A | Discharge status: Home / Self Care | Payer: No Typology Code available for payment source | Source: Ambulatory Visit | Attending: Obstetrics and Gynecology | Admitting: Obstetrics and Gynecology

## 2022-02-24 ENCOUNTER — Other Ambulatory Visit: Payer: Self-pay | Admitting: Obstetrics and Gynecology

## 2022-02-24 DIAGNOSIS — R928 Other abnormal and inconclusive findings on diagnostic imaging of breast: Secondary | ICD-10-CM

## 2022-02-24 DIAGNOSIS — N6310 Unspecified lump in the right breast, unspecified quadrant: Secondary | ICD-10-CM

## 2022-02-25 ENCOUNTER — Ambulatory Visit: Payer: No Typology Code available for payment source

## 2022-03-11 ENCOUNTER — Other Ambulatory Visit: Payer: Self-pay

## 2022-03-15 ENCOUNTER — Ambulatory Visit: Payer: Self-pay | Admitting: *Deleted

## 2022-03-15 VITALS — BP 170/92 | Wt 155.8 lb

## 2022-03-15 DIAGNOSIS — Z1211 Encounter for screening for malignant neoplasm of colon: Secondary | ICD-10-CM

## 2022-03-15 DIAGNOSIS — Z1239 Encounter for other screening for malignant neoplasm of breast: Secondary | ICD-10-CM

## 2022-03-15 NOTE — Progress Notes (Addendum)
[name] is a 71 y.o. female who presents to [facility] clinic today with no complaints. Patient had a screening mammogram completed 01/27/2022 that additional imaging of both breasts was recommended for follow up. Diagnostic mammogram and ultrasound completed 02/24/2022 for follow up that a right breast ultrasound guided biopsy is recommended for follow up.   Pap Smear: Pap smear not completed today. Last Pap smear was in 2021 at a clinic in [place], [place] and was normal per patient. Per patient has no history of an abnormal Pap smear. Last Pap smear result is not available in Epic.   Physical exam: Breasts Right breast slightly larger than left breast due to patient has a history of a left breast lumpectomy 10 years ago for breast cancer. Right nipple dry and scaly appearing on exam. Patient stated she used some vinegar on her right breast. Observed a scar on the upper outer left breast from history of lumpectomy. No nipple retraction bilateral breasts. No nipple discharge bilateral breasts. No lymphadenopathy. No lumps palpated bilateral breasts. No complaints of pain or tenderness on exam.  MS DIGITAL SCREENING TOMO BILATERAL  Result Date: 01/28/2022 CLINICAL DATA:  Screening. EXAM: DIGITAL SCREENING BILATERAL MAMMOGRAM WITH TOMOSYNTHESIS AND CAD TECHNIQUE: Bilateral screening digital craniocaudal and mediolateral oblique mammograms were obtained. Bilateral screening digital breast tomosynthesis was performed. The images were evaluated with computer-aided detection. COMPARISON:  Previous exam(s). ACR Breast Density Category b: There are scattered areas of fibroglandular density. FINDINGS: In the right breast possible mass requires further evaluation. In the left breast possible masses requires further evaluation. IMPRESSION: Further evaluation is suggested for possible mass in the right breast. Further evaluation is suggested for possible masses in the left breast. RECOMMENDATION: Diagnostic mammogram  and possibly ultrasound of both breasts. (Code:FI-B-8M) The patient will be contacted regarding the findings, and additional imaging will be scheduled. BI-RADS CATEGORY  0: Incomplete. Need additional imaging evaluation and/or prior mammograms for comparison. Electronically Signed   By: Abelardo Diesel M.D.   On: 01/28/2022 09:23   MS DIGITAL DIAG TOMO BILAT  Result Date: 02/24/2022 CLINICAL DATA:  71 year old female recalled from screening mammogram dated 01/27/2022 for a possible right breast mass and possible left breast masses. EXAM: DIGITAL DIAGNOSTIC BILATERAL MAMMOGRAM WITH TOMOSYNTHESIS AND CAD; ULTRASOUND LEFT BREAST LIMITED; ULTRASOUND RIGHT BREAST LIMITED TECHNIQUE: Bilateral digital diagnostic mammography and breast tomosynthesis was performed. The images were evaluated with computer-aided detection.; Targeted ultrasound examination of the left breast was performed.; Targeted ultrasound examination of the right breast was performed COMPARISON:  None available. ACR Breast Density Category c: The breast tissue is heterogeneously dense, which may obscure small masses. FINDINGS: There is a persistent oval, spiculated hyperdense mass in the upper outer right breast at posterior depth. Circumscribed masses in the deep central left breast are smaller on today's additional views, similar to multiple prior studies. Targeted ultrasound is performed, showing an oval, circumscribed hypoechoic mass with associated vascularity at the 9:30 position 10 cm from the nipple. It measures 1.2 x 1.0 x 0.9 cm. This corresponds with the mammographic finding. Evaluation of the right axilla demonstrates no suspicious findings. Evaluation of the upper outer left breast demonstrates scattered fibrocystic changes without focal or suspicious sonographic abnormality. IMPRESSION: 1. Indeterminate right breast mass at the 9:30 position. Recommendation is for ultrasound-guided biopsy. 2. No suspicious right axillary lymphadenopathy.  3. No suspicious mammographic or sonographic findings on the left. RECOMMENDATION: Ultrasound-guided biopsy of the right breast. I have discussed the findings and recommendations with the patient. If applicable,  a reminder letter will be sent to the patient regarding the next appointment. BI-RADS CATEGORY  4: Suspicious. Electronically Signed   By: Kristopher Oppenheim M.D.   On: 02/24/2022 10:22  MS DIGITAL DIAG TOMO BILAT  Result Date: 03/14/2019 CLINICAL DATA:  71 year old female with bilateral breast pain. History of unknown LEFT breast surgery with possible tamoxifen treatment. EXAM: DIGITAL DIAGNOSTIC BILATERAL MAMMOGRAM WITH CAD AND TOMO ULTRASOUND BILATERAL BREAST COMPARISON:  None. Prior mammograms from [place] could not be obtained. ACR Breast Density Category b: There are scattered areas of fibroglandular density. FINDINGS: 2D/3D full field views of both breasts demonstrate several circumscribed oval masses within the central breasts and INNER LEFT breast. No other suspicious mass, nonsurgical distortion or worrisome calcifications noted. Mammographic images were processed with CAD. Targeted ultrasound is performed, showing multiple benign simple and minimally complicated cysts within both breasts corresponding to the mammographic findings. The largest cysts measure 9 mm at the 3 o'clock position of the RIGHT breast 4 cm from the nipple and 1.1 cm at the 9 o'clock position of the LEFT breast 1 cm from the nipple. IMPRESSION: 1. Bilateral benign breast cysts. 2. No evidence of breast malignancy. RECOMMENDATION: Bilateral screening mammogram in 1 year I have discussed the findings and recommendations with the patient through an interpreter. If applicable, a reminder letter will be sent to the patient regarding the next appointment. BI-RADS CATEGORY  2: Benign. Electronically Signed   By: Margarette Canada M.D.   On: 03/14/2019 14:41     Pelvic/Bimanual Pap is not indicated today per BCCCP guidelines.   Smoking  History: Patient is a former smoker that quit 32 years ago.   Patient Navigation: Patient education provided. Access to services provided for patient through [employer] program. Spanish interpreter Rudene Anda from [facility] provided. Patient has food insecurities. Escorted patient to the food market at the [employer] for groceries.  Colorectal Cancer Screening: Per patient has had colonoscopy completed on 02/03/2020. Patient completed a FIT Test 01/15/2019 that was negative. FIT Test given to patient to complete.  No complaints today.    Breast and Cervical Cancer Risk Assessment: Patient has family history of her twin sister having breast cancer. Patient has a personal history of breast cancer. Patient has no known genetic mutations or history of radiation treatment to the chest before age 27. Patient does not have history of cervical dysplasia, immunocompromised, or DES exposure in-utero.  Risk Assessment     Risk Scores       03/15/2022 03/14/2019   Last edited by: Royston Bake, CMA Rolena Infante H, LPN   5-year risk: 3.1 % 3 %   Lifetime risk: 8.5 % 9.8 %            A: BCCCP exam without pap smear No complaints.  P: Referred patient to the [facility] [address] for a right breast biopsy per recommendation. Appointment scheduled Wednesday, March 16, 2022 at 1245.  Loletta Parish, RN 03/15/2022 1:30 PM

## 2022-03-15 NOTE — Patient Instructions (Signed)
Explained breast self awareness with Maria Dominguez. Patient did not need a Pap smear today due to last Pap smear was in 2021 per patient. Let her know that since she is over 71 years old with no history of an abnormal Pap smear that she will need to discuss with her PCP or OBGYN if she needs any further Pap smears. Referred patient to the [facility] for a right breast biopsy per recommendation. Appointment scheduled Wednesday, March 16, 2022 at 1245. Patient aware of appointment and will be there. Maria Dominguez verbalized understanding.  Tahj Lindseth, Arvil Chaco, RN 1:30 PM

## 2022-03-16 ENCOUNTER — Ambulatory Visit
Admission: RE | Admit: 2022-03-16 | Discharge: 2022-03-16 | Disposition: A | Discharge status: Home / Self Care | Payer: No Typology Code available for payment source | Source: Ambulatory Visit | Attending: Obstetrics and Gynecology | Admitting: Obstetrics and Gynecology

## 2022-03-16 DIAGNOSIS — N6310 Unspecified lump in the right breast, unspecified quadrant: Secondary | ICD-10-CM

## 2022-03-16 HISTORY — PX: BREAST BIOPSY: SHX20

## 2022-03-29 ENCOUNTER — Ambulatory Visit: Payer: Self-pay | Admitting: Surgery

## 2022-03-29 DIAGNOSIS — N6311 Unspecified lump in the right breast, upper outer quadrant: Secondary | ICD-10-CM

## 2022-03-29 NOTE — H&P (View-Only) (Signed)
Expand All Collapse All      REFERRING PHYSICIAN:  Constant, Peggy, MD   PROVIDER:  Carlean Jews, MD   MRN: [id number] DOB: [birth year] DATE OF ENCOUNTER: 03/29/2022   Subjective    Chief Complaint: Breast Mass       History of Present Illness: Maria Dominguez is a 71 y.o. female who is seen today as an office consultation at the request of Dr. Elly Modena for evaluation of Breast Mass .     This is a 71 year old female with a history of left breast cancer treated with lumpectomy and tamoxifen in 2010 who presents after recent screening mammogram revealed suspicious findings in both breasts.  Further work-up showed no sign of a left breast mass.  However the right breast showed indeterminate mass located at 930, 10 cm from the nipple.  This measures 1.2 x 1.0 x 0.9 cm.  This area was biopsied under ultrasound guidance.  Biopsy revealed atypical papillary proliferation.  The mass is concerning as the differential diagnosis includes papillary cancer.  She is referred to [place] for surgery.     Review of Systems: A complete review of systems was obtained from the patient.  I have reviewed this information and discussed as appropriate with the patient.  See HPI as well for other ROS.   Review of Systems  Constitutional: Negative.   HENT: Negative.    Eyes: Negative.   Respiratory: Negative.    Cardiovascular: Negative.   Gastrointestinal: Negative.   Genitourinary: Negative.   Musculoskeletal: Negative.   Skin: Negative.   Neurological: Negative.   Endo/Heme/Allergies: Negative.   Psychiatric/Behavioral: Negative.         Medical History: Past Medical History      Past Medical History:  Diagnosis Date   History of cancer     Hypertension             Patient Active Problem List  Diagnosis   Mass of upper outer quadrant of right breast   Diverticulitis   Essential hypertension   Hyperlipidemia      Past Surgical History       Past Surgical History:  Procedure  Laterality Date   BREAST EXCISIONAL BIOPSY            Allergies  No Known Allergies           Current Outpatient Medications on File Prior to Visit  Medication Sig Dispense Refill   losartan (COZAAR) 100 MG tablet Take 100 mg by mouth once daily       omeprazole (PRILOSEC) 20 MG DR capsule Take 20 mg by mouth once daily       rosuvastatin (CRESTOR) 20 MG tablet Take 20 mg by mouth once daily        No current facility-administered medications on file prior to visit.      Family History       Family History  Problem Relation Age of Onset   Diabetes Mother     Breast cancer Sister          Social History        Tobacco Use  Smoking Status Former   Types: Cigarettes  Smokeless Tobacco Never      Social History  Social History         Socioeconomic History   Marital status: Single  Tobacco Use   Smoking status: Former      Types: Cigarettes   Smokeless tobacco: Never  Vaping Use   Vaping Use: Never used  Substance and Sexual Activity   Alcohol use: Never   Drug use: Never        Objective:         Vitals:    03/29/22 1604  BP: 124/78  Pulse: 74  Temp: 36.1 C (97 F)  SpO2: 97%  Weight: 71.1 kg (156 lb 12.8 oz)  Height: 148.6 cm (4' 10.5")    Body mass index is 32.21 kg/m.   Physical Exam    Constitutional:  WDWN in NAD, conversant, no obvious deformities; lying in bed comfortably Eyes:  Pupils equal, round; sclera anicteric; moist conjunctiva; no lid lag HENT:  Oral mucosa moist; good dentition  Neck:  No masses palpated, trachea midline; no thyromegaly Lungs:  CTA bilaterally; normal respiratory effort Breasts:  symmetric, no nipple changes; no palpable masses or lymphadenopathy on either side; healed left axillary and breast incisions CV:  Regular rate and rhythm; no murmurs; extremities well-perfused with no edema Abd:  +bowel sounds, soft, non-tender, no palpable organomegaly; no palpable hernias Musc:  Unable to assess gait; no  apparent clubbing or cyanosis in extremities Lymphatic:  No palpable cervical or axillary lymphadenopathy Skin:  Warm, dry; no sign of jaundice Psychiatric - alert and oriented x 4; calm mood and affect     Labs, Imaging and Diagnostic Testing: Diagnosis Breast, right, needle core biopsy, 9:30, mass, 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 SEE NOTE Diagnosis Note Biopsies show an atypical ductal proliferation with papillary architecture. The proliferation appears well-circumscribed and shows a clear delineation adjacent scant stroma. Immunohistochemical stains for myoepithelial cells (p63, calponin and SMM-1) were attempted and do show significant attenuation at the interface with stroma but it is uncertain if the staining is entirely lost. Differential diagnosis includes an encapsulated papillary carcinoma, papillary ductal carcinoma in situ and solid papillary carcinoma. Dr. Arby Barrette and Dr. Alric Seton have reviewed the case and concur with the above diagnosis. The [facility] was notified on 03/21/2022. Jaquita Folds MD Pathologist, Electronic Signature (Case signed 03/21/2022)   CLINICAL DATA:  71 year old female recalled from screening mammogram dated 01/27/2022 for a possible right breast mass and possible left breast masses.   EXAM: DIGITAL DIAGNOSTIC BILATERAL MAMMOGRAM WITH TOMOSYNTHESIS AND CAD; ULTRASOUND LEFT BREAST LIMITED; ULTRASOUND RIGHT BREAST LIMITED   TECHNIQUE: Bilateral digital diagnostic mammography and breast tomosynthesis was performed. The images were evaluated with computer-aided detection.; Targeted ultrasound examination of the left breast was performed.; Targeted ultrasound examination of the right breast was performed   COMPARISON:  None available.   ACR Breast Density Category c: The breast tissue is heterogeneously dense, which may obscure small masses.   FINDINGS: There is a persistent oval,  spiculated hyperdense mass in the upper outer right breast at posterior depth. Circumscribed masses in the deep central left breast are smaller on today's additional views, similar to multiple prior studies.   Targeted ultrasound is performed, showing an oval, circumscribed hypoechoic mass with associated vascularity at the 9:30 position 10 cm from the nipple. It measures 1.2 x 1.0 x 0.9 cm. This corresponds with the mammographic finding. Evaluation of the right axilla demonstrates no suspicious findings. Evaluation of the upper outer left breast demonstrates scattered fibrocystic changes without focal or suspicious sonographic abnormality.   IMPRESSION: 1. Indeterminate right breast mass at the 9:30 position. Recommendation is for ultrasound-guided biopsy. 2. No suspicious right axillary lymphadenopathy. 3. No suspicious mammographic or sonographic findings on the left.   RECOMMENDATION: Ultrasound-guided biopsy of the right breast.   I have  discussed the findings and recommendations with the patient. If applicable, a reminder letter will be sent to the patient regarding the next appointment.   BI-RADS CATEGORY  4: Suspicious.     Electronically Signed   By: Kristopher Oppenheim M.D.   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   Right radioactive seed localized lumpectomy.The surgical procedure has been discussed with the patient.  Potential risks, benefits, alternative treatments, and expected outcomes have been explained.  All of the patient's questions at this time have been answered.  The likelihood of reaching the patient's treatment goal is good.  The patient understand the proposed surgical procedure and wishes to proceed.     No follow-ups on file.   Coreena Rubalcava Jearld Adjutant, MD  03/29/2022 4:15  PM

## 2022-03-29 NOTE — H&P (Signed)
Expand All Collapse All      REFERRING PHYSICIAN:  Constant, Peggy, MD   PROVIDER:  Carlean Jews, MD   MRN: [id number] DOB: 12-13-1950 DATE OF ENCOUNTER: 03/29/2022   Subjective    Chief Complaint: Breast Mass       History of Present Illness: Maria Dominguez is a 71 y.o. female who is seen today as an office consultation at the request of Dr. Elly Modena for evaluation of Breast Mass .     This is a 71 year old female with a history of left breast cancer treated with lumpectomy and tamoxifen in 2010 who presents after recent screening mammogram revealed suspicious findings in both breasts.  Further work-up showed no sign of a left breast mass.  However the right breast showed indeterminate mass located at 930, 10 cm from the nipple.  This measures 1.2 x 1.0 x 0.9 cm.  This area was biopsied under ultrasound guidance.  Biopsy revealed atypical papillary proliferation.  The mass is concerning as the differential diagnosis includes papillary cancer.  She is referred to [place] for surgery.     Review of Systems: A complete review of systems was obtained from the patient.  I have reviewed this information and discussed as appropriate with the patient.  See HPI as well for other ROS.   Review of Systems  Constitutional: Negative.   HENT: Negative.    Eyes: Negative.   Respiratory: Negative.    Cardiovascular: Negative.   Gastrointestinal: Negative.   Genitourinary: Negative.   Musculoskeletal: Negative.   Skin: Negative.   Neurological: Negative.   Endo/Heme/Allergies: Negative.   Psychiatric/Behavioral: Negative.         Medical History: Past Medical History      Past Medical History:  Diagnosis Date   History of cancer     Hypertension             Patient Active Problem List  Diagnosis   Mass of upper outer quadrant of right breast   Diverticulitis   Essential hypertension   Hyperlipidemia      Past Surgical History       Past Surgical History:  Procedure  Laterality Date   BREAST EXCISIONAL BIOPSY            Allergies  No Known Allergies           Current Outpatient Medications on File Prior to Visit  Medication Sig Dispense Refill   losartan (COZAAR) 100 MG tablet Take 100 mg by mouth once daily       omeprazole (PRILOSEC) 20 MG DR capsule Take 20 mg by mouth once daily       rosuvastatin (CRESTOR) 20 MG tablet Take 20 mg by mouth once daily        No current facility-administered medications on file prior to visit.      Family History       Family History  Problem Relation Age of Onset   Diabetes Mother     Breast cancer Sister          Social History        Tobacco Use  Smoking Status Former   Types: Cigarettes  Smokeless Tobacco Never      Social History  Social History         Socioeconomic History   Marital status: Single  Tobacco Use   Smoking status: Former      Types: Cigarettes   Smokeless tobacco: Never  Vaping Use   Vaping Use: Never used  Substance and Sexual Activity   Alcohol use: Never   Drug use: Never        Objective:         Vitals:    03/29/22 1604  BP: 124/78  Pulse: 74  Temp: 36.1 C (97 F)  SpO2: 97%  Weight: 71.1 kg (156 lb 12.8 oz)  Height: 148.6 cm (4' 10.5")    Body mass index is 32.21 kg/m.   Physical Exam    Constitutional:  WDWN in NAD, conversant, no obvious deformities; lying in bed comfortably Eyes:  Pupils equal, round; sclera anicteric; moist conjunctiva; no lid lag HENT:  Oral mucosa moist; good dentition  Neck:  No masses palpated, trachea midline; no thyromegaly Lungs:  CTA bilaterally; normal respiratory effort Breasts:  symmetric, no nipple changes; no palpable masses or lymphadenopathy on either side; healed left axillary and breast incisions CV:  Regular rate and rhythm; no murmurs; extremities well-perfused with no edema Abd:  +bowel sounds, soft, non-tender, no palpable organomegaly; no palpable hernias Musc:  Unable to assess gait; no  apparent clubbing or cyanosis in extremities Lymphatic:  No palpable cervical or axillary lymphadenopathy Skin:  Warm, dry; no sign of jaundice Psychiatric - alert and oriented x 4; calm mood and affect     Labs, Imaging and Diagnostic Testing: Diagnosis Breast, right, needle core biopsy, 9:30, mass, 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 SEE NOTE Diagnosis Note Biopsies show an atypical ductal proliferation with papillary architecture. The proliferation appears well-circumscribed and shows a clear delineation adjacent scant stroma. Immunohistochemical stains for myoepithelial cells (p63, calponin and SMM-1) were attempted and do show significant attenuation at the interface with stroma but it is uncertain if the staining is entirely lost. Differential diagnosis includes an encapsulated papillary carcinoma, papillary ductal carcinoma in situ and solid papillary carcinoma. Dr. Arby Barrette and Dr. Alric Seton have reviewed the case and concur with the above diagnosis. The [facility] was notified on 03/21/2022. Jaquita Folds MD Pathologist, Electronic Signature (Case signed 03/21/2022)   CLINICAL DATA:  71 year old female recalled from screening mammogram dated 01/27/2022 for a possible right breast mass and possible left breast masses.   EXAM: DIGITAL DIAGNOSTIC BILATERAL MAMMOGRAM WITH TOMOSYNTHESIS AND CAD; ULTRASOUND LEFT BREAST LIMITED; ULTRASOUND RIGHT BREAST LIMITED   TECHNIQUE: Bilateral digital diagnostic mammography and breast tomosynthesis was performed. The images were evaluated with computer-aided detection.; Targeted ultrasound examination of the left breast was performed.; Targeted ultrasound examination of the right breast was performed   COMPARISON:  None available.   ACR Breast Density Category c: The breast tissue is heterogeneously dense, which may obscure small masses.   FINDINGS: There is a persistent oval,  spiculated hyperdense mass in the upper outer right breast at posterior depth. Circumscribed masses in the deep central left breast are smaller on today's additional views, similar to multiple prior studies.   Targeted ultrasound is performed, showing an oval, circumscribed hypoechoic mass with associated vascularity at the 9:30 position 10 cm from the nipple. It measures 1.2 x 1.0 x 0.9 cm. This corresponds with the mammographic finding. Evaluation of the right axilla demonstrates no suspicious findings. Evaluation of the upper outer left breast demonstrates scattered fibrocystic changes without focal or suspicious sonographic abnormality.   IMPRESSION: 1. Indeterminate right breast mass at the 9:30 position. Recommendation is for ultrasound-guided biopsy. 2. No suspicious right axillary lymphadenopathy. 3. No suspicious mammographic or sonographic findings on the left.   RECOMMENDATION: Ultrasound-guided biopsy of the right breast.   I have  discussed the findings and recommendations with the patient. If applicable, a reminder letter will be sent to the patient regarding the next appointment.   BI-RADS CATEGORY  4: Suspicious.     Electronically Signed   By: Kristopher Oppenheim M.D.   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   Right radioactive seed localized lumpectomy.The surgical procedure has been discussed with the patient.  Potential risks, benefits, alternative treatments, and expected outcomes have been explained.  All of the patient's questions at this time have been answered.  The likelihood of reaching the patient's treatment goal is good.  The patient understand the proposed surgical procedure and wishes to proceed.     No follow-ups on file.   Shaquil Aldana Jearld Adjutant, MD  03/29/2022 4:15  PM

## 2022-03-30 ENCOUNTER — Other Ambulatory Visit: Payer: Self-pay | Admitting: Surgery

## 2022-03-30 DIAGNOSIS — N6311 Unspecified lump in the right breast, upper outer quadrant: Secondary | ICD-10-CM

## 2022-04-01 LAB — FECAL OCCULT BLOOD, IMMUNOCHEMICAL: Fecal Occult Bld: NEGATIVE

## 2022-04-07 ENCOUNTER — Other Ambulatory Visit: Payer: Self-pay

## 2022-04-07 ENCOUNTER — Encounter (HOSPITAL_BASED_OUTPATIENT_CLINIC_OR_DEPARTMENT_OTHER): Payer: Self-pay | Admitting: Surgery

## 2022-04-13 ENCOUNTER — Other Ambulatory Visit: Payer: Self-pay | Admitting: Nurse Practitioner

## 2022-04-13 ENCOUNTER — Telehealth: Payer: Self-pay

## 2022-04-13 ENCOUNTER — Other Ambulatory Visit: Payer: Self-pay

## 2022-04-13 DIAGNOSIS — I10 Essential (primary) hypertension: Secondary | ICD-10-CM

## 2022-04-13 MED ORDER — LOSARTAN POTASSIUM 100 MG PO TABS
ORAL_TABLET | Freq: Every day | ORAL | 0 refills | Status: DC
  Filled 2022-04-13: qty 30, 30d supply, fill #0

## 2022-04-13 NOTE — Telephone Encounter (Signed)
[employer] [employer] [phone]), Per signed [employer] form(see Media), Patient's daughter, Maria Dominguez informed negative FIT test results, verbalized understanding.

## 2022-04-14 ENCOUNTER — Other Ambulatory Visit: Payer: Self-pay

## 2022-04-19 ENCOUNTER — Encounter (HOSPITAL_BASED_OUTPATIENT_CLINIC_OR_DEPARTMENT_OTHER)
Admission: RE | Admit: 2022-04-19 | Discharge: 2022-04-19 | Disposition: A | Discharge status: Home / Self Care | Payer: No Typology Code available for payment source | Source: Ambulatory Visit | Attending: Surgery | Admitting: Surgery

## 2022-04-19 NOTE — Progress Notes (Signed)

## 2022-04-20 ENCOUNTER — Ambulatory Visit
Admission: RE | Admit: 2022-04-20 | Discharge: 2022-04-20 | Disposition: A | Discharge status: Home / Self Care | Payer: No Typology Code available for payment source | Source: Ambulatory Visit | Attending: Surgery | Admitting: Surgery

## 2022-04-20 DIAGNOSIS — N6311 Unspecified lump in the right breast, upper outer quadrant: Secondary | ICD-10-CM

## 2022-04-21 ENCOUNTER — Other Ambulatory Visit: Payer: Self-pay

## 2022-04-21 ENCOUNTER — Ambulatory Visit (HOSPITAL_BASED_OUTPATIENT_CLINIC_OR_DEPARTMENT_OTHER): Payer: No Typology Code available for payment source | Admitting: Anesthesiology

## 2022-04-21 ENCOUNTER — Encounter (HOSPITAL_BASED_OUTPATIENT_CLINIC_OR_DEPARTMENT_OTHER): Payer: Self-pay | Admitting: Surgery

## 2022-04-21 ENCOUNTER — Ambulatory Visit (HOSPITAL_BASED_OUTPATIENT_CLINIC_OR_DEPARTMENT_OTHER)
Admission: RE | Admit: 2022-04-21 | Discharge: 2022-04-21 | Disposition: A | Discharge status: Home / Self Care | Payer: No Typology Code available for payment source | Attending: Surgery | Admitting: Surgery

## 2022-04-21 ENCOUNTER — Ambulatory Visit
Admission: RE | Admit: 2022-04-21 | Discharge: 2022-04-21 | Disposition: A | Discharge status: Home / Self Care | Payer: No Typology Code available for payment source | Source: Ambulatory Visit | Attending: Surgery | Admitting: Surgery

## 2022-04-21 ENCOUNTER — Encounter (HOSPITAL_BASED_OUTPATIENT_CLINIC_OR_DEPARTMENT_OTHER)
Admission: RE | Disposition: A | Discharge status: Home / Self Care | Payer: Self-pay | Source: Home / Self Care | Attending: Surgery

## 2022-04-21 DIAGNOSIS — Z87891 Personal history of nicotine dependence: Secondary | ICD-10-CM

## 2022-04-21 DIAGNOSIS — C50911 Malignant neoplasm of unspecified site of right female breast: Secondary | ICD-10-CM | POA: Insufficient documentation

## 2022-04-21 DIAGNOSIS — Z79899 Other long term (current) drug therapy: Secondary | ICD-10-CM | POA: Insufficient documentation

## 2022-04-21 DIAGNOSIS — I10 Essential (primary) hypertension: Secondary | ICD-10-CM

## 2022-04-21 DIAGNOSIS — K279 Peptic ulcer, site unspecified, unspecified as acute or chronic, without hemorrhage or perforation: Secondary | ICD-10-CM

## 2022-04-21 DIAGNOSIS — K219 Gastro-esophageal reflux disease without esophagitis: Secondary | ICD-10-CM | POA: Insufficient documentation

## 2022-04-21 DIAGNOSIS — R7303 Prediabetes: Secondary | ICD-10-CM | POA: Insufficient documentation

## 2022-04-21 DIAGNOSIS — N6311 Unspecified lump in the right breast, upper outer quadrant: Secondary | ICD-10-CM

## 2022-04-21 DIAGNOSIS — N6310 Unspecified lump in the right breast, unspecified quadrant: Secondary | ICD-10-CM

## 2022-04-21 HISTORY — DX: Other complications of anesthesia, initial encounter: T88.59XA

## 2022-04-21 HISTORY — DX: Gastro-esophageal reflux disease without esophagitis: K21.9

## 2022-04-21 HISTORY — PX: BREAST LUMPECTOMY WITH RADIOACTIVE SEED LOCALIZATION: SHX6424

## 2022-04-21 SURGERY — BREAST LUMPECTOMY WITH RADIOACTIVE SEED LOCALIZATION
Anesthesia: General | Site: Breast | Laterality: Right

## 2022-04-21 MED ORDER — DEXAMETHASONE SODIUM PHOSPHATE 10 MG/ML IJ SOLN
INTRAMUSCULAR | Status: AC
  Filled 2022-04-21: qty 1

## 2022-04-21 MED ORDER — MIDAZOLAM HCL 5 MG/5ML IJ SOLN
INTRAMUSCULAR | Status: DC | PRN
  Administered 2022-04-21: 1 mg via INTRAVENOUS

## 2022-04-21 MED ORDER — AMISULPRIDE (ANTIEMETIC) 5 MG/2ML IV SOLN: 10.0000 mg | Freq: Once | INTRAVENOUS | Status: DC | PRN

## 2022-04-21 MED ORDER — DEXMEDETOMIDINE (PRECEDEX) IN NS 20 MCG/5ML (4 MCG/ML) IV SYRINGE
PREFILLED_SYRINGE | INTRAVENOUS | Status: DC | PRN
  Administered 2022-04-21: 8 ug via INTRAVENOUS

## 2022-04-21 MED ORDER — EPHEDRINE SULFATE (PRESSORS) 50 MG/ML IJ SOLN
INTRAMUSCULAR | Status: DC | PRN
  Administered 2022-04-21 (×2): 5 mg via INTRAVENOUS

## 2022-04-21 MED ORDER — CEFAZOLIN SODIUM-DEXTROSE 2-4 GM/100ML-% IV SOLN
INTRAVENOUS | Status: AC
  Filled 2022-04-21: qty 100

## 2022-04-21 MED ORDER — BUPIVACAINE-EPINEPHRINE 0.25% -1:200000 IJ SOLN
INTRAMUSCULAR | Status: DC | PRN
  Administered 2022-04-21: 10 mL

## 2022-04-21 MED ORDER — OXYCODONE HCL 5 MG PO TABS: 5.0000 mg | ORAL_TABLET | Freq: Once | ORAL | Status: DC | PRN

## 2022-04-21 MED ORDER — LACTATED RINGERS IV SOLN
INTRAVENOUS | Status: DC
Start: 2022-04-21 — End: 2022-04-21

## 2022-04-21 MED ORDER — PROPOFOL 10 MG/ML IV BOLUS
INTRAVENOUS | Status: AC
  Filled 2022-04-21: qty 20

## 2022-04-21 MED ORDER — CEFAZOLIN SODIUM-DEXTROSE 2-4 GM/100ML-% IV SOLN
2.0000 g | INTRAVENOUS | Status: AC
  Administered 2022-04-21: 2 g via INTRAVENOUS

## 2022-04-21 MED ORDER — CHLORHEXIDINE GLUCONATE CLOTH 2 % EX PADS: 6.0000 | MEDICATED_PAD | Freq: Once | CUTANEOUS | Status: DC

## 2022-04-21 MED ORDER — ONDANSETRON HCL 4 MG/2ML IJ SOLN
INTRAMUSCULAR | Status: AC
  Filled 2022-04-21: qty 2

## 2022-04-21 MED ORDER — FENTANYL CITRATE (PF) 100 MCG/2ML IJ SOLN
INTRAMUSCULAR | Status: AC
  Filled 2022-04-21: qty 2

## 2022-04-21 MED ORDER — MIDAZOLAM HCL 2 MG/2ML IJ SOLN
INTRAMUSCULAR | Status: AC
  Filled 2022-04-21: qty 2

## 2022-04-21 MED ORDER — FENTANYL CITRATE (PF) 100 MCG/2ML IJ SOLN: 25.0000 ug | INTRAMUSCULAR | Status: DC | PRN

## 2022-04-21 MED ORDER — PHENYLEPHRINE 80 MCG/ML (10ML) SYRINGE FOR IV PUSH (FOR BLOOD PRESSURE SUPPORT)
PREFILLED_SYRINGE | INTRAVENOUS | Status: AC
  Filled 2022-04-21: qty 10

## 2022-04-21 MED ORDER — LIDOCAINE 2% (20 MG/ML) 5 ML SYRINGE
INTRAMUSCULAR | Status: AC
  Filled 2022-04-21: qty 5

## 2022-04-21 MED ORDER — ONDANSETRON HCL 4 MG/2ML IJ SOLN: 4.0000 mg | Freq: Once | INTRAMUSCULAR | Status: DC | PRN

## 2022-04-21 MED ORDER — ACETAMINOPHEN 500 MG PO TABS
1000.0000 mg | ORAL_TABLET | ORAL | Status: AC
  Administered 2022-04-21: 1000 mg via ORAL

## 2022-04-21 MED ORDER — FENTANYL CITRATE (PF) 100 MCG/2ML IJ SOLN
INTRAMUSCULAR | Status: DC | PRN
  Administered 2022-04-21: 25 ug via INTRAVENOUS
  Administered 2022-04-21: 50 ug via INTRAVENOUS
  Administered 2022-04-21: 25 ug via INTRAVENOUS

## 2022-04-21 MED ORDER — ACETAMINOPHEN 500 MG PO TABS
ORAL_TABLET | ORAL | Status: AC
  Filled 2022-04-21: qty 2

## 2022-04-21 MED ORDER — LIDOCAINE HCL 1 % IJ SOLN
INTRAMUSCULAR | Status: DC | PRN
  Administered 2022-04-21: 80 mg via INTRADERMAL

## 2022-04-21 MED ORDER — HYDRALAZINE HCL 20 MG/ML IJ SOLN: 10.0000 mg | INTRAMUSCULAR | Status: DC | PRN

## 2022-04-21 MED ORDER — PROPOFOL 10 MG/ML IV BOLUS
INTRAVENOUS | Status: DC | PRN
  Administered 2022-04-21: 100 mg via INTRAVENOUS

## 2022-04-21 MED ORDER — OXYCODONE HCL 5 MG/5ML PO SOLN: 5.0000 mg | Freq: Once | ORAL | Status: DC | PRN

## 2022-04-21 SURGICAL SUPPLY — 54 items
APL PRP STRL LF DISP 70% ISPRP (MISCELLANEOUS) ×1
APL SKNCLS STERI-STRIP NONHPOA (GAUZE/BANDAGES/DRESSINGS) ×1
APPLIER CLIP 9.375 MED OPEN (MISCELLANEOUS) ×2
APR CLP MED 9.3 20 MLT OPN (MISCELLANEOUS) ×1
BENZOIN TINCTURE PRP APPL 2/3 (GAUZE/BANDAGES/DRESSINGS) ×2 IMPLANT
BLADE HEX COATED 2.75 (ELECTRODE) ×2 IMPLANT
BLADE SURG 15 STRL LF DISP TIS (BLADE) ×1 IMPLANT
BLADE SURG 15 STRL SS (BLADE) ×2
CANISTER SUC SOCK COL 7IN (MISCELLANEOUS) IMPLANT
CANISTER SUCT 1200ML W/VALVE (MISCELLANEOUS) ×1 IMPLANT
CHLORAPREP W/TINT 26 (MISCELLANEOUS) ×2 IMPLANT
CLIP APPLIE 9.375 MED OPEN (MISCELLANEOUS) ×1 IMPLANT
COVER BACK TABLE 60X90IN (DRAPES) ×2 IMPLANT
COVER MAYO STAND STRL (DRAPES) ×2 IMPLANT
COVER PROBE W GEL 5X96 (DRAPES) ×2 IMPLANT
DRAPE LAPAROTOMY 100X72 PEDS (DRAPES) ×2 IMPLANT
DRAPE UTILITY XL STRL (DRAPES) ×2 IMPLANT
DRSG TEGADERM 4X4.75 (GAUZE/BANDAGES/DRESSINGS) ×2 IMPLANT
ELECT REM PT RETURN 9FT ADLT (ELECTROSURGICAL) ×2
ELECTRODE REM PT RTRN 9FT ADLT (ELECTROSURGICAL) ×1 IMPLANT
GAUZE SPONGE 4X4 12PLY STRL LF (GAUZE/BANDAGES/DRESSINGS) ×1 IMPLANT
GLOVE BIO SURGEON STRL SZ7 (GLOVE) ×2 IMPLANT
GLOVE BIOGEL PI IND STRL 7.5 (GLOVE) ×1 IMPLANT
GLOVE BIOGEL PI IND STRL 8 (GLOVE) IMPLANT
GLOVE BIOGEL PI INDICATOR 7.5 (GLOVE) ×1
GLOVE BIOGEL PI INDICATOR 8 (GLOVE) ×1
GLOVE SURG SYN 7.0 (GLOVE) ×2 IMPLANT
GLOVE SURG SYN 7.0 PF PI (GLOVE) IMPLANT
GLOVE SURG SYN 7.5  E (GLOVE) ×1
GLOVE SURG SYN 7.5 E (GLOVE) ×1 IMPLANT
GLOVE SURG SYN 7.5 PF PI (GLOVE) IMPLANT
GOWN STRL REUS W/ TWL LRG LVL3 (GOWN DISPOSABLE) ×2 IMPLANT
GOWN STRL REUS W/ TWL XL LVL3 (GOWN DISPOSABLE) IMPLANT
GOWN STRL REUS W/TWL LRG LVL3 (GOWN DISPOSABLE) ×4
GOWN STRL REUS W/TWL XL LVL3 (GOWN DISPOSABLE) ×2
KIT MARKER MARGIN INK (KITS) ×2 IMPLANT
NDL HYPO 25X1 1.5 SAFETY (NEEDLE) ×1 IMPLANT
NEEDLE HYPO 25X1 1.5 SAFETY (NEEDLE) ×2 IMPLANT
NS IRRIG 1000ML POUR BTL (IV SOLUTION) ×2 IMPLANT
PACK BASIN DAY SURGERY FS (CUSTOM PROCEDURE TRAY) ×2 IMPLANT
PENCIL SMOKE EVACUATOR (MISCELLANEOUS) ×2 IMPLANT
SLEEVE SCD COMPRESS KNEE MED (STOCKING) ×2 IMPLANT
SPONGE GAUZE 2X2 8PLY STRL LF (GAUZE/BANDAGES/DRESSINGS) IMPLANT
SPONGE T-LAP 4X18 ~~LOC~~+RFID (SPONGE) ×2 IMPLANT
STRIP CLOSURE SKIN 1/2X4 (GAUZE/BANDAGES/DRESSINGS) ×2 IMPLANT
SUT MON AB 4-0 PC3 18 (SUTURE) ×2 IMPLANT
SUT VIC AB 3-0 SH 27 (SUTURE) ×2
SUT VIC AB 3-0 SH 27X BRD (SUTURE) ×1 IMPLANT
SYR BULB EAR ULCER 3OZ GRN STR (SYRINGE) IMPLANT
SYR CONTROL 10ML LL (SYRINGE) ×2 IMPLANT
TOWEL GREEN STERILE FF (TOWEL DISPOSABLE) ×2 IMPLANT
TRAY FAXITRON CT DISP (TRAY / TRAY PROCEDURE) ×2 IMPLANT
TUBE CONNECTING 20X1/4 (TUBING) ×1 IMPLANT
YANKAUER SUCT BULB TIP NO VENT (SUCTIONS) ×1 IMPLANT

## 2022-04-21 NOTE — Anesthesia Procedure Notes (Signed)
Procedure Name: LMA Insertion Date/Time: 04/21/2022 1:13 PM  Performed by: Garrel Ridgel, CRNAPre-anesthesia Checklist: Patient identified, Emergency Drugs available, Suction available and Patient being monitored Patient Re-evaluated:Patient Re-evaluated prior to induction Oxygen Delivery Method: Circle system utilized Preoxygenation: Pre-oxygenation with 100% oxygen Induction Type: IV induction Ventilation: Mask ventilation without difficulty LMA: LMA inserted LMA Size: 3.0 Number of attempts: 1 Tube secured with: Tape Dental Injury: Teeth and Oropharynx as per pre-operative assessment

## 2022-04-21 NOTE — Discharge Instructions (Addendum)
[place] Office Phone Number [phone]  BREAST BIOPSY/ PARTIAL MASTECTOMY: POST OP INSTRUCTIONS  Always review your discharge instruction sheet given to you by the facility where your surgery was performed.  IF YOU HAVE DISABILITY OR FAMILY LEAVE FORMS, YOU MUST BRING THEM TO THE OFFICE FOR PROCESSING.  DO NOT GIVE THEM TO YOUR DOCTOR.  A prescription for pain medication may be given to you upon discharge.  Take your pain medication as prescribed, if needed.  If narcotic pain medicine is not needed, then you may take acetaminophen (Tylenol) or ibuprofen (Advil) as needed. Take your usually prescribed medications unless otherwise directed If you need a refill on your pain medication, please contact your pharmacy.  They will contact our office to request authorization.  Prescriptions will not be filled after 5pm or on week-ends. You should eat very light the first 24 hours after surgery, such as soup, crackers, pudding, etc.  Resume your normal diet the day after surgery. Most patients will experience some swelling and bruising in the breast.  Ice packs and a good support bra will help.  Swelling and bruising can take several days to resolve.  It is common to experience some constipation if taking pain medication after surgery.  Increasing fluid intake and taking a stool softener will usually help or prevent this problem from occurring.  A mild laxative (Milk of Magnesia or Miralax) should be taken according to package directions if there are no bowel movements after 48 hours. Unless discharge instructions indicate otherwise, you may remove your bandages 24-48 hours after surgery, and you may shower at that time.  You may have steri-strips (small skin tapes) in place directly over the incision.  These strips should be left on the skin for 7-10 days.  If your surgeon used skin glue on the incision, you may shower in 24 hours.  The glue will flake off over the next 2-3 weeks.  Any  sutures or staples will be removed at the office during your follow-up visit. ACTIVITIES:  You may resume regular daily activities (gradually increasing) beginning the next day.  Wearing a good support bra or sports bra minimizes pain and swelling.  You may have sexual intercourse when it is comfortable. You may drive when you no longer are taking prescription pain medication, you can comfortably wear a seatbelt, and you can safely maneuver your car and apply brakes. RETURN TO WORK:  ______________________________________________________________________________________ Dennis Bast should see your doctor in the office for a follow-up appointment approximately two weeks after your surgery.  Your doctor's nurse will typically make your follow-up appointment when she calls you with your pathology report.  Expect your pathology report 2-3 business days after your surgery.  You may call to check if you do not hear from [place] after three days. OTHER INSTRUCTIONS: _______________________________________________________________________________________________ _____________________________________________________________________________________________________________________________________ _____________________________________________________________________________________________________________________________________ _____________________________________________________________________________________________________________________________________  Evelina Bucy Tylenol at 12:30. NO TYLENOL till after 6:30 pm        WHEN TO CALL YOUR DOCTOR: Fever over 101.0 Nausea and/or vomiting. Extreme swelling or bruising. Continued bleeding from incision. Increased pain, redness, or drainage from the incision.  The clinic staff is available to answer your questions during regular business hours.  Please don't hesitate to call and ask to speak to one of the nurses for clinical concerns.  If you have a medical emergency, go to  the nearest emergency room or call 911.  A surgeon from [facility] is always on call at the hospital.  For further questions, please visit [email or web address]  Post Anesthesia Home Care Instructions  Activity: Get plenty of rest for the remainder of the day. A responsible individual must stay with you for 24 hours following the procedure.  For the next 24 hours, DO NOT: -Drive a car -Paediatric nurse -Drink alcoholic beverages -Take any medication unless instructed by your physician -Make any legal decisions or sign important papers.  Meals: Start with liquid foods such as gelatin or soup. Progress to regular foods as tolerated. Avoid greasy, spicy, heavy foods. If nausea and/or vomiting occur, drink only clear liquids until the nausea and/or vomiting subsides. Call your physician if vomiting continues.  Special Instructions/Symptoms: Your throat may feel dry or sore from the anesthesia or the breathing tube placed in your throat during surgery. If this causes discomfort, gargle with warm salt water. The discomfort should disappear within 24 hours.  If you had a scopolamine patch placed behind your ear for the management of post- operative nausea and/or vomiting:  1. The medication in the patch is effective for 72 hours, after which it should be removed.  Wrap patch in a tissue and discard in the trash. Wash hands thoroughly with soap and water. 2. You may remove the patch earlier than 72 hours if you experience unpleasant side effects which may include dry mouth, dizziness or visual disturbances. 3. Avoid touching the patch. Wash your hands with soap and water after contact with the patch.

## 2022-04-21 NOTE — Interval H&P Note (Signed)
History and Physical Interval Note:  04/21/2022 12:12 PM  Maria Dominguez  has presented today for surgery, with the diagnosis of RIGHT BREAST ATYPICAL PAPILLARY PROLIFERATION.  The various methods of treatment have been discussed with the patient and family. After consideration of risks, benefits and other options for treatment, the patient has consented to  Procedure(s) with comments: RIGHT BREAST LUMPECTOMY WITH RADIOACTIVE SEED LOCALIZATION (Right) - LMA as a surgical intervention.  The patient's history has been reviewed, patient examined, no change in status, stable for surgery.  I have reviewed the patient's chart and labs.  Questions were answered to the patient's satisfaction.     Maia Petties

## 2022-04-21 NOTE — Op Note (Signed)
Pre-op Diagnosis:  Right breast mass Post-op Diagnosis: same Procedure:  Right radioactive seed localized lumpectomy Surgeon:  Kenadi Miltner K. Resident:  Clerance Lav, MD I was personally present during the key and critical portions of this procedure and immediately available throughout the entire procedure, as documented in my operative note.  Anesthesia:  GEN - LMA Indications:    This is a 71 year old female with a history of left breast cancer treated with lumpectomy and tamoxifen in 2010 who presents after recent screening mammogram revealed suspicious findings in both breasts.  Further work-up showed no sign of a left breast mass.  However the right breast showed indeterminate mass located at 930, 10 cm from the nipple.  This measures 1.2 x 1.0 x 0.9 cm.  This area was biopsied under ultrasound guidance.  Biopsy revealed atypical papillary proliferation.  The mass is concerning as the differential diagnosis includes papillary cancer.  She is referred to [place] for surgery. Description of procedure: The patient is brought to the operating room placed in supine position on the operating room table. After an adequate level of general anesthesia was obtained, her right breast was prepped with ChloraPrep and draped in sterile fashion. A timeout was taken to ensure the proper patient and proper procedure. We interrogated the breast with the neoprobe. We made a transverse incision in the lateral right breast after infiltrating with 0.25% Marcaine. Dissection was carried down in the breast tissue with cautery. We used the neoprobe to guide [place] towards the radioactive seed. We excised an area of tissue around the radioactive seed 2 cm in diameter. The specimen was removed and was oriented with a paint kit. Specimen mammogram showed the radioactive seed as well as the biopsy clip within the specimen. This was sent for pathologic examination. There is no residual radioactivity within the biopsy cavity. We  inspected carefully for hemostasis. The wound was thoroughly irrigated. The wound was closed with a deep layer of 3-0 Vicryl and a subcuticular layer of 4-0 Monocryl. Benzoin Steri-Strips were applied. The patient was then extubated and brought to the recovery room in stable condition. All sponge, instrument, and needle counts are correct.  Imogene Burn. Georgette Dover, MD, [facility]  General/ Trauma Surgery  04/21/2022 2:13 PM

## 2022-04-21 NOTE — Transfer of Care (Signed)
Immediate Anesthesia Transfer of Care Note  Patient: Maria Dominguez  Procedure(s) Performed: RIGHT BREAST LUMPECTOMY WITH RADIOACTIVE SEED LOCALIZATION (Right: Breast)  Patient Location: PACU  Anesthesia Type:General  Level of Consciousness: awake, alert , oriented and patient cooperative  Airway & Oxygen Therapy: Patient Spontanous Breathing and Patient connected to face mask oxygen  Post-op Assessment: Report given to RN and Post -op Vital signs reviewed and stable  Post vital signs: Reviewed and stable  Last Vitals:  Vitals Value Taken Time  BP    Temp    Pulse    Resp    SpO2      Last Pain:  Vitals:   04/21/22 1204  TempSrc: Oral  PainSc: 0-No pain         Complications: No notable events documented.

## 2022-04-21 NOTE — Anesthesia Postprocedure Evaluation (Signed)
Anesthesia Post Note  Patient: Maria Dominguez  Procedure(s) Performed: RIGHT BREAST LUMPECTOMY WITH RADIOACTIVE SEED LOCALIZATION (Right: Breast)     Patient location during evaluation: PACU Anesthesia Type: General Level of consciousness: awake and alert, oriented and patient cooperative Pain management: pain level controlled Vital Signs Assessment: post-procedure vital signs reviewed and stable Respiratory status: spontaneous breathing, nonlabored ventilation and respiratory function stable Cardiovascular status: blood pressure returned to baseline and stable Postop Assessment: no apparent nausea or vomiting Anesthetic complications: no   No notable events documented.  Last Vitals:  Vitals:   04/21/22 1448 04/21/22 1500  BP:  (!) 183/63  Pulse: 70 67  Resp: 18   Temp:  36.8 C  SpO2: 92% 97%    Last Pain:  Vitals:   04/21/22 1500  TempSrc: Oral  PainSc: 0-No pain                 Pervis Hocking

## 2022-04-21 NOTE — Anesthesia Preprocedure Evaluation (Addendum)
Anesthesia Evaluation  Patient identified by MRN, date of birth, ID band Patient awake    Reviewed: Allergy & Precautions, NPO status , Patient's Chart, lab work & pertinent test results  Airway Mallampati: II  TM Distance: >3 FB Neck ROM: Full    Dental  (+) Upper Dentures   Pulmonary former smoker,    Pulmonary exam normal breath sounds clear to auscultation       Cardiovascular hypertension (poorly controlled), Pt. on medications Normal cardiovascular exam Rhythm:Regular Rate:Normal  Normal sinus rhythm Cannot rule out Anterior infarct , age undetermined Abnormal ECG When compared with ECG of 24-Nov-2019 23:09, No significant change since last tracing Confirmed by Berniece Salines [phone]) on 04/19/2022 6:56:26 PM   Neuro/Psych    GI/Hepatic Neg liver ROS, PUD, GERD  ,  Endo/Other  prediabetes  Renal/GU negative Renal ROS  negative genitourinary   Musculoskeletal negative musculoskeletal ROS (+)   Abdominal   Peds negative pediatric ROS (+)  Hematology   Anesthesia Other Findings Breast cancer  Reproductive/Obstetrics negative OB ROS                          Anesthesia Physical Anesthesia Plan  ASA: 3  Anesthesia Plan: General   Post-op Pain Management: Tylenol PO (pre-op)*   Induction: Intravenous  PONV Risk Score and Plan: 3 and Treatment may vary due to age or medical condition, Ondansetron and Dexamethasone  Airway Management Planned:   Additional Equipment: None  Intra-op Plan:   Post-operative Plan: Extubation in OR  Informed Consent: I have reviewed the patients History and Physical, chart, labs and discussed the procedure including the risks, benefits and alternatives for the proposed anesthesia with the patient or authorized representative who has indicated his/her understanding and acceptance.     Dental advisory given  Plan Discussed with: CRNA, Anesthesiologist  and Surgeon  Anesthesia Plan Comments:         Anesthesia Quick Evaluation

## 2022-04-22 ENCOUNTER — Encounter (HOSPITAL_BASED_OUTPATIENT_CLINIC_OR_DEPARTMENT_OTHER): Payer: Self-pay | Admitting: Surgery

## 2022-04-26 ENCOUNTER — Encounter (HOSPITAL_COMMUNITY): Payer: Self-pay

## 2022-04-28 ENCOUNTER — Other Ambulatory Visit: Payer: Self-pay | Admitting: *Deleted

## 2022-04-28 DIAGNOSIS — C50411 Malignant neoplasm of upper-outer quadrant of right female breast: Secondary | ICD-10-CM

## 2022-04-29 ENCOUNTER — Other Ambulatory Visit: Payer: Self-pay

## 2022-04-29 ENCOUNTER — Telehealth: Payer: Self-pay

## 2022-04-29 ENCOUNTER — Telehealth: Payer: Self-pay | Admitting: Hematology and Oncology

## 2022-04-29 ENCOUNTER — Ambulatory Visit: Payer: Self-pay | Attending: Nurse Practitioner | Admitting: Nurse Practitioner

## 2022-04-29 ENCOUNTER — Encounter: Payer: Self-pay | Admitting: Nurse Practitioner

## 2022-04-29 VITALS — BP 145/86 | HR 79 | Wt 152.6 lb

## 2022-04-29 DIAGNOSIS — M25551 Pain in right hip: Secondary | ICD-10-CM

## 2022-04-29 DIAGNOSIS — F5101 Primary insomnia: Secondary | ICD-10-CM

## 2022-04-29 DIAGNOSIS — R109 Unspecified abdominal pain: Secondary | ICD-10-CM

## 2022-04-29 DIAGNOSIS — I10 Essential (primary) hypertension: Secondary | ICD-10-CM

## 2022-04-29 DIAGNOSIS — E782 Mixed hyperlipidemia: Secondary | ICD-10-CM

## 2022-04-29 MED ORDER — TRAZODONE HCL 50 MG PO TABS
25.0000 mg | ORAL_TABLET | Freq: Every evening | ORAL | 3 refills | Status: DC | PRN
  Filled 2022-04-29 – 2022-05-10 (×2): qty 30, 30d supply, fill #0

## 2022-04-29 MED ORDER — LOSARTAN POTASSIUM-HCTZ 100-25 MG PO TABS
1.0000 | ORAL_TABLET | Freq: Every day | ORAL | 1 refills | Status: DC
  Filled 2022-04-29: qty 30, 30d supply, fill #0
  Filled 2022-05-10: qty 90, 90d supply, fill #0

## 2022-04-29 NOTE — Telephone Encounter (Signed)
Via Lavon Paganini, Spanish Intepreter, [place], Patient was contacted to verify [place]. citizenship to determine if eligible to apply for [facility] as patient was diagnosed with breast cancer. 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

## 2022-04-29 NOTE — Progress Notes (Signed)
Assessment & Plan:  Maria Dominguez was seen today for hypertension.  Diagnoses and all orders for this visit:  Essential hypertension -     losartan-hydrochlorothiazide (HYZAAR) 100-25 MG tablet; Take 1 tablet by mouth daily. -     CMP14+EGFR Continue all antihypertensives as prescribed.  Reminded to bring in blood pressure log for follow  up appointment.  RECOMMENDATIONS: DASH/Mediterranean Diets are healthier choices for HTN.    Right hip pain -     DG Hip Unilat W OR W/O Pelvis Min 4 Views Right; Future  Right flank pain -     CBC with Differential -     Urinalysis, Complete -     DG Hip Unilat W OR W/O Pelvis Min 4 Views Right; Future  Mixed hyperlipidemia -     Lipid panel  Primary insomnia -     traZODone (DESYREL) 50 MG tablet; Take 0.5-1 tablets (25-50 mg total) by mouth at bedtime as needed for sleep.    Patient has been counseled on age-appropriate routine health concerns for screening and prevention. These are reviewed and up-to-date. Referrals have been placed accordingly. Immunizations are up-to-date or declined.    Subjective:   Chief Complaint  Patient presents with   Hypertension   HPI Maria Dominguez 71 y.o. 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    VRI was used to communicate directly with patient for the entire encounter including providing detailed patient instructions.    Abdominal Pain: Patient complains of abdominal pain. The pain is described as aching, dull, and pressure-like, and is located in the RLQ, right flank and right pelvis without radiation. Onset was several weeks ago. Symptoms have been unchanged since. Aggravating factors: none.  Alleviating factors:  acetaminophen. Associated symptoms: none however she does have chronic constipation. The patient denies chills, diarrhea, fever, hematochezia, hematuria, melena, nausea, sweats, and vomiting.   HTN Blood pressure is elevated. Will dc losartan and switch to hyzaar 100-25 mg daily BP Readings from Last 3 Encounters:  04/29/22 (!) 145/86  04/21/22 (!) 183/63  03/15/22 (!) 170/92     Insomnia She has primary insomnia with difficulty falling asleep and staying asleep. She has been prescribed trazodone for this which was helpful however she stopped taking it because she does not like taking a lot of medications and is afraid she will become dependent.    Marland Kitchen HTN BP Readings from Last 3 Encounters:  04/29/22 (!) 145/86  04/21/22 (!) 183/63  03/15/22 (!) 170/92     Review of Systems  Constitutional:  Negative for fever, malaise/fatigue and weight loss.  HENT: Negative.  Negative for nosebleeds.   Eyes: Negative.  Negative for blurred vision, double vision and photophobia.  Respiratory: Negative.  Negative for cough and shortness of breath.   Cardiovascular: Negative.  Negative for chest pain, palpitations and leg swelling.  Gastrointestinal:  Positive for abdominal pain and constipation. Negative for blood in stool, diarrhea, heartburn, melena, nausea and vomiting.  Genitourinary: Negative.   Musculoskeletal: Negative.  Negative for myalgias.  Neurological: Negative.  Negative for dizziness, focal weakness, seizures and headaches.  Psychiatric/Behavioral:  Negative for suicidal ideas. The patient has insomnia.     Past Medical History:  Diagnosis Date   Cancer ([place]) 2010  left breast cancer-lumpectomy   Complication of anesthesia    hard to wake after humerous   Diverticulitis of colon with perforation    Fatty liver    GERD (gastroesophageal reflux disease)    Hx of breast cancer    Hyperlipidemia    Hypertension    Perforated gastric ulcer ([place])     Past Surgical History:   Procedure Laterality Date   BREAST BIOPSY Right 03/16/2022   BREAST EXCISIONAL BIOPSY Left    unsure if patient has had breast ca, took tamoxifen   BREAST LUMPECTOMY WITH RADIOACTIVE SEED LOCALIZATION Right 04/21/2022   Procedure: RIGHT BREAST LUMPECTOMY WITH RADIOACTIVE SEED LOCALIZATION;  Surgeon: Donnie Mesa, MD;  Location: [facility];  Service: General;  Laterality: Right;  LMA   HUMERUS FRACTURE SURGERY     left lumpectomy     MASS EXCISION Left    left axilla    Family History  Problem Relation Age of Onset   Diabetes Mother    Breast cancer Sister    Pancreatic cancer Cousin    Pancreatic cancer Cousin    Colon cancer Neg Hx    Esophageal cancer Neg Hx    Rectal cancer Neg Hx    Stomach cancer Neg Hx     Social History Reviewed with no changes to be made today.   Outpatient Medications Prior to Visit  Medication Sig Dispense Refill   cholecalciferol (VITAMIN D3) 25 MCG (1000 UNIT) tablet Take 1 tablet (1,000 Units total) by mouth daily. 90 tablet 1   ibuprofen (ADVIL) 400 MG tablet Take 400 mg by mouth every 6 (six) hours as needed.     omeprazole (PRILOSEC) 20 MG capsule TAKE 1 TABLET (20 MG TOTAL) BY MOUTH DAILY. 90 capsule 1   rosuvastatin (CRESTOR) 20 MG tablet TAKE 1 TABLET (20 MG TOTAL) BY MOUTH DAILY. 90 tablet 3   losartan (COZAAR) 100 MG tablet TAKE 1 TABLET (100 MG TOTAL) BY MOUTH DAILY. 30 tablet 0   omega-3 acid ethyl esters (LOVAZA) 1 g capsule Take 1 capsule (1 g total) by mouth 2 (two) times daily. 60 capsule 3   Facility-Administered Medications Prior to Visit  Medication Dose Route Frequency Provider Last Rate Last Admin   bupivacaine liposome (EXPAREL) 1.3 % injection 266 mg  20 mL Infiltration Once Stark Klein, MD        No Known Allergies     Objective:    BP (!) 145/86   Pulse 79   Wt 152 lb 9.6 oz (69.2 kg)   SpO2 96%   BMI 30.82 kg/m  Wt Readings from Last 3 Encounters:  04/29/22 152 lb 9.6 oz (69.2 kg)  04/21/22  152 lb 12.5 oz (69.3 kg)  03/15/22 155 lb 12.8 oz (70.7 kg)    Physical Exam Vitals and nursing note reviewed.  Constitutional:      Appearance: She is well-developed.  HENT:     Head: Normocephalic and atraumatic.  Cardiovascular:     Rate and Rhythm: Normal rate and regular rhythm.     Heart sounds: Normal heart sounds. No murmur heard.    No friction rub. No gallop.  Pulmonary:     Effort: Pulmonary effort is normal. No tachypnea or respiratory distress.     Breath sounds: Normal breath sounds. No decreased breath sounds, wheezing, rhonchi or rales.  Chest:     Chest wall: No tenderness.  Abdominal:     General: Bowel sounds are normal.     Palpations:  Abdomen is soft.     Tenderness: There is abdominal tenderness in the right lower quadrant. There is no right CVA tenderness or left CVA tenderness.  Musculoskeletal:        General: Normal range of motion.     Cervical back: Normal range of motion.  Skin:    General: Skin is warm and dry.  Neurological:     Mental Status: She is alert and oriented to person, place, and time.     Coordination: Coordination normal.  Psychiatric:        Behavior: Behavior normal. Behavior is cooperative.        Thought Content: Thought content normal.        Judgment: Judgment normal.          Patient has been counseled extensively about nutrition and exercise as well as the importance of adherence with medications and regular follow-up. The patient was given clear instructions to go to ER or return to medical center if symptoms don't improve, worsen or new problems develop. The patient verbalized understanding.   Follow-up: Return for 4 weeks BP check with luke. See me in 3 months.   [name], FNP-BC [facility] [place], [place]   04/29/2022, 1:27 PM

## 2022-04-29 NOTE — Telephone Encounter (Signed)
Scheduled appt per 7/20 referral. Pt is aware of appt date and time. Pt is aware to arrive 15 mins prior to appt time and to bring and updated insurance card. Pt is aware of appt location.   

## 2022-04-30 LAB — CMP14+EGFR
ALT: 12 IU/L (ref 0–32)
AST: 13 IU/L (ref 0–40)
Albumin/Globulin Ratio: 1.3 (ref 1.2–2.2)
Albumin: 4.3 g/dL (ref 3.8–4.8)
Alkaline Phosphatase: 153 IU/L — ABNORMAL HIGH (ref 44–121)
BUN/Creatinine Ratio: 23 (ref 12–28)
BUN: 16 mg/dL (ref 8–27)
Bilirubin Total: 0.4 mg/dL (ref 0.0–1.2)
CO2: 24 mmol/L (ref 20–29)
Calcium: 9.4 mg/dL (ref 8.7–10.3)
Chloride: 101 mmol/L (ref 96–106)
Creatinine, Ser: 0.69 mg/dL (ref 0.57–1.00)
Globulin, Total: 3.4 g/dL (ref 1.5–4.5)
Glucose: 88 mg/dL (ref 70–99)
Potassium: 4.6 mmol/L (ref 3.5–5.2)
Sodium: 138 mmol/L (ref 134–144)
Total Protein: 7.7 g/dL (ref 6.0–8.5)
eGFR: 93 mL/min/{1.73_m2} (ref >59)

## 2022-04-30 LAB — LIPID PANEL
Chol/HDL Ratio: 6.3 ratio — ABNORMAL HIGH (ref 0.0–4.4)
Cholesterol, Total: 200 mg/dL — ABNORMAL HIGH (ref 100–199)
HDL: 32 mg/dL — ABNORMAL LOW (ref >39)
LDL Chol Calc (NIH): 70 mg/dL (ref 0–99)
Triglycerides: 629 mg/dL (ref 0–149)
VLDL Cholesterol Cal: 98 mg/dL — ABNORMAL HIGH (ref 5–40)

## 2022-04-30 LAB — URINALYSIS, COMPLETE
Bilirubin, UA: NEGATIVE
Glucose, UA: NEGATIVE
Ketones, UA: NEGATIVE
Nitrite, UA: NEGATIVE
Protein,UA: NEGATIVE
RBC, UA: NEGATIVE
Specific Gravity, UA: 1.02 (ref 1.005–1.030)
Urobilinogen, Ur: 0.2 mg/dL (ref 0.2–1.0)
pH, UA: 5.5 (ref 5.0–7.5)

## 2022-04-30 LAB — CBC WITH DIFFERENTIAL/PLATELET
Basophils Absolute: 0 10*3/uL (ref 0.0–0.2)
Basos: 0 %
EOS (ABSOLUTE): 0.4 10*3/uL (ref 0.0–0.4)
Eos: 5 %
Hematocrit: 38.7 % (ref 34.0–46.6)
Hemoglobin: 12.6 g/dL (ref 11.1–15.9)
Immature Grans (Abs): 0 10*3/uL (ref 0.0–0.1)
Immature Granulocytes: 0 %
Lymphocytes Absolute: 0.9 10*3/uL (ref 0.7–3.1)
Lymphs: 13 %
MCH: 26.4 pg — ABNORMAL LOW (ref 26.6–33.0)
MCHC: 32.6 g/dL (ref 31.5–35.7)
MCV: 81 fL (ref 79–97)
Monocytes Absolute: 0.6 10*3/uL (ref 0.1–0.9)
Monocytes: 9 %
Neutrophils Absolute: 5.2 10*3/uL (ref 1.4–7.0)
Neutrophils: 73 %
Platelets: 370 10*3/uL (ref 150–450)
RBC: 4.77 x10E6/uL (ref 3.77–5.28)
RDW: 13.9 % (ref 11.7–15.4)
WBC: 7.2 10*3/uL (ref 3.4–10.8)

## 2022-04-30 LAB — MICROSCOPIC EXAMINATION
Casts: NONE SEEN /lpf
WBC, UA: NONE SEEN /hpf (ref 0–5)

## 2022-05-02 NOTE — Progress Notes (Signed)
Location of Breast Cancer:  Malignant neoplasm of upper-outer quadrant of right female breast  Histology per Pathology Report:  04/21/2022 FINAL MICROSCOPIC DIAGNOSIS:  A. 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 Vonna Kotyk 03/29/22 office note) Patient has a "history of left breast cancer treated with lumpectomy and tamoxifen in 2010 who presents after recent screening mammogram revealed suspicious findings in both breasts.  Further work-up showed no sign of a left breast mass.  However the right breast showed indeterminate mass located at 930, 10 cm from the nipple."  Past/Anticipated interventions by surgeon, if any:  04/21/2022 --Dr. Donnie Mesa Right radioactive seed localized lumpectomy  Past/Anticipated interventions by medical oncology, if any:  Scheduled for consultation with Dr. Benay Pike on 05/10/2022  Lymphedema issues, if any:  Reports right breast feels significant'y heavier than left    Pain issues, if any: Reports frequent pain to right breast (more prominate in the afternoon); Still very active, so she attributes the discomfort to staying busy  SAFETY ISSUES: Prior radiation? No Pacemaker/ICD? No Possible current pregnancy? No--postmenopausal Is the patient on methotrexate? No  Current Complaints / other details:  Nothing else of note

## 2022-05-02 NOTE — Progress Notes (Signed)
Radiation Oncology         [phone] ________________________________  Initial Outpatient Consultation  Name: Maria Dominguez MRN: [id number]  Date: 05/03/2022  DOB: [birth year]  [id number], Vernia Buff, NP  Donnie Mesa, MD   REFERRING PHYSICIAN: Donnie Mesa, MD  DIAGNOSIS: No diagnosis found.  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  CHIEF COMPLAINT: Here to discuss management of right breast cancer  HISTORY OF PRESENT ILLNESS::Maria Dominguez is a 71 y.o. female who presented with bilateral breast abnormalities on the following imaging: bilateral screening mammogram on the date of 01/27/22.  No symptoms, if any, were reported at that time.   Diagnostic bilateral breast mammogram and bilateral breast ultrasound on 02/24/22 further revealed: an indeterminate right breast mass at the 9:30 o'clock position, and no suspicious mammographic or sonographic findings in the left breast. No suspicious lymphadenopathy was appreciated in either axilla.     Biopsy of the right breast mass on date of 03/16/22 showed atypical papillary proliferation concerning for papillary malignancy.    The patient was referred to Dr. Georgette Dover and proceeded to undergo right breast lumpectomy without nodal biopsies on 04/21/22. Pathology from the procedure revealed: histology of encapsulated papillary carcinoma measuring 1.4 cm with invasion, and a focus of invasive ductal carcinoma measuring 0.2 cm (arising in association with intracystic papillary carcinoma); margin status to invasive disease of 0.6 cm from the inferior margin; no lymph nodes were examined.  Prognostic indicators are pending at this time.  Of note: the patient has a history of left breast lumpectomy in 2010, however no further information is available regarding this  on EMR per review.  ***  PREVIOUS RADIATION THERAPY: No  PAST MEDICAL HISTORY:  has a past medical history of Cancer ([place]) (8676), Complication of anesthesia, Diverticulitis of colon with perforation, Fatty liver, GERD (gastroesophageal reflux disease), breast cancer, Hyperlipidemia, Hypertension, and Perforated gastric ulcer ([place]).    PAST SURGICAL HISTORY: Past Surgical History:  Procedure Laterality Date   BREAST BIOPSY Right 03/16/2022   BREAST EXCISIONAL BIOPSY Left    unsure if patient has had breast ca, took tamoxifen   BREAST LUMPECTOMY WITH RADIOACTIVE SEED LOCALIZATION Right 04/21/2022   Procedure: RIGHT BREAST LUMPECTOMY WITH RADIOACTIVE SEED LOCALIZATION;  Surgeon: Donnie Mesa, MD;  Location: [facility];  Service: General;  Laterality: Right;  LMA   HUMERUS FRACTURE SURGERY     left lumpectomy     MASS EXCISION Left    left axilla    FAMILY HISTORY: family history includes Breast cancer in her sister; Diabetes in her mother; Pancreatic cancer in her cousin and cousin.  SOCIAL HISTORY:  reports that she has quit smoking. Her smoking use included cigarettes. She has never used smokeless tobacco. She reports that she does not currently use alcohol. She reports that she does not use drugs.  ALLERGIES: Patient has no known allergies.  MEDICATIONS:  Current Outpatient Medications  Medication Sig Dispense Refill   cholecalciferol (VITAMIN D3) 25 MCG (1000 UNIT) tablet Take 1 tablet (1,000 Units total) by mouth daily. 90 tablet 1   ibuprofen (ADVIL) 400 MG tablet Take 400 mg by mouth every 6 (six) hours as needed.     losartan-hydrochlorothiazide (HYZAAR) 100-25 MG tablet Take 1 tablet by mouth daily. 90 tablet 1   omega-3 acid ethyl esters (LOVAZA) 1  g capsule Take 1 capsule (1 g total) by mouth 2 (two) times daily. 60 capsule 3   omeprazole (PRILOSEC) 20 MG capsule TAKE 1 TABLET (20 MG TOTAL) BY MOUTH DAILY. 90 capsule 1   rosuvastatin (CRESTOR) 20 MG tablet  TAKE 1 TABLET (20 MG TOTAL) BY MOUTH DAILY. 90 tablet 3   traZODone (DESYREL) 50 MG tablet Take 0.5-1 tablets (25-50 mg total) by mouth at bedtime as needed for sleep. 30 tablet 3   No current facility-administered medications for this encounter.   Facility-Administered Medications Ordered in Other Encounters  Medication Dose Route Frequency Provider Last Rate Last Admin   bupivacaine liposome (EXPAREL) 1.3 % injection 266 mg  20 mL Infiltration Once Stark Klein, MD        REVIEW OF SYSTEMS: As above in HPI.   PHYSICAL EXAM:  vitals were not taken for this visit.   General: Alert and oriented, in no acute distress HEENT: Head is normocephalic. Extraocular movements are intact. Oropharynx is clear. Neck: Neck is supple, no palpable cervical or supraclavicular lymphadenopathy. Heart: Regular in rate and rhythm with no murmurs, rubs, or gallops. Chest: Clear to auscultation bilaterally, with no rhonchi, wheezes, or rales. Abdomen: Soft, nontender, nondistended, with no rigidity or guarding. Extremities: No cyanosis or edema. Lymphatics: see Neck Exam Skin: No concerning lesions. Musculoskeletal: symmetric strength and muscle tone throughout. Neurologic: Cranial nerves II through XII are grossly intact. No obvious focalities. Speech is fluent. Coordination is intact. Psychiatric: Judgment and insight are intact. Affect is appropriate. Breasts: *** . No other palpable masses appreciated in the breasts or axillae *** .    ECOG = ***  0 - Asymptomatic (Fully active, able to carry on all predisease activities without restriction)  1 - Symptomatic but completely ambulatory (Restricted in physically strenuous activity but ambulatory and able to carry out work of a light or sedentary nature. For example, light housework, office work)  2 - Symptomatic, <50% in bed during the day (Ambulatory and capable of all self care but unable to carry out any work activities. Up and about more than 50%  of waking hours)  3 - Symptomatic, >50% in bed, but not bedbound (Capable of only limited self-care, confined to bed or chair 50% or more of waking hours)  4 - Bedbound (Completely disabled. Cannot carry on any self-care. Totally confined to bed or chair)  5 - Death   Eustace Pen MM, Creech RH, Tormey DC, et al. [phone]). "Toxicity and response criteria of the [facility]". [place] Oncol. 5 (6): 649-55   LABORATORY DATA:  Lab Results  Component Value Date   WBC 7.2 04/29/2022   HGB 12.6 04/29/2022   HCT 38.7 04/29/2022   MCV 81 04/29/2022   PLT 370 04/29/2022   CMP     Component Value Date/Time   NA 138 04/29/2022 1015   K 4.6 04/29/2022 1015   CL 101 04/29/2022 1015   CO2 24 04/29/2022 1015   GLUCOSE 88 04/29/2022 1015   GLUCOSE 126 (H) 02/05/2020 1419   BUN 16 04/29/2022 1015   CREATININE 0.69 04/29/2022 1015   CALCIUM 9.4 04/29/2022 1015   PROT 7.7 04/29/2022 1015   ALBUMIN 4.3 04/29/2022 1015   AST 13 04/29/2022 1015   ALT 12 04/29/2022 1015   ALKPHOS 153 (H) 04/29/2022 1015   BILITOT 0.4 04/29/2022 1015   GFRNONAA 93 03/31/2020 1107   GFRAA 108 03/31/2020 1107         RADIOGRAPHY: MM Breast  Surgical Specimen  Result Date: 04/21/2022 CLINICAL DATA:  Post lumpectomy specimen radiograph EXAM: SPECIMEN RADIOGRAPH OF THE RIGHT BREAST COMPARISON:  Previous exam(s). FINDINGS: Status post excision of the right breast. The radioactive seed and biopsy marker clip are present and completely intact. IMPRESSION: Specimen radiograph of the right breast. These results were called by telephone at the time of interpretation on 04/21/2022 at 2:00 pm to provider MATTHEW TSUEI , who verbally acknowledged these results. Electronically Signed   By: Audie Pinto M.D.   On: 04/21/2022 14:00  MM RT RADIOACTIVE SEED LOC MAMMO GUIDE  Result Date: 04/20/2022 CLINICAL DATA:  Pre surgical excision localization of an area of atypical papillary proliferation in the 9:30  o'clock position of the right breast, marked with a ribbon shaped biopsy marker clip. EXAM: MAMMOGRAPHIC GUIDED RADIOACTIVE SEED LOCALIZATION OF THE RIGHT BREAST COMPARISON:  Previous exam(s). FINDINGS: Patient presents for radioactive seed localization prior to right breast excision. I met with the patient and we discussed the procedure of seed localization including benefits and alternatives. We discussed the high likelihood of a successful procedure. We discussed the risks of the procedure including infection, bleeding, tissue injury and further surgery. We discussed the low dose of radioactivity involved in the procedure. Informed, written consent was given. The usual time-out protocol was performed immediately prior to the procedure. Using mammographic guidance, sterile technique, 1% lidocaine and an I-125 radioactive seed, the recently placed ribbon shaped biopsy marker clip in the 9:30 o'clock position of the right breast was localized using a lateral approach. The follow-up mammogram images confirm the seed in the expected location and were marked for Dr. Georgette Dover. Follow-up survey of the patient confirms presence of the radioactive seed. Order number of I-125 seed:  [id number]. Total activity:  0.236 mCi reference Date: 03/24/2022 The patient tolerated the procedure well and was released from the [place]. She was given instructions regarding seed removal. IMPRESSION: Radioactive seed localization right breast. No apparent complications. Electronically Signed   By: Claudie Revering M.D.   On: 04/20/2022 15:59     IMPRESSION/PLAN: ***   It was a pleasure meeting the patient today. We discussed the risks, benefits, and side effects of radiotherapy. I recommend radiotherapy to the *** to reduce her risk of locoregional recurrence by 2/3.  We discussed that radiation would take approximately *** weeks to complete and that I would give the patient a few weeks to heal following surgery before starting treatment  planning. *** If chemotherapy were to be given, this would precede radiotherapy. We spoke about acute effects including skin irritation and fatigue as well as much less common late effects including internal organ injury or irritation. We spoke about the latest technology that is used to minimize the risk of late effects for patients undergoing radiotherapy to the breast or chest wall. No guarantees of treatment were given. The patient is enthusiastic about proceeding with treatment. I look forward to participating in the patient's care.  I will await her referral back to me for postoperative follow-up and eventual CT simulation/treatment planning.  On date of service, in total, I spent *** minutes on this encounter. Patient was seen in person.   __________________________________________   Eppie Gibson, MD  This document serves as a record of services personally performed by Eppie Gibson, MD. It was created on her behalf by Roney Mans, a trained medical scribe. The creation of this record is based on the scribe's personal observations and the provider's statements to them. This document has been  checked and approved by the attending provider.

## 2022-05-03 ENCOUNTER — Other Ambulatory Visit: Payer: Self-pay

## 2022-05-03 ENCOUNTER — Ambulatory Visit
Admission: RE | Admit: 2022-05-03 | Discharge: 2022-05-03 | Disposition: A | Discharge status: Home / Self Care | Payer: Self-pay | Source: Ambulatory Visit | Attending: Radiation Oncology | Admitting: Radiation Oncology

## 2022-05-03 ENCOUNTER — Ambulatory Visit
Admission: RE | Admit: 2022-05-03 | Discharge: 2022-05-03 | Disposition: A | Discharge status: Home / Self Care | Payer: No Typology Code available for payment source | Source: Ambulatory Visit | Attending: Radiation Oncology | Admitting: Radiation Oncology

## 2022-05-03 ENCOUNTER — Encounter: Payer: Self-pay | Admitting: Radiation Oncology

## 2022-05-03 VITALS — BP 148/70 | HR 81 | Temp 97.0°F | Resp 18 | Ht <= 58 in | Wt 153.4 lb

## 2022-05-03 DIAGNOSIS — K219 Gastro-esophageal reflux disease without esophagitis: Secondary | ICD-10-CM | POA: Insufficient documentation

## 2022-05-03 DIAGNOSIS — E785 Hyperlipidemia, unspecified: Secondary | ICD-10-CM | POA: Insufficient documentation

## 2022-05-03 DIAGNOSIS — Z79810 Long term (current) use of selective estrogen receptor modulators (SERMs): Secondary | ICD-10-CM | POA: Insufficient documentation

## 2022-05-03 DIAGNOSIS — Z803 Family history of malignant neoplasm of breast: Secondary | ICD-10-CM | POA: Insufficient documentation

## 2022-05-03 DIAGNOSIS — C50411 Malignant neoplasm of upper-outer quadrant of right female breast: Secondary | ICD-10-CM

## 2022-05-03 DIAGNOSIS — K760 Fatty (change of) liver, not elsewhere classified: Secondary | ICD-10-CM | POA: Insufficient documentation

## 2022-05-03 DIAGNOSIS — I10 Essential (primary) hypertension: Secondary | ICD-10-CM | POA: Insufficient documentation

## 2022-05-03 DIAGNOSIS — Z170 Estrogen receptor positive status [ER+]: Secondary | ICD-10-CM | POA: Insufficient documentation

## 2022-05-03 DIAGNOSIS — Z87891 Personal history of nicotine dependence: Secondary | ICD-10-CM | POA: Insufficient documentation

## 2022-05-03 DIAGNOSIS — Z79899 Other long term (current) drug therapy: Secondary | ICD-10-CM | POA: Insufficient documentation

## 2022-05-03 LAB — SURGICAL PATHOLOGY

## 2022-05-06 ENCOUNTER — Other Ambulatory Visit: Payer: Self-pay

## 2022-05-10 ENCOUNTER — Other Ambulatory Visit: Payer: Self-pay

## 2022-05-10 ENCOUNTER — Inpatient Hospital Stay: Payer: Self-pay

## 2022-05-10 ENCOUNTER — Encounter: Payer: Self-pay | Admitting: Hematology and Oncology

## 2022-05-10 ENCOUNTER — Inpatient Hospital Stay
Payer: No Typology Code available for payment source | Attending: Hematology and Oncology | Admitting: Hematology and Oncology

## 2022-05-10 VITALS — BP 162/76 | HR 72 | Temp 97.7°F | Resp 18 | Ht <= 58 in | Wt 152.9 lb

## 2022-05-10 DIAGNOSIS — I10 Essential (primary) hypertension: Secondary | ICD-10-CM | POA: Insufficient documentation

## 2022-05-10 DIAGNOSIS — Z800 Family history of malignant neoplasm of digestive organs: Secondary | ICD-10-CM | POA: Insufficient documentation

## 2022-05-10 DIAGNOSIS — Z87891 Personal history of nicotine dependence: Secondary | ICD-10-CM | POA: Insufficient documentation

## 2022-05-10 DIAGNOSIS — C50411 Malignant neoplasm of upper-outer quadrant of right female breast: Secondary | ICD-10-CM | POA: Insufficient documentation

## 2022-05-10 DIAGNOSIS — Z803 Family history of malignant neoplasm of breast: Secondary | ICD-10-CM | POA: Insufficient documentation

## 2022-05-10 DIAGNOSIS — E785 Hyperlipidemia, unspecified: Secondary | ICD-10-CM | POA: Insufficient documentation

## 2022-05-10 DIAGNOSIS — K760 Fatty (change of) liver, not elsewhere classified: Secondary | ICD-10-CM | POA: Insufficient documentation

## 2022-05-10 DIAGNOSIS — K219 Gastro-esophageal reflux disease without esophagitis: Secondary | ICD-10-CM | POA: Insufficient documentation

## 2022-05-10 DIAGNOSIS — Z853 Personal history of malignant neoplasm of breast: Secondary | ICD-10-CM | POA: Insufficient documentation

## 2022-05-10 DIAGNOSIS — Z79899 Other long term (current) drug therapy: Secondary | ICD-10-CM | POA: Insufficient documentation

## 2022-05-10 DIAGNOSIS — Z170 Estrogen receptor positive status [ER+]: Secondary | ICD-10-CM | POA: Insufficient documentation

## 2022-05-10 NOTE — Progress Notes (Signed)
[facility] NOTE  Patient Care Team: [name], NP as PCP - General (Nurse Practitioner)  CHIEF COMPLAINTS/PURPOSE OF CONSULTATION:  Newly diagnosed breast cancer  HISTORY OF PRESENTING ILLNESS:   Maria Dominguez 71 y.o. female is here because of recent diagnosis of right breast IDC.  I reviewed her records extensively and collaborated the history with the patient.  SUMMARY OF ONCOLOGIC HISTORY: Oncology History  Malignant neoplasm of upper-outer quadrant of right female breast ([place])  01/27/2022 Mammogram   Bilateral screening mammogram with possible mass in the right breast. In the left breast, possible masses requiring further evaluation. Diagnostic mammogram persistent oval, spiculated hyperdense mass in the upperouter right breast at posterior depth. Circumscribed masses in the deep central left breast are smaller on today's additional views, similar to multiple prior studies. Targeted ultrasound is performed, showing an oval, circumscribed hypoechoic mass with associated vascularity at the 9:30 position 10 cm from the nipple. It measures 1.2 x 1.0 x 0.9 cm. This corresponds with the mammographic finding. Evaluation of the right axilla demonstrates no suspicious findings.   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  Resection margins negative for carcinoma.  Overall grade 1.  Prior prognostic showed ER 100% strong staining, PR 100% strong staining, Ki-67 of 2% and HER2 of 0   04/28/2022 Initial Diagnosis   Malignant neoplasm of upper-outer quadrant of right female breast [facility])    She arrived today to the appointment with her daughter and certified [place] interpreter.  She has been healing well except for some burning pain in the right breast at the area of surgery.  She mentions history of breast  cancer on the left side about 13 years ago when she had surgery on the left, did not need any adjuvant chemoradiation but took tamoxifen for about 3 years.  She had some endometrial hyperplasia and bleeding complications related to this and hence tamoxifen was discontinued after 3 years.  She also reports breast cancer in her twin sister who died in her 39s, several family members with cancer including pancreatic cancer.  She is otherwise healthy, denies any new complaints.  She will be starting radiation on August 14.  Rest of the pertinent 10 point ROS reviewed and negative  MEDICAL HISTORY:  Past Medical History:  Diagnosis Date   Cancer ([place]) 2010   left breast cancer-lumpectomy   Complication of anesthesia    hard to wake after humerous   Diverticulitis of colon with perforation    Fatty liver    GERD (gastroesophageal reflux disease)    Hx of breast cancer    Hyperlipidemia    Hypertension    Perforated gastric ulcer ([place])     SURGICAL HISTORY: Past Surgical History:  Procedure Laterality Date   BREAST BIOPSY Right 03/16/2022   BREAST EXCISIONAL BIOPSY Left    unsure if patient has had breast ca, took tamoxifen   BREAST LUMPECTOMY WITH RADIOACTIVE SEED LOCALIZATION Right 04/21/2022   Procedure: RIGHT BREAST LUMPECTOMY WITH RADIOACTIVE SEED LOCALIZATION;  Surgeon: Donnie Mesa, MD;  Location: [facility];  Service: General;  Laterality: Right;  LMA   HUMERUS FRACTURE SURGERY     left lumpectomy     MASS EXCISION Left    left axilla    SOCIAL HISTORY: Social History   Socioeconomic History   Marital status: Single  Spouse name: Not on file   Number of children: 2   Years of education: Not on file   Highest education level: 10th grade  Occupational History   Not on file  Tobacco Use   Smoking status: Former    Types: Cigarettes   Smokeless tobacco: Never  Vaping Use   Vaping Use: Never used  Substance and Sexual Activity   Alcohol use: Not  Currently   Drug use: No   Sexual activity: Not Currently    Birth control/protection: Post-menopausal  Other Topics Concern   Not on file  Social History Narrative   Not on file   Social Determinants of Health   Financial Resource Strain: Not on file  Food Insecurity: Food Insecurity Present (03/15/2022)   Hunger Vital Sign    Worried About Running Out of Food in the Last Year: Sometimes true    Ran Out of Food in the Last Year: Sometimes true  Transportation Needs: No Transportation Needs (03/15/2022)   PRAPARE - Hydrologist (Medical): No    Lack of Transportation (Non-Medical): No  Physical Activity: Not on file  Stress: Not on file  Social Connections: Not on file  Intimate Partner Violence: Not on file    FAMILY HISTORY: Family History  Problem Relation Age of Onset   Diabetes Mother    Breast cancer Sister    Pancreatic cancer Cousin    Pancreatic cancer Cousin    Colon cancer Neg Hx    Esophageal cancer Neg Hx    Rectal cancer Neg Hx    Stomach cancer Neg Hx     ALLERGIES:  has No Known Allergies.  MEDICATIONS:  Current Outpatient Medications  Medication Sig Dispense Refill   cholecalciferol (VITAMIN D3) 25 MCG (1000 UNIT) tablet Take 1 tablet (1,000 Units total) by mouth daily. 90 tablet 1   ibuprofen (ADVIL) 400 MG tablet Take 400 mg by mouth every 6 (six) hours as needed.     losartan-hydrochlorothiazide (HYZAAR) 100-25 MG tablet Take 1 tablet by mouth daily. 90 tablet 1   omega-3 acid ethyl esters (LOVAZA) 1 g capsule Take 1 capsule (1 g total) by mouth 2 (two) times daily. 60 capsule 3   omeprazole (PRILOSEC) 20 MG capsule TAKE 1 TABLET (20 MG TOTAL) BY MOUTH DAILY. 90 capsule 1   rosuvastatin (CRESTOR) 20 MG tablet TAKE 1 TABLET (20 MG TOTAL) BY MOUTH DAILY. 90 tablet 3   traZODone (DESYREL) 50 MG tablet Take 0.5-1 tablets (25-50 mg total) by mouth at bedtime as needed for sleep. 30 tablet 3   No current facility-administered  medications for this visit.   Facility-Administered Medications Ordered in Other Visits  Medication Dose Route Frequency Provider Last Rate Last Admin   bupivacaine liposome (EXPAREL) 1.3 % injection 266 mg  20 mL Infiltration Once Stark Klein, MD        REVIEW OF SYSTEMS:   Constitutional: Denies fevers, chills or abnormal night sweats Eyes: Denies blurriness of vision, double vision or watery eyes Ears, nose, mouth, throat, and face: Denies mucositis or sore throat Respiratory: Denies cough, dyspnea or wheezes Cardiovascular: Denies palpitation, chest discomfort or lower extremity swelling Gastrointestinal:  Denies nausea, heartburn or change in bowel habits Skin: Denies abnormal skin rashes Lymphatics: Denies new lymphadenopathy or easy bruising Neurological:Denies numbness, tingling or new weaknesses Behavioral/Psych: Mood is stable, no new changes  Breast: Denies any palpable lumps or discharge All other systems were reviewed with the patient and are negative.  PHYSICAL EXAMINATION: ECOG PERFORMANCE STATUS: 0 - Asymptomatic  Vitals:   05/10/22 1037  BP: (!) 162/76  Pulse: 72  Resp: 18  Temp: 97.7 F (36.5 C)  SpO2: 99%   Filed Weights   05/10/22 1037  Weight: 152 lb 14.4 oz (69.4 kg)    GENERAL:alert, no distress and comfortable SKIN: skin color, texture, turgor are normal, no rashes or significant lesions PSYCH: alert & oriented x 3 with fluent speech NEURO: no focal motor/sensory deficits BREAST right breast lumpectomy scar appears to be healing well.  LABORATORY DATA:  I have reviewed the data as listed Lab Results  Component Value Date   WBC 7.2 04/29/2022   HGB 12.6 04/29/2022   HCT 38.7 04/29/2022   MCV 81 04/29/2022   PLT 370 04/29/2022   Lab Results  Component Value Date   NA 138 04/29/2022   K 4.6 04/29/2022   CL 101 04/29/2022   CO2 24 04/29/2022    RADIOGRAPHIC STUDIES: I have personally reviewed the radiological reports and agreed with  the findings in the report.  ASSESSMENT AND PLAN:   Malignant neoplasm of upper-outer quadrant of right female breast ([place]) This is a very pleasant 71 year old female patient with newly diagnosed right breast upper outer quadrant invasive carcinoma in association with intracystic papillary carcinoma, overall grade 1, ER 100% strong staining PR 100% strong staining, Ki-67 of 2% and HER2 of 0 referred to medical oncology for additional recommendations. Patient denies any new health complaints.  She has been healing well from surgery except for some burning pain in the area of surgical excision.  Physical examination today, lumpectomy scar appears to be healing well. We have discussed briefly about Oncotype however given the overall grade, strong ER/PR staining as well as low proliferation index and since most of her tumor is intracystic papillary carcinoma with only 2 mm of invasive ductal carcinoma, we have agreed to defer it.  I do not believe she will require any adjuvant chemotherapy.  We have discussed about role of antiestrogen therapy.  In the past she had taken tamoxifen and had some endometrial hyperplasia and bleeding complications related to that.  We have today discussed about aromatase inhibitors, mechanism of action, adverse effects including but not limited to hot flashes, arthralgias, bone density loss with time and the need to monitor bone density.  She will start adjuvant antiestrogen therapy after completing radiation.  She will return to clinic in about 8 weeks.  With regards to her family history as well as since her personal history of bilateral breast cancer, recommended genetic testing, referral has been placed.  Patient agreeable.  Encouraged to stay active, eat healthy and continue vitamin D supplementation. She will return to clinic in 8 weeks. Thank you for consulting [place] the care of this patient.  Please do not hesitate to contact us with any additional questions or  concerns.  Total time spent: 60 minutes including history, physical exam, review of records, counseling and coordination of care All questions were answered. The patient knows to call the clinic with any problems, questions or concerns.    Benay Pike, MD 05/10/22

## 2022-05-10 NOTE — Assessment & Plan Note (Signed)
This is a very pleasant 71 year old female patient with newly diagnosed right breast upper outer quadrant invasive carcinoma in association with intracystic papillary carcinoma, overall grade 1, ER 100% strong staining PR 100% strong staining, Ki-67 of 2% and HER2 of 0 referred to medical oncology for additional recommendations. Patient denies any new health complaints.  She has been healing well from surgery except for some burning pain in the area of surgical excision.  Physical examination today, lumpectomy scar appears to be healing well. We have discussed briefly about Oncotype however given the overall grade, strong ER/PR staining as well as low proliferation index and since most of her tumor is intracystic papillary carcinoma with only 2 mm of invasive ductal carcinoma, we have agreed to defer it.  I do not believe she will require any adjuvant chemotherapy.  We have discussed about role of antiestrogen therapy.  In the past she had taken tamoxifen and had some endometrial hyperplasia and bleeding complications related to that.  We have today discussed about aromatase inhibitors, mechanism of action, adverse effects including but not limited to hot flashes, arthralgias, bone density loss with time and the need to monitor bone density.  She will start adjuvant antiestrogen therapy after completing radiation.  She will return to clinic in about 8 weeks.  With regards to her family history as well as since her personal history of bilateral breast cancer, recommended genetic testing, referral has been placed.  Patient agreeable.  Encouraged to stay active, eat healthy and continue vitamin D supplementation. She will return to clinic in 8 weeks. Thank you for consulting [place] the care of this patient.  Please do not hesitate to contact us with any additional questions or concerns.

## 2022-05-12 ENCOUNTER — Encounter: Payer: Self-pay | Admitting: *Deleted

## 2022-05-12 ENCOUNTER — Other Ambulatory Visit: Payer: Self-pay

## 2022-05-23 ENCOUNTER — Other Ambulatory Visit: Payer: Self-pay

## 2022-05-23 ENCOUNTER — Ambulatory Visit
Admission: RE | Admit: 2022-05-23 | Discharge: 2022-05-23 | Disposition: A | Discharge status: Home / Self Care | Payer: Self-pay | Source: Ambulatory Visit | Attending: Radiation Oncology | Admitting: Radiation Oncology

## 2022-05-23 DIAGNOSIS — C50411 Malignant neoplasm of upper-outer quadrant of right female breast: Secondary | ICD-10-CM | POA: Insufficient documentation

## 2022-05-23 DIAGNOSIS — Z510 Encounter for antineoplastic radiation therapy: Secondary | ICD-10-CM | POA: Insufficient documentation

## 2022-05-30 ENCOUNTER — Other Ambulatory Visit: Payer: Self-pay

## 2022-05-30 ENCOUNTER — Ambulatory Visit
Admission: RE | Admit: 2022-05-30 | Discharge: 2022-05-30 | Disposition: A | Discharge status: Home / Self Care | Payer: Self-pay | Source: Ambulatory Visit | Attending: Radiation Oncology | Admitting: Radiation Oncology

## 2022-05-30 ENCOUNTER — Encounter: Payer: Self-pay | Admitting: *Deleted

## 2022-05-30 LAB — RAD ONC ARIA SESSION SUMMARY
Course Elapsed Days: 0
Plan Fractions Treated to Date: 1
Plan Prescribed Dose Per Fraction: 2.67 Gy
Plan Total Fractions Prescribed: 15
Plan Total Prescribed Dose: 40.05 Gy
Reference Point Dosage Given to Date: 2.67 Gy
Reference Point Session Dosage Given: 2.67 Gy
Session Number: 1

## 2022-05-30 MED ORDER — RADIAPLEXRX EX GEL: Freq: Two times a day (BID) | CUTANEOUS | Status: DC

## 2022-05-30 MED ORDER — ALRA NON-METALLIC DEODORANT (RAD-ONC)
1.0000 | Freq: Once | TOPICAL | Status: AC
  Administered 2022-05-30: 1 via TOPICAL

## 2022-05-30 NOTE — Progress Notes (Signed)
Pt here for patient teaching.  Pt given Radiation and You booklet, skin care instructions, Alra deodorant, and Radiaplex gel.  Reviewed areas of pertinence such as fatigue, skin changes, breast tenderness, and breast swelling . Pt able to give teach back of to pat skin and use unscented/gentle soap,apply Sonafine bid, avoid applying anything to skin within 4 hours of treatment, avoid wearing an under wire bra, and to use an electric razor if they must shave. Pt verbalizes understanding of information given and will contact nursing with any questions or concerns.    Patient had an interper  [email or web address]

## 2022-05-31 ENCOUNTER — Other Ambulatory Visit: Payer: Self-pay

## 2022-05-31 ENCOUNTER — Ambulatory Visit
Admission: RE | Admit: 2022-05-31 | Discharge: 2022-05-31 | Disposition: A | Discharge status: Home / Self Care | Payer: Self-pay | Source: Ambulatory Visit | Attending: Radiation Oncology | Admitting: Radiation Oncology

## 2022-05-31 ENCOUNTER — Encounter: Payer: Self-pay | Admitting: *Deleted

## 2022-05-31 LAB — RAD ONC ARIA SESSION SUMMARY
Course Elapsed Days: 1
Plan Fractions Treated to Date: 2
Plan Prescribed Dose Per Fraction: 2.67 Gy
Plan Total Fractions Prescribed: 15
Plan Total Prescribed Dose: 40.05 Gy
Reference Point Dosage Given to Date: 5.34 Gy
Reference Point Session Dosage Given: 2.67 Gy
Session Number: 2

## 2022-06-01 ENCOUNTER — Ambulatory Visit
Admission: RE | Admit: 2022-06-01 | Discharge: 2022-06-01 | Disposition: A | Discharge status: Home / Self Care | Payer: Self-pay | Source: Ambulatory Visit | Attending: Radiation Oncology | Admitting: Radiation Oncology

## 2022-06-01 ENCOUNTER — Other Ambulatory Visit: Payer: Self-pay

## 2022-06-01 LAB — RAD ONC ARIA SESSION SUMMARY
Course Elapsed Days: 2
Plan Fractions Treated to Date: 3
Plan Prescribed Dose Per Fraction: 2.67 Gy
Plan Total Fractions Prescribed: 15
Plan Total Prescribed Dose: 40.05 Gy
Reference Point Dosage Given to Date: 8.01 Gy
Reference Point Session Dosage Given: 2.67 Gy
Session Number: 3

## 2022-06-02 ENCOUNTER — Other Ambulatory Visit: Payer: Self-pay

## 2022-06-02 ENCOUNTER — Ambulatory Visit
Admission: RE | Admit: 2022-06-02 | Discharge: 2022-06-02 | Disposition: A | Discharge status: Home / Self Care | Payer: Self-pay | Source: Ambulatory Visit | Attending: Radiation Oncology | Admitting: Radiation Oncology

## 2022-06-02 LAB — RAD ONC ARIA SESSION SUMMARY
Course Elapsed Days: 3
Plan Fractions Treated to Date: 4
Plan Prescribed Dose Per Fraction: 2.67 Gy
Plan Total Fractions Prescribed: 15
Plan Total Prescribed Dose: 40.05 Gy
Reference Point Dosage Given to Date: 10.68 Gy
Reference Point Session Dosage Given: 2.67 Gy
Session Number: 4

## 2022-06-03 ENCOUNTER — Other Ambulatory Visit: Payer: Self-pay

## 2022-06-03 ENCOUNTER — Encounter: Payer: Self-pay | Admitting: Pharmacist

## 2022-06-03 ENCOUNTER — Ambulatory Visit
Admission: RE | Admit: 2022-06-03 | Discharge: 2022-06-03 | Disposition: A | Discharge status: Home / Self Care | Payer: Self-pay | Source: Ambulatory Visit | Attending: Radiation Oncology | Admitting: Radiation Oncology

## 2022-06-03 ENCOUNTER — Ambulatory Visit: Payer: Self-pay | Attending: Nurse Practitioner | Admitting: Pharmacist

## 2022-06-03 VITALS — BP 122/72

## 2022-06-03 DIAGNOSIS — I10 Essential (primary) hypertension: Secondary | ICD-10-CM

## 2022-06-03 LAB — RAD ONC ARIA SESSION SUMMARY
Course Elapsed Days: 4
Plan Fractions Treated to Date: 5
Plan Prescribed Dose Per Fraction: 2.67 Gy
Plan Total Fractions Prescribed: 15
Plan Total Prescribed Dose: 40.05 Gy
Reference Point Dosage Given to Date: 13.35 Gy
Reference Point Session Dosage Given: 2.67 Gy
Session Number: 5

## 2022-06-03 NOTE — Progress Notes (Signed)
   S:     No chief complaint on file.  Maria Dominguez is a 71 y.o. female who presents for hypertension evaluation, education, and management.  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  Patient was referred and last seen by Primary Care Provider, Geryl Rankins, on 04/29/2022. At that visit, losartan was changed to losartan-HCTZ.   Today, patient arrives in good spirits and presents without assistance.  Denies dizziness, headache, blurred vision, swelling.   Patient reports hypertension is longstanding.   Family/Social history:  Fhx: DM, breast cancer, pancreatic cancer Tobacco: former smoker  Alcohol: none reported   Medication adherence reported. Patient has taken BP medications today.   Current antihypertensives include: losartan-HCTZ 100-25 mg daily  Reported home BP readings:  -Usually in the 110s/70s with a wrist cuff -Denies any hypotension   Patient reported dietary habits:  -Compliant with salt restriction  -Denies excessive intake of caffeine   Patient-reported exercise habits:  -none    O:  Vitals:   06/03/22 1109  BP: 122/72    Last 3 Office BP readings: BP Readings from Last 3 Encounters:  06/03/22 122/72  05/10/22 (!) 162/76  05/03/22 (!) 148/70    BMET    Component Value Date/Time   NA 138 04/29/2022 1015   K 4.6 04/29/2022 1015   CL 101 04/29/2022 1015   CO2 24 04/29/2022 1015   GLUCOSE 88 04/29/2022 1015   GLUCOSE 126 (H) 02/05/2020 1419   BUN 16 04/29/2022 1015   CREATININE 0.69 04/29/2022 1015   CALCIUM 9.4 04/29/2022 1015   GFRNONAA 93 03/31/2020 1107   GFRAA 108 03/31/2020 1107    Renal function: CrCl cannot be calculated (Patient's most recent lab result is older than the maximum 21 days allowed.).  Clinical ASCVD: No  The 10-year ASCVD risk score (Arnett DK, et al., 2019) is: 13.8%   Values used to calculate the score:     Age: 67 years     Sex: Female     Is Non-Hispanic  African American: No     Diabetic: No     Tobacco smoker: No     Systolic Blood Pressure: 704 mmHg     Is BP treated: Yes     HDL Cholesterol: 32 mg/dL     Total Cholesterol: 200 mg/dL  A/P: Hypertension longstanding currently at goal on current medications. BP goal < 130/80 mmHg. Medication adherence appears appropriate.  -Continued current regimen.  -Patient educated on purpose, proper use, and potential adverse effects of losartan-HCTZ.  -F/u labs ordered - none today -Counseled on lifestyle modifications for blood pressure control including reduced dietary sodium, increased exercise, adequate sleep. -Encouraged patient to check BP at home and bring log of readings to next visit. Counseled on proper use of home BP cuff.    Results reviewed and written information provided.    Written patient instructions provided. Patient verbalized understanding of treatment plan.  Total time in face to face counseling 30 minutes.    Follow-up:  Pharmacist . PCP clinic visit in October.   Benard Halsted, PharmD, [place], [place] [phone]

## 2022-06-06 ENCOUNTER — Other Ambulatory Visit: Payer: Self-pay

## 2022-06-06 ENCOUNTER — Ambulatory Visit: Payer: Self-pay

## 2022-06-06 ENCOUNTER — Ambulatory Visit
Admission: RE | Admit: 2022-06-06 | Discharge: 2022-06-06 | Disposition: A | Discharge status: Home / Self Care | Payer: Self-pay | Source: Ambulatory Visit | Attending: Radiation Oncology | Admitting: Radiation Oncology

## 2022-06-06 LAB — RAD ONC ARIA SESSION SUMMARY
Course Elapsed Days: 7
Plan Fractions Treated to Date: 6
Plan Prescribed Dose Per Fraction: 2.67 Gy
Plan Total Fractions Prescribed: 15
Plan Total Prescribed Dose: 40.05 Gy
Reference Point Dosage Given to Date: 16.02 Gy
Reference Point Session Dosage Given: 2.67 Gy
Session Number: 6

## 2022-06-07 ENCOUNTER — Other Ambulatory Visit: Payer: Self-pay

## 2022-06-07 ENCOUNTER — Ambulatory Visit
Admission: RE | Admit: 2022-06-07 | Discharge: 2022-06-07 | Disposition: A | Discharge status: Home / Self Care | Payer: Self-pay | Source: Ambulatory Visit | Attending: Radiation Oncology | Admitting: Radiation Oncology

## 2022-06-07 LAB — RAD ONC ARIA SESSION SUMMARY
Course Elapsed Days: 8
Plan Fractions Treated to Date: 7
Plan Prescribed Dose Per Fraction: 2.67 Gy
Plan Total Fractions Prescribed: 15
Plan Total Prescribed Dose: 40.05 Gy
Reference Point Dosage Given to Date: 18.69 Gy
Reference Point Session Dosage Given: 2.67 Gy
Session Number: 7

## 2022-06-08 ENCOUNTER — Ambulatory Visit
Admission: RE | Admit: 2022-06-08 | Discharge: 2022-06-08 | Disposition: A | Discharge status: Home / Self Care | Payer: Self-pay | Source: Ambulatory Visit | Attending: Radiation Oncology | Admitting: Radiation Oncology

## 2022-06-08 ENCOUNTER — Other Ambulatory Visit: Payer: Self-pay

## 2022-06-08 LAB — RAD ONC ARIA SESSION SUMMARY
Course Elapsed Days: 9
Plan Fractions Treated to Date: 8
Plan Prescribed Dose Per Fraction: 2.67 Gy
Plan Total Fractions Prescribed: 15
Plan Total Prescribed Dose: 40.05 Gy
Reference Point Dosage Given to Date: 21.36 Gy
Reference Point Session Dosage Given: 2.67 Gy
Session Number: 8

## 2022-06-09 ENCOUNTER — Other Ambulatory Visit: Payer: Self-pay

## 2022-06-09 ENCOUNTER — Ambulatory Visit
Admission: RE | Admit: 2022-06-09 | Discharge: 2022-06-09 | Disposition: A | Discharge status: Home / Self Care | Payer: Self-pay | Source: Ambulatory Visit | Attending: Radiation Oncology | Admitting: Radiation Oncology

## 2022-06-09 LAB — RAD ONC ARIA SESSION SUMMARY
Course Elapsed Days: 10
Plan Fractions Treated to Date: 9
Plan Prescribed Dose Per Fraction: 2.67 Gy
Plan Total Fractions Prescribed: 15
Plan Total Prescribed Dose: 40.05 Gy
Reference Point Dosage Given to Date: 24.03 Gy
Reference Point Session Dosage Given: 2.67 Gy
Session Number: 9

## 2022-06-10 ENCOUNTER — Other Ambulatory Visit: Payer: Self-pay

## 2022-06-10 ENCOUNTER — Ambulatory Visit
Admission: RE | Admit: 2022-06-10 | Discharge: 2022-06-10 | Disposition: A | Discharge status: Home / Self Care | Payer: No Typology Code available for payment source | Source: Ambulatory Visit | Attending: Radiation Oncology | Admitting: Radiation Oncology

## 2022-06-10 DIAGNOSIS — Z510 Encounter for antineoplastic radiation therapy: Secondary | ICD-10-CM | POA: Insufficient documentation

## 2022-06-10 DIAGNOSIS — C50411 Malignant neoplasm of upper-outer quadrant of right female breast: Secondary | ICD-10-CM | POA: Insufficient documentation

## 2022-06-10 LAB — RAD ONC ARIA SESSION SUMMARY
Course Elapsed Days: 11
Plan Fractions Treated to Date: 10
Plan Prescribed Dose Per Fraction: 2.67 Gy
Plan Total Fractions Prescribed: 15
Plan Total Prescribed Dose: 40.05 Gy
Reference Point Dosage Given to Date: 26.7 Gy
Reference Point Session Dosage Given: 2.67 Gy
Session Number: 10

## 2022-06-14 ENCOUNTER — Other Ambulatory Visit: Payer: Self-pay

## 2022-06-14 ENCOUNTER — Ambulatory Visit: Payer: No Typology Code available for payment source

## 2022-06-14 ENCOUNTER — Ambulatory Visit
Admission: RE | Admit: 2022-06-14 | Discharge: 2022-06-14 | Disposition: A | Discharge status: Home / Self Care | Payer: No Typology Code available for payment source | Source: Ambulatory Visit | Attending: Radiation Oncology | Admitting: Radiation Oncology

## 2022-06-14 LAB — RAD ONC ARIA SESSION SUMMARY
Course Elapsed Days: 15
Plan Fractions Treated to Date: 11
Plan Prescribed Dose Per Fraction: 2.67 Gy
Plan Total Fractions Prescribed: 15
Plan Total Prescribed Dose: 40.05 Gy
Reference Point Dosage Given to Date: 29.37 Gy
Reference Point Session Dosage Given: 2.67 Gy
Session Number: 11

## 2022-06-15 ENCOUNTER — Other Ambulatory Visit: Payer: Self-pay

## 2022-06-15 ENCOUNTER — Ambulatory Visit
Admission: RE | Admit: 2022-06-15 | Discharge: 2022-06-15 | Disposition: A | Discharge status: Home / Self Care | Payer: No Typology Code available for payment source | Source: Ambulatory Visit | Attending: Radiation Oncology | Admitting: Radiation Oncology

## 2022-06-15 LAB — RAD ONC ARIA SESSION SUMMARY
Course Elapsed Days: 16
Plan Fractions Treated to Date: 12
Plan Prescribed Dose Per Fraction: 2.67 Gy
Plan Total Fractions Prescribed: 15
Plan Total Prescribed Dose: 40.05 Gy
Reference Point Dosage Given to Date: 32.04 Gy
Reference Point Session Dosage Given: 2.67 Gy
Session Number: 12

## 2022-06-16 ENCOUNTER — Other Ambulatory Visit: Payer: Self-pay

## 2022-06-16 ENCOUNTER — Ambulatory Visit
Admission: RE | Admit: 2022-06-16 | Discharge: 2022-06-16 | Disposition: A | Discharge status: Home / Self Care | Payer: No Typology Code available for payment source | Source: Ambulatory Visit | Attending: Radiation Oncology | Admitting: Radiation Oncology

## 2022-06-16 DIAGNOSIS — C50411 Malignant neoplasm of upper-outer quadrant of right female breast: Secondary | ICD-10-CM

## 2022-06-16 LAB — RAD ONC ARIA SESSION SUMMARY
Course Elapsed Days: 17
Plan Fractions Treated to Date: 13
Plan Prescribed Dose Per Fraction: 2.67 Gy
Plan Total Fractions Prescribed: 15
Plan Total Prescribed Dose: 40.05 Gy
Reference Point Dosage Given to Date: 34.71 Gy
Reference Point Session Dosage Given: 2.67 Gy
Session Number: 13

## 2022-06-16 MED ORDER — RADIAPLEXRX EX GEL: Freq: Once | CUTANEOUS | Status: AC

## 2022-06-17 ENCOUNTER — Other Ambulatory Visit: Payer: Self-pay

## 2022-06-17 ENCOUNTER — Ambulatory Visit
Admission: RE | Admit: 2022-06-17 | Discharge: 2022-06-17 | Disposition: A | Discharge status: Home / Self Care | Payer: No Typology Code available for payment source | Source: Ambulatory Visit | Attending: Radiation Oncology | Admitting: Radiation Oncology

## 2022-06-17 LAB — RAD ONC ARIA SESSION SUMMARY
Course Elapsed Days: 18
Plan Fractions Treated to Date: 14
Plan Prescribed Dose Per Fraction: 2.67 Gy
Plan Total Fractions Prescribed: 15
Plan Total Prescribed Dose: 40.05 Gy
Reference Point Dosage Given to Date: 37.38 Gy
Reference Point Session Dosage Given: 2.67 Gy
Session Number: 14

## 2022-06-20 ENCOUNTER — Ambulatory Visit
Admission: RE | Admit: 2022-06-20 | Discharge: 2022-06-20 | Disposition: A | Discharge status: Home / Self Care | Payer: No Typology Code available for payment source | Source: Ambulatory Visit | Attending: Radiation Oncology | Admitting: Radiation Oncology

## 2022-06-20 ENCOUNTER — Other Ambulatory Visit: Payer: Self-pay

## 2022-06-20 ENCOUNTER — Encounter: Payer: Self-pay | Admitting: *Deleted

## 2022-06-20 ENCOUNTER — Ambulatory Visit: Payer: No Typology Code available for payment source

## 2022-06-20 ENCOUNTER — Encounter: Payer: Self-pay | Admitting: Radiation Oncology

## 2022-06-20 DIAGNOSIS — C50411 Malignant neoplasm of upper-outer quadrant of right female breast: Secondary | ICD-10-CM

## 2022-06-20 LAB — RAD ONC ARIA SESSION SUMMARY
Course Elapsed Days: 21
Plan Fractions Treated to Date: 15
Plan Prescribed Dose Per Fraction: 2.67 Gy
Plan Total Fractions Prescribed: 15
Plan Total Prescribed Dose: 40.05 Gy
Reference Point Dosage Given to Date: 40.05 Gy
Reference Point Session Dosage Given: 2.67 Gy
Session Number: 15

## 2022-06-30 ENCOUNTER — Inpatient Hospital Stay: Payer: No Typology Code available for payment source | Attending: Hematology and Oncology | Admitting: Genetic Counselor

## 2022-06-30 ENCOUNTER — Other Ambulatory Visit: Payer: Self-pay | Admitting: Genetic Counselor

## 2022-06-30 ENCOUNTER — Encounter: Payer: Self-pay | Admitting: Genetic Counselor

## 2022-06-30 ENCOUNTER — Inpatient Hospital Stay: Payer: No Typology Code available for payment source

## 2022-06-30 DIAGNOSIS — Z923 Personal history of irradiation: Secondary | ICD-10-CM | POA: Insufficient documentation

## 2022-06-30 DIAGNOSIS — C50411 Malignant neoplasm of upper-outer quadrant of right female breast: Secondary | ICD-10-CM

## 2022-06-30 DIAGNOSIS — Z853 Personal history of malignant neoplasm of breast: Secondary | ICD-10-CM | POA: Insufficient documentation

## 2022-06-30 DIAGNOSIS — Z79811 Long term (current) use of aromatase inhibitors: Secondary | ICD-10-CM | POA: Insufficient documentation

## 2022-06-30 DIAGNOSIS — Z803 Family history of malignant neoplasm of breast: Secondary | ICD-10-CM | POA: Insufficient documentation

## 2022-06-30 DIAGNOSIS — Z170 Estrogen receptor positive status [ER+]: Secondary | ICD-10-CM | POA: Insufficient documentation

## 2022-06-30 HISTORY — DX: Personal history of malignant neoplasm of breast: Z85.3

## 2022-06-30 HISTORY — DX: Family history of malignant neoplasm of breast: Z80.3

## 2022-06-30 NOTE — Progress Notes (Signed)
REFERRING PROVIDER: Benay Pike, MD [place],  [place] [address]  PRIMARY PROVIDER:  [name], NP  PRIMARY REASON FOR VISIT:  Encounter Diagnoses  Name Primary?   Malignant neoplasm of upper-outer quadrant of right female breast, unspecified estrogen receptor status ([place]) Yes   Personal history of breast cancer    Family history of breast cancer     HISTORY OF PRESENT ILLNESS:   Maria Dominguez, a 71 y.o. female, was seen for a [facility] cancer genetics consultation at the request of Dr. Chryl Heck due to a personal and family history of breast cancer.  Maria Dominguez presents to clinic today to discuss the possibility of a hereditary predisposition to cancer, to discuss genetic testing, and to further clarify her future cancer risks, as well as potential cancer risks for family members.   Approximately 14 years ago, in her late 74s, Maria Dominguez was diagnosed with left breast cancer s/p lumpectomy and tamoxifen for 3 years.  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  The treatment plan included lumpectomy, adjuvant radiation, and plans for anti-estrogens.   CANCER HISTORY:  Oncology History  Malignant neoplasm of upper-outer quadrant of right female breast ([place])  01/27/2022 Mammogram   Bilateral screening mammogram with possible mass in the right breast. In the left breast, possible masses requiring further evaluation. Diagnostic mammogram persistent oval, spiculated hyperdense mass in the upperouter right breast at posterior depth. Circumscribed masses in the deep central left breast are smaller on today's additional views, similar to multiple prior studies. Targeted ultrasound is performed, showing an oval, circumscribed hypoechoic mass with associated vascularity at the 9:30 position 10 cm from the nipple. It measures 1.2 x 1.0 x 0.9 cm. This corresponds with the mammographic  finding. Evaluation of the right axilla demonstrates no suspicious findings.   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  Resection margins negative for carcinoma.  Overall grade 1.  Prior prognostic showed ER 100% strong staining, PR 100% strong staining, Ki-67 of 2% and HER2 of 0   04/28/2022 Initial Diagnosis   Malignant neoplasm of upper-outer quadrant of right female breast ([place])      RISK FACTORS:  Colonoscopy: yes;  most recent in 2021; 3 polyps . Hysterectomy: no.  Ovaries intact: yes.  Menarche was at age 1.  First live birth at age 89.  Dermatology screening: no   Past Medical History:  Diagnosis Date   Cancer [facility] [address]) 2010   left breast cancer-lumpectomy   Complication of anesthesia    hard to wake after humerous   Diverticulitis of colon with perforation    Fatty liver    GERD (gastroesophageal reflux disease)    Hx of breast cancer    Hyperlipidemia    Hypertension    Perforated gastric ulcer ([place])     Past Surgical History:  Procedure Laterality Date   BREAST BIOPSY Right 03/16/2022   BREAST EXCISIONAL BIOPSY Left    unsure if patient has had breast ca, took tamoxifen   BREAST LUMPECTOMY WITH RADIOACTIVE SEED LOCALIZATION Right 04/21/2022   Procedure: RIGHT BREAST LUMPECTOMY WITH RADIOACTIVE SEED LOCALIZATION;  Surgeon: Donnie Mesa, MD;  Location: [facility];  Service: General;  Laterality: Right;  LMA   HUMERUS FRACTURE SURGERY  left lumpectomy     MASS EXCISION Left    left axilla    FAMILY HISTORY:  We obtained a detailed, 4-generation family history.  Significant diagnoses are listed below: Family History  Problem Relation Age of Onset   Breast cancer Sister        d. 50s; twin sister (fraternal)   Liver cancer Cousin        double first cousins; x2; d. 40s-50s;  alcohol abuse      Ms. Kolenovic is unaware of previous family history of genetic testing for hereditary cancer risks. Patient's maternal ancestors are of [place] descent, and paternal ancestors are of [place] descent. There is no known consanguinity.  GENETIC COUNSELING ASSESSMENT: Maria Dominguez is a 70 y.o. female with a personal and family history of breast cancer which is somewhat suggestive of a hereditary cancer syndrome and predisposition to cancer given her history of two breast primaries and her sister's history of breast cancer. We, therefore, discussed and recommended the following at today's visit.   DISCUSSION: We discussed that 5 - 10% of cancer is hereditary.  Most cases of hereditary breast cancer are associated with mutations in BRCA1/2.  There are other genes that can be associated with hereditary breast cancer syndromes.  We discussed that testing is beneficial for several reasons including knowing how to follow individuals for their cancer risks and understanding if other family members could be at risk for cancer and allowing them to undergo genetic testing.   We reviewed the characteristics, features and inheritance patterns of hereditary cancer syndromes. We also discussed genetic testing, including the appropriate family members to test, the process of testing, insurance coverage and turn-around-time for results. We discussed the implications of a negative, positive, carrier and/or variant of uncertain significant result. We recommended Maria Dominguez pursue genetic testing for a panel that includes genes associated with breast and other cancers.   The CustomNext-Cancer+RNAinsight panel offered by [facility] includes sequencing and rearrangement analysis for the following 47 genes:  APC, ATM, AXIN2, BARD1, BMPR1A, BRCA1, BRCA2, BRIP1, CDH1, CDK4, CDKN2A, CHEK2, DICER1, EPCAM, GREM1, HOXB13, MEN1, MLH1, MSH2, MSH3, MSH6, MUTYH, NBN, NF1, NF2, NTHL1, PALB2, PMS2, POLD1, POLE, PTEN,  RAD51C, RAD51D, RECQL, RET, SDHA, SDHAF2, SDHB, SDHC, SDHD, SMAD4, SMARCA4, STK11, TP53, TSC1, TSC2, and VHL.  RNA data is routinely analyzed for use in variant interpretation for all genes.   Based on Maria Dominguez's personal history of two breast cancer primaries, she meets medical criteria for genetic testing. [place] testing requested through [facility].    PLAN: After considering the risks, benefits, and limitations, Maria Dominguez provided informed consent to pursue genetic testing and the blood sample was sent to [facility] for analysis of the CustomNext-Cancer +RNA Panel. Results should be available within approximately 3 weeks' time, at which point they will be disclosed by telephone to Maria Dominguez's daughter (Valeria), per Maria Dominguez's request, as will any additional recommendations warranted by these results. Maria Dominguez will receive a summary of her genetic counseling visit and a copy of her results once available. This information will also be available in Epic.   Maria Dominguez's questions were answered to her satisfaction today. Our contact information was provided should additional questions or concerns arise. Thank you for the referral and allowing us to share in the care of your patient.   Cari M. Koerner, MS, LCGC Genetic Counselor [email or web address] (P) [phone]  The patient was seen for a total of 40 minutes in face-to-face genetic counseling.  The patient   was accompanied by her daughter, Maria Dominguez.  [facility] interpreter, Almyra Free, was present. Drs. Lindi Adie and/or Burr Medico were available to discuss this case as needed.    _______________________________________________________________________ For Office Staff:  Number of people involved in session: 2 Was an Intern/ student involved with case: no

## 2022-07-06 NOTE — Progress Notes (Signed)
                                                                                                                                                             Patient Name: SHIFRA SWARTZENTRUBER MRN: [id number] DOB: 1951-04-21 Referring Physician: Donnie Mesa (Profile Not Attached) Date of Service: 06/20/2022 [facility], [place]                                                        End Of Treatment Note  Diagnoses: C50.411-Malignant neoplasm of upper-outer quadrant of right female breast  Cancer Staging:  Cancer Staging  Malignant neoplasm of upper-outer quadrant of right female breast [facility]) Staging form: Breast, AJCC 8th Edition - Pathologic stage from 05/03/2022: Stage Unknown (pT1a, pNX, G1) - Unsigned Stage prefix: Initial diagnosis Histologic grading system: 3 grade system   Intent: Curative  Radiation Treatment Dates: 05/30/2022 through 06/20/2022 Site Technique Total Dose (Gy) Dose per Fx (Gy) Completed Fx Beam Energies  Breast, Right: Breast_R 3D 40.05/40.05 2.67 15/15 10X, 6XFFF   Narrative: The patient tolerated radiation therapy relatively well.   Plan: The patient will follow-up with radiation oncology in 60moand/or prn -----------------------------------  SEppie Gibson MD

## 2022-07-07 ENCOUNTER — Inpatient Hospital Stay (HOSPITAL_BASED_OUTPATIENT_CLINIC_OR_DEPARTMENT_OTHER): Payer: No Typology Code available for payment source | Admitting: Hematology and Oncology

## 2022-07-07 ENCOUNTER — Other Ambulatory Visit: Payer: Self-pay

## 2022-07-07 DIAGNOSIS — Z170 Estrogen receptor positive status [ER+]: Secondary | ICD-10-CM

## 2022-07-07 DIAGNOSIS — C50411 Malignant neoplasm of upper-outer quadrant of right female breast: Secondary | ICD-10-CM

## 2022-07-07 MED ORDER — ANASTROZOLE 1 MG PO TABS
1.0000 mg | ORAL_TABLET | Freq: Every day | ORAL | 3 refills | Status: AC
  Filled 2022-07-07: qty 30, 30d supply, fill #0
  Filled 2022-09-06: qty 30, 30d supply, fill #1
  Filled 2022-10-06 – 2022-10-17 (×3): qty 30, 30d supply, fill #2
  Filled 2023-04-26 – 2023-05-03 (×2): qty 30, 30d supply, fill #3

## 2022-07-07 NOTE — Progress Notes (Signed)
[facility] NOTE  Patient Care Team: [name], NP as PCP - General (Nurse Practitioner) Mauro Kaufmann, RN as Oncology Nurse Navigator Rockwell Germany, RN as Oncology Nurse Navigator Benay Pike, MD as Consulting Physician (Hematology and Oncology) Donnie Mesa, MD as Consulting Physician (General Surgery) Eppie Gibson, MD as Attending Physician (Radiation Oncology)  CHIEF COMPLAINTS/PURPOSE OF CONSULTATION:  Newly diagnosed breast cancer  HISTORY OF PRESENTING ILLNESS:   Maria Dominguez 71 y.o. female is here because of recent diagnosis of right breast IDC.  I reviewed her records extensively and collaborated the history with the patient.  SUMMARY OF ONCOLOGIC HISTORY: Oncology History  Malignant neoplasm of upper-outer quadrant of right female breast ([place])  01/27/2022 Mammogram   Bilateral screening mammogram with possible mass in the right breast. In the left breast, possible masses requiring further evaluation. Diagnostic mammogram persistent oval, spiculated hyperdense mass in the upperouter right breast at posterior depth. Circumscribed masses in the deep central left breast are smaller on today's additional views, similar to multiple prior studies. Targeted ultrasound is performed, showing an oval, circumscribed hypoechoic mass with associated vascularity at the 9:30 position 10 cm from the nipple. It measures 1.2 x 1.0 x 0.9 cm. This corresponds with the mammographic finding. Evaluation of the right axilla demonstrates no suspicious findings.   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  Resection margins negative for carcinoma.  Overall grade 1.  Prior prognostic showed ER 100% strong staining, PR 100% strong staining, Ki-67 of 2% and HER2 of 0   04/28/2022 Initial Diagnosis    Malignant neoplasm of upper-outer quadrant of right female breast [facility])    She mentions history of breast cancer on the left side about 13 years ago when she had surgery on the left, did not need any adjuvant chemoradiation but took tamoxifen for about 3 years.  She had some endometrial hyperplasia and bleeding complications related to this and hence tamoxifen was discontinued after 3 years.  She also reports breast cancer in her twin sister who died in her 59s, several family members with cancer including pancreatic cancer.  She is otherwise healthy, denies any new complaints.  She is here for follow-up after completing adjuvant radiation.  Certified Spanish interpreter present for the visit.  She is willing to try anything other than tamoxifen for antiestrogen therapy.  She tells me that she is healing from radiation, has some aches and pains in the back which again have improved overall.  No concerning complaints today.  MEDICAL HISTORY:  Past Medical History:  Diagnosis Date   Cancer [facility]) 2010   left breast cancer-lumpectomy   Complication of anesthesia    hard to wake after humerous   Diverticulitis of colon with perforation    Family history of breast cancer 06/30/2022   Fatty liver    GERD (gastroesophageal reflux disease)    Hx of breast cancer    Hyperlipidemia    Hypertension    Perforated gastric ulcer ([place])    Personal history of breast cancer 06/30/2022    SURGICAL HISTORY: Past Surgical History:  Procedure Laterality Date   BREAST BIOPSY Right 03/16/2022   BREAST EXCISIONAL BIOPSY Left    unsure if patient has had breast ca, took tamoxifen   BREAST LUMPECTOMY WITH RADIOACTIVE SEED LOCALIZATION Right 04/21/2022   Procedure: RIGHT BREAST LUMPECTOMY WITH RADIOACTIVE SEED  LOCALIZATION;  Surgeon: Donnie Mesa, MD;  Location: [facility];  Service: General;  Laterality: Right;  LMA   HUMERUS FRACTURE SURGERY     left lumpectomy     MASS EXCISION Left    left  axilla    SOCIAL HISTORY: Social History   Socioeconomic History   Marital status: Single    Spouse name: Not on file   Number of children: 2   Years of education: Not on file   Highest education level: 10th grade  Occupational History   Not on file  Tobacco Use   Smoking status: Former    Types: Cigarettes   Smokeless tobacco: Never  Vaping Use   Vaping Use: Never used  Substance and Sexual Activity   Alcohol use: Not Currently   Drug use: No   Sexual activity: Not Currently    Birth control/protection: Post-menopausal  Other Topics Concern   Not on file  Social History Narrative   Not on file   Social Determinants of Health   Financial Resource Strain: Not on file  Food Insecurity: Food Insecurity Present (03/15/2022)   Hunger Vital Sign    Worried About Running Out of Food in the Last Year: Sometimes true    Ran Out of Food in the Last Year: Sometimes true  Transportation Needs: No Transportation Needs (03/15/2022)   PRAPARE - Hydrologist (Medical): No    Lack of Transportation (Non-Medical): No  Physical Activity: Not on file  Stress: Not on file  Social Connections: Not on file  Intimate Partner Violence: Not on file    FAMILY HISTORY: Family History  Problem Relation Age of Onset   Diabetes Mother    Breast cancer Sister        d. 18s; twin sister (fraternal)   Liver cancer Cousin        double first cousins; x2; d. 54s-50s; alcohol abuse   Colon cancer Neg Hx    Esophageal cancer Neg Hx    Rectal cancer Neg Hx    Stomach cancer Neg Hx     ALLERGIES:  has No Known Allergies.  MEDICATIONS:  Current Outpatient Medications  Medication Sig Dispense Refill   anastrozole (ARIMIDEX) 1 MG tablet Take 1 tablet (1 mg total) by mouth daily. 90 tablet 3   cholecalciferol (VITAMIN D3) 25 MCG (1000 UNIT) tablet Take 1 tablet (1,000 Units total) by mouth daily. 90 tablet 1   ibuprofen (ADVIL) 400 MG tablet Take 400 mg by mouth every  6 (six) hours as needed.     losartan-hydrochlorothiazide (HYZAAR) 100-25 MG tablet Take 1 tablet by mouth daily. 90 tablet 1   omega-3 acid ethyl esters (LOVAZA) 1 g capsule Take 1 capsule (1 g total) by mouth 2 (two) times daily. 60 capsule 3   omeprazole (PRILOSEC) 20 MG capsule TAKE 1 TABLET (20 MG TOTAL) BY MOUTH DAILY. 90 capsule 1   rosuvastatin (CRESTOR) 20 MG tablet TAKE 1 TABLET (20 MG TOTAL) BY MOUTH DAILY. 90 tablet 3   traZODone (DESYREL) 50 MG tablet Take 0.5-1 tablets (25-50 mg total) by mouth at bedtime as needed for sleep. 30 tablet 3   No current facility-administered medications for this visit.   Facility-Administered Medications Ordered in Other Visits  Medication Dose Route Frequency Provider Last Rate Last Admin   bupivacaine liposome (EXPAREL) 1.3 % injection 266 mg  20 mL Infiltration Once Stark Klein, MD        REVIEW OF SYSTEMS:  Constitutional: Denies fevers, chills or abnormal night sweats Eyes: Denies blurriness of vision, double vision or watery eyes Ears, nose, mouth, throat, and face: Denies mucositis or sore throat Respiratory: Denies cough, dyspnea or wheezes Cardiovascular: Denies palpitation, chest discomfort or lower extremity swelling Gastrointestinal:  Denies nausea, heartburn or change in bowel habits Skin: Denies abnormal skin rashes Lymphatics: Denies new lymphadenopathy or easy bruising Neurological:Denies numbness, tingling or new weaknesses Behavioral/Psych: Mood is stable, no new changes  Breast: Denies any palpable lumps or discharge All other systems were reviewed with the patient and are negative.  PHYSICAL EXAMINATION: ECOG PERFORMANCE STATUS: 0 - Asymptomatic  Vitals:   07/07/22 1344  BP: 133/77  Pulse: 85  Resp: 16  Temp: (!) 97.3 F (36.3 C)  SpO2: 98%   Filed Weights   07/07/22 1344  Weight: 153 lb 14.4 oz (69.8 kg)    GENERAL:alert, no distress and comfortable  LABORATORY DATA:  I have reviewed the data as  listed Lab Results  Component Value Date   WBC 7.2 04/29/2022   HGB 12.6 04/29/2022   HCT 38.7 04/29/2022   MCV 81 04/29/2022   PLT 370 04/29/2022   Lab Results  Component Value Date   NA 138 04/29/2022   K 4.6 04/29/2022   CL 101 04/29/2022   CO2 24 04/29/2022    RADIOGRAPHIC STUDIES: I have personally reviewed the radiological reports and agreed with the findings in the report.  ASSESSMENT AND PLAN:   Malignant neoplasm of upper-outer quadrant of right female breast ([place]) This is a very pleasant 71 year old female patient with newly diagnosed right breast upper outer quadrant invasive carcinoma in association with intracystic papillary carcinoma, overall grade 1, ER 100% strong staining PR 100% strong staining, Ki-67 of 2% and HER2 of 0 referred to medical oncology for additional recommendations. She is now status post adjuvant radiation.  She had taken tamoxifen in the past and had endometrial hyperplasia and bleeding complications.  We have today discussed about trying aromatase inhibitors.  We have previously and today discussed about adverse effects from aromatase inhibitors including but not limited to postmenopausal symptoms such as hot flashes, vaginal dryness, arthralgias, bone loss.  She will need a baseline bone density.  She will also have to continue the vitamin D supplementation and stay active.  She will return to clinic in 3 months for survivorship visit.  Return to clinic in 6 months with me or sooner as needed.  We plan to continue antiestrogen therapy for 5 years at least.   Total time spent: 30 minutes including history, review of records, counseling and coordination of care All questions were answered. The patient knows to call the clinic with any problems, questions or concerns.    Benay Pike, MD 07/07/22

## 2022-07-07 NOTE — Assessment & Plan Note (Addendum)
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 She is now status post adjuvant radiation.  She had taken tamoxifen in the past and had endometrial hyperplasia and bleeding complications.  We have today discussed about trying aromatase inhibitors.  We have previously and today discussed about adverse effects from aromatase inhibitors including but not limited to postmenopausal symptoms such as hot flashes, vaginal dryness, arthralgias, bone loss.  She will need a baseline bone density.  She will also have to continue the vitamin D supplementation and stay active.  She will return to clinic in 3 months for survivorship visit.  Return to clinic in 6 months with me or sooner as needed.  We plan to continue antiestrogen therapy for 5 years at least.

## 2022-07-07 NOTE — Progress Notes (Signed)
[facility] NOTE  Patient Care Team: [name], NP as PCP - General (Nurse Practitioner) Mauro Kaufmann, RN as Oncology Nurse Navigator Rockwell Germany, RN as Oncology Nurse Navigator Benay Pike, MD as Consulting Physician (Hematology and Oncology) Donnie Mesa, MD as Consulting Physician (General Surgery) Eppie Gibson, MD as Attending Physician (Radiation Oncology)  CHIEF COMPLAINTS/PURPOSE OF CONSULTATION:  Newly diagnosed breast cancer  HISTORY OF PRESENTING ILLNESS:   Maria Dominguez 71 y.o. female is here because of recent diagnosis of right breast IDC.  I reviewed her records extensively and collaborated the history with the patient.  SUMMARY OF ONCOLOGIC HISTORY: Oncology History  Malignant neoplasm of upper-outer quadrant of right female breast ([place])  01/27/2022 Mammogram   Bilateral screening mammogram with possible mass in the right breast. In the left breast, possible masses requiring further evaluation. Diagnostic mammogram persistent oval, spiculated hyperdense mass in the upperouter right breast at posterior depth. Circumscribed masses in the deep central left breast are smaller on today's additional views, similar to multiple prior studies. Targeted ultrasound is performed, showing an oval, circumscribed hypoechoic mass with associated vascularity at the 9:30 position 10 cm from the nipple. It measures 1.2 x 1.0 x 0.9 cm. This corresponds with the mammographic finding. Evaluation of the right axilla demonstrates no suspicious findings.   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  Resection margins negative for carcinoma.  Overall grade 1.  Prior prognostic showed ER 100% strong staining, PR 100% strong staining, Ki-67 of 2% and HER2 of 0   04/28/2022 Initial Diagnosis    Malignant neoplasm of upper-outer quadrant of right female breast [facility])    She arrived today to the appointment with her daughter and certified [place] interpreter.  She has been healing well except for some burning pain in the right breast at the area of surgery.  She mentions history of breast cancer on the left side about 13 years ago when she had surgery on the left, did not need any adjuvant chemoradiation but took tamoxifen for about 3 years.  She had some endometrial hyperplasia and bleeding complications related to this and hence tamoxifen was discontinued after 3 years.  She also reports breast cancer in her twin sister who died in her 7s, several family members with cancer including pancreatic cancer.  She is otherwise healthy, denies any new complaints.  She will be starting radiation on August 14.  Rest of the pertinent 10 point ROS reviewed and negative  MEDICAL HISTORY:  Past Medical History:  Diagnosis Date   Cancer ([place]) 2010   left breast cancer-lumpectomy   Complication of anesthesia    hard to wake after humerous   Diverticulitis of colon with perforation    Family history of breast cancer 06/30/2022   Fatty liver    GERD (gastroesophageal reflux disease)    Hx of breast cancer    Hyperlipidemia    Hypertension    Perforated gastric ulcer ([place])    Personal history of breast cancer 06/30/2022    SURGICAL HISTORY: Past Surgical History:  Procedure Laterality Date   BREAST BIOPSY Right 03/16/2022   BREAST EXCISIONAL BIOPSY Left    unsure if patient has had breast ca, took tamoxifen   BREAST LUMPECTOMY WITH RADIOACTIVE SEED LOCALIZATION Right 04/21/2022   Procedure: RIGHT BREAST LUMPECTOMY WITH RADIOACTIVE SEED LOCALIZATION;  Surgeon: Donnie Mesa,  MD;  Location: [place] [facility];  Service: General;  Laterality: Right;  LMA   HUMERUS FRACTURE SURGERY     left lumpectomy     MASS EXCISION Left    left axilla    SOCIAL HISTORY: Social History    Socioeconomic History   Marital status: Single    Spouse name: Not on file   Number of children: 2   Years of education: Not on file   Highest education level: 10th grade  Occupational History   Not on file  Tobacco Use   Smoking status: Former    Types: Cigarettes   Smokeless tobacco: Never  Vaping Use   Vaping Use: Never used  Substance and Sexual Activity   Alcohol use: Not Currently   Drug use: No   Sexual activity: Not Currently    Birth control/protection: Post-menopausal  Other Topics Concern   Not on file  Social History Narrative   Not on file   Social Determinants of Health   Financial Resource Strain: Not on file  Food Insecurity: Food Insecurity Present (03/15/2022)   Hunger Vital Sign    Worried About Running Out of Food in the Last Year: Sometimes true    Ran Out of Food in the Last Year: Sometimes true  Transportation Needs: No Transportation Needs (03/15/2022)   PRAPARE - Hydrologist (Medical): No    Lack of Transportation (Non-Medical): No  Physical Activity: Not on file  Stress: Not on file  Social Connections: Not on file  Intimate Partner Violence: Not on file    FAMILY HISTORY: Family History  Problem Relation Age of Onset   Diabetes Mother    Breast cancer Sister        d. 55s; twin sister (fraternal)   Liver cancer Cousin        double first cousins; x2; d. 29s-50s; alcohol abuse   Colon cancer Neg Hx    Esophageal cancer Neg Hx    Rectal cancer Neg Hx    Stomach cancer Neg Hx     ALLERGIES:  has No Known Allergies.  MEDICATIONS:  Current Outpatient Medications  Medication Sig Dispense Refill   anastrozole (ARIMIDEX) 1 MG tablet Take 1 tablet (1 mg total) by mouth daily. 90 tablet 3   cholecalciferol (VITAMIN D3) 25 MCG (1000 UNIT) tablet Take 1 tablet (1,000 Units total) by mouth daily. 90 tablet 1   ibuprofen (ADVIL) 400 MG tablet Take 400 mg by mouth every 6 (six) hours as needed.      losartan-hydrochlorothiazide (HYZAAR) 100-25 MG tablet Take 1 tablet by mouth daily. 90 tablet 1   omega-3 acid ethyl esters (LOVAZA) 1 g capsule Take 1 capsule (1 g total) by mouth 2 (two) times daily. 60 capsule 3   omeprazole (PRILOSEC) 20 MG capsule TAKE 1 TABLET (20 MG TOTAL) BY MOUTH DAILY. 90 capsule 1   rosuvastatin (CRESTOR) 20 MG tablet TAKE 1 TABLET (20 MG TOTAL) BY MOUTH DAILY. 90 tablet 3   traZODone (DESYREL) 50 MG tablet Take 0.5-1 tablets (25-50 mg total) by mouth at bedtime as needed for sleep. 30 tablet 3   No current facility-administered medications for this visit.   Facility-Administered Medications Ordered in Other Visits  Medication Dose Route Frequency Provider Last Rate Last Admin   bupivacaine liposome (EXPAREL) 1.3 % injection 266 mg  20 mL Infiltration Once Stark Klein, MD        REVIEW OF SYSTEMS:   Constitutional: Denies fevers, chills  or abnormal night sweats Eyes: Denies blurriness of vision, double vision or watery eyes Ears, nose, mouth, throat, and face: Denies mucositis or sore throat Respiratory: Denies cough, dyspnea or wheezes Cardiovascular: Denies palpitation, chest discomfort or lower extremity swelling Gastrointestinal:  Denies nausea, heartburn or change in bowel habits Skin: Denies abnormal skin rashes Lymphatics: Denies new lymphadenopathy or easy bruising Neurological:Denies numbness, tingling or new weaknesses Behavioral/Psych: Mood is stable, no new changes  Breast: Denies any palpable lumps or discharge All other systems were reviewed with the patient and are negative.  PHYSICAL EXAMINATION: ECOG PERFORMANCE STATUS: 0 - Asymptomatic  Vitals:   07/07/22 1344  BP: 133/77  Pulse: 85  Resp: 16  Temp: (!) 97.3 F (36.3 C)  SpO2: 98%   Filed Weights   07/07/22 1344  Weight: 153 lb 14.4 oz (69.8 kg)    GENERAL:alert, no distress and comfortable SKIN: skin color, texture, turgor are normal, no rashes or significant  lesions PSYCH: alert & oriented x 3 with fluent speech NEURO: no focal motor/sensory deficits BREAST right breast lumpectomy scar appears to be healing well.  LABORATORY DATA:  I have reviewed the data as listed Lab Results  Component Value Date   WBC 7.2 04/29/2022   HGB 12.6 04/29/2022   HCT 38.7 04/29/2022   MCV 81 04/29/2022   PLT 370 04/29/2022   Lab Results  Component Value Date   NA 138 04/29/2022   K 4.6 04/29/2022   CL 101 04/29/2022   CO2 24 04/29/2022    RADIOGRAPHIC STUDIES: I have personally reviewed the radiological reports and agreed with the findings in the report.  ASSESSMENT AND PLAN:   Malignant neoplasm of upper-outer quadrant of right female breast ([place]) This is a very pleasant 71 year old female patient with newly diagnosed right breast upper outer quadrant invasive carcinoma in association with intracystic papillary carcinoma, overall grade 1, ER 100% strong staining PR 100% strong staining, Ki-67 of 2% and HER2 of 0 referred to medical oncology for additional recommendations.   Total time spent: 60 minutes including history, physical exam, review of records, counseling and coordination of care All questions were answered. The patient knows to call the clinic with any problems, questions or concerns.    Benay Pike, MD 07/07/22

## 2022-07-13 ENCOUNTER — Telehealth: Payer: Self-pay | Admitting: Genetic Counselor

## 2022-07-13 ENCOUNTER — Ambulatory Visit: Payer: Self-pay | Admitting: Genetic Counselor

## 2022-07-13 ENCOUNTER — Encounter: Payer: Self-pay | Admitting: Genetic Counselor

## 2022-07-13 DIAGNOSIS — Z803 Family history of malignant neoplasm of breast: Secondary | ICD-10-CM

## 2022-07-13 DIAGNOSIS — Z1379 Encounter for other screening for genetic and chromosomal anomalies: Secondary | ICD-10-CM

## 2022-07-13 DIAGNOSIS — Z170 Estrogen receptor positive status [ER+]: Secondary | ICD-10-CM

## 2022-07-13 DIAGNOSIS — Z853 Personal history of malignant neoplasm of breast: Secondary | ICD-10-CM

## 2022-07-13 NOTE — Telephone Encounter (Signed)
Revealed negative genetics to daughter [name] with [employer]pre[employer] [phone].

## 2022-07-13 NOTE — Progress Notes (Signed)
HPI:   Ms. Gaccione was previously seen in the [place] clinic due to a personal and family history of breast cancer and concerns regarding a hereditary predisposition to cancer. Please refer to our prior cancer genetics clinic note for more information regarding our discussion, assessment and recommendations, at the time. Ms. Ringgold recent genetic test results were disclosed to her daughter, [name] (per Ms. Livermore's request), as were recommendations warranted by these results. These results and recommendations are discussed in more detail below.  CANCER HISTORY:  Oncology History  Malignant neoplasm of upper-outer quadrant of right female breast ([place])  01/27/2022 Mammogram   Bilateral screening mammogram with possible mass in the right breast. In the left breast, possible masses requiring further evaluation. Diagnostic mammogram persistent oval, spiculated hyperdense mass in the upperouter right breast at posterior depth. Circumscribed masses in the deep central left breast are smaller on today's additional views, similar to multiple prior studies. Targeted ultrasound is performed, showing an oval, circumscribed hypoechoic mass with associated vascularity at the 9:30 position 10 cm from the nipple. It measures 1.2 x 1.0 x 0.9 cm. This corresponds with the mammographic finding. Evaluation of the right axilla demonstrates no suspicious findings.   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  Resection margins negative for carcinoma.  Overall grade 1.  Prior prognostic showed ER 100% strong staining, PR 100% strong staining, Ki-67 of 2% and HER2 of 0   04/28/2022 Initial Diagnosis   Malignant neoplasm of upper-outer quadrant of right female breast ([place])   07/09/2022 Genetic Testing   Negative hereditary cancer genetic testing: no  pathogenic variants detected in [facility] CustomNext-Cancer +RNAinsight Panel.  Report date is July 09, 2022.   Negative hereditary cancer genetic testing: no pathogenic variants detected in [facility] CustomNext-Cancer +RNAinsight Panel.  Report date is July 09, 2022.   The CustomNext-Cancer+RNAinsight panel offered by [facility] includes sequencing and rearrangement analysis for the following 47 genes:  APC, ATM, AXIN2, BARD1, BMPR1A, BRCA1, BRCA2, BRIP1, CDH1, CDK4, CDKN2A, CHEK2, DICER1, EPCAM, GREM1, HOXB13, MEN1, MLH1, MSH2, MSH3, MSH6, MUTYH, NBN, NF1, NF2, NTHL1, PALB2, PMS2, POLD1, POLE, PTEN, RAD51C, RAD51D, RECQL, RET, SDHA, SDHAF2, SDHB, SDHC, SDHD, SMAD4, SMARCA4, STK11, TP53, TSC1, TSC2, and VHL.  RNA data is routinely analyzed for use in variant interpretation for all genes.     FAMILY HISTORY:  We obtained a detailed, 4-generation family history.  Significant diagnoses are listed below:      Family History  Problem Relation Age of Onset   Breast cancer Sister          d. 67s; twin sister (fraternal)   Liver cancer Cousin          double first cousins; x2; d. 37s-50s; alcohol abuse         Ms. Detert is unaware of previous family history of genetic testing for hereditary cancer risks. Patient's maternal ancestors are of [place] descent, and paternal ancestors are of [place] descent. There is no known consanguinity.  GENETIC TEST RESULTS:  The [facility] CustomNext-Cancer +RNAinsight Panel found no pathogenic mutations.   The CustomNext-Cancer+RNAinsight panel offered by [facility] includes sequencing and rearrangement analysis for the following 47 genes:  APC, ATM, AXIN2, BARD1, BMPR1A, BRCA1, BRCA2, BRIP1, CDH1, CDK4, CDKN2A, CHEK2, DICER1, EPCAM, GREM1, HOXB13, MEN1, MLH1, MSH2, MSH3, MSH6, MUTYH, NBN, NF1, NF2, NTHL1, PALB2, PMS2, POLD1, POLE, PTEN,  RAD51C, RAD51D, RECQL, RET, SDHA, SDHAF2, SDHB, SDHC, SDHD, SMAD4, SMARCA4, STK11, TP53, TSC1, TSC2, and VHL.  RNA  data is routinely analyzed for use in variant interpretation for all genes.  The test report has been scanned into EPIC and is located under the Molecular Pathology section of the Results Review tab.  A portion of the result report is included below for reference. Genetic testing reported out on July 09, 2022.       Even though a pathogenic variant was not identified, possible explanations for the cancer in the family may include: There may be no hereditary risk for cancer in the family. The cancers in Ms. Ramanathan and/or her family may be sporadic/familial or due to other genetic and environmental factors. There may be a gene mutation in one of these genes that current testing methods cannot detect but that chance is small. There could be another gene that has not yet been discovered, or that we have not yet tested, that is responsible for the cancer diagnoses in the family.   Therefore, it is important to remain in touch with cancer genetics in the future so that we can continue to offer Ms. Provencher the most up to date genetic testing.    ADDITIONAL GENETIC TESTING:  Ms. Kato genetic testing was fairly extensive.  If there are additional relevant genes identified to increase cancer risk that can be analyzed in the future, we would be happy to discuss and coordinate this testing at that time.     CANCER SCREENING RECOMMENDATIONS:  Ms. Doswell test result is considered negative (normal).  This means that we have not identified a hereditary cause for her personal history of breast cancer at this time.   An individual's cancer risk and medical management are not determined by genetic test results alone. Overall cancer risk assessment incorporates additional factors, including personal medical history, family history, and any available genetic information that may result in a personalized plan for cancer prevention and surveillance. Therefore, it is recommended she continue to follow the  cancer management and screening guidelines provided by her oncology and primary healthcare provider.  RECOMMENDATIONS FOR FAMILY MEMBERS:   Since she did not inherit a identifiable mutation in a cancer predisposition gene included on this panel, her children could not have inherited a known mutation from her in one of these genes. Individuals in this family might be at some increased risk of developing cancer, over the general population risk, due to the family history of cancer.  Individuals in the family should notify their providers of the family history of cancer. We recommend women in this family have a yearly mammogram beginning at age 59, or 81 years younger than the earliest onset of cancer, an annual clinical breast exam, and perform monthly breast self-exams.     FOLLOW-UP:  Lastly, we discussed with Ms. Eckert that cancer genetics is a rapidly advancing field and it is possible that new genetic tests will be appropriate for her and/or her family members in the future. We encouraged her to remain in contact with cancer genetics on an annual basis so we can update her personal and family histories and let her know of advances in cancer genetics that may benefit this family.   Our contact number was provided. Ms. Barnfield questions were answered to her satisfaction, and she knows she is welcome to call us at anytime with additional questions or concerns.   Aarian Cleaver M. Joette Catching, [place], [facility] Genetic Counselor [email or web address] (P) [phone]

## 2022-07-19 ENCOUNTER — Encounter: Payer: Self-pay | Admitting: *Deleted

## 2022-07-22 ENCOUNTER — Ambulatory Visit
Admission: RE | Admit: 2022-07-22 | Discharge: 2022-07-22 | Disposition: A | Discharge status: Home / Self Care | Payer: Self-pay | Source: Ambulatory Visit | Attending: Radiation Oncology | Admitting: Radiation Oncology

## 2022-07-22 VITALS — BP 152/62 | HR 64 | Temp 97.6°F | Resp 20 | Ht <= 58 in | Wt 154.4 lb

## 2022-07-22 DIAGNOSIS — Z923 Personal history of irradiation: Secondary | ICD-10-CM | POA: Insufficient documentation

## 2022-07-22 DIAGNOSIS — Z170 Estrogen receptor positive status [ER+]: Secondary | ICD-10-CM | POA: Insufficient documentation

## 2022-07-22 DIAGNOSIS — C50411 Malignant neoplasm of upper-outer quadrant of right female breast: Secondary | ICD-10-CM | POA: Insufficient documentation

## 2022-07-22 DIAGNOSIS — Z79899 Other long term (current) drug therapy: Secondary | ICD-10-CM | POA: Insufficient documentation

## 2022-07-22 DIAGNOSIS — Z79811 Long term (current) use of aromatase inhibitors: Secondary | ICD-10-CM | POA: Insufficient documentation

## 2022-07-22 NOTE — Progress Notes (Signed)
Radiation Oncology         [phone] ________________________________  Name: Maria Dominguez MRN: [id number]  Date: 07/22/2022  DOB: [birth year]  Follow-Up Visit Note  Outpatient  CC: Maria Pounds, NP  Maria Mesa, MD  Diagnosis and Prior Radiotherapy:    ICD-10-CM   1. Malignant neoplasm of upper-outer quadrant of right breast in female, estrogen receptor positive ([place])  C50.411    Z17.0       Radiation Treatment Dates: 05/30/2022 through 06/20/2022 Site Technique Total Dose (Gy) Dose per Fx (Gy) Completed Fx Beam Energies  Breast, Right: Breast_R 3D 40.05/40.05 2.67 15/15 10X, 6XFFF     CHIEF COMPLAINT: Here for follow-up and surveillance of breast cancer  Narrative:  The patient returns today for routine follow-up.  Maria Dominguez presents today for follow-up after completing radiation to her right breast on 06/20/2022  Pain: Reports occasional sharp pains to the breast (similar to after her lumpectomy) Skin: Reports skin is intact and well healed ROM: Denies any issues or concerns Lymphedema: Denies MedOnc F/U: Saw Dr. Chryl Dominguez on 07/07/2022 and will see Maria Dominguez in the [facility] clinic in December Other issues of note: Doing well. Does report some muscle/bone discomfort that her PCP believes may be related to her cholesterol medication. Scheduled for F/U with PCP 08/01/22. Looking forward to celebrating her birthday on Monday  Pt reports Yes No Comments  Tamoxifen '[]'$  '[x]'$    Letrozole '[]'$  '[x]'$    Anastrazole '[]'$  '[x]'$  Not taking yet due to concern about side effects  Mammogram '[x]'$  Date: TBD '[]'$                                  ALLERGIES:  has No Known Allergies.  Meds: Current Outpatient Medications  Medication Sig Dispense Refill   anastrozole (ARIMIDEX) 1 MG tablet Take 1 tablet (1 mg total) by mouth daily. 90 tablet 3   cholecalciferol (VITAMIN D3) 25 MCG (1000 UNIT) tablet Take 1 tablet (1,000 Units total) by mouth daily. 90 tablet 1    ibuprofen (ADVIL) 400 MG tablet Take 400 mg by mouth every 6 (six) hours as needed.     losartan-hydrochlorothiazide (HYZAAR) 100-25 MG tablet Take 1 tablet by mouth daily. 90 tablet 1   omega-3 acid ethyl esters (LOVAZA) 1 g capsule Take 1 capsule (1 g total) by mouth 2 (two) times daily. 60 capsule 3   omeprazole (PRILOSEC) 20 MG capsule TAKE 1 TABLET (20 MG TOTAL) BY MOUTH DAILY. 90 capsule 1   rosuvastatin (CRESTOR) 20 MG tablet TAKE 1 TABLET (20 MG TOTAL) BY MOUTH DAILY. 90 tablet 3   traZODone (DESYREL) 50 MG tablet Take 0.5-1 tablets (25-50 mg total) by mouth at bedtime as needed for sleep. 30 tablet 3   No current facility-administered medications for this encounter.   Facility-Administered Medications Ordered in Other Encounters  Medication Dose Route Frequency Provider Last Rate Last Admin   bupivacaine liposome (EXPAREL) 1.3 % injection 266 mg  20 mL Infiltration Once Stark Klein, MD        Physical Findings: The patient is in no acute distress. Patient is alert and oriented.  height is '4\' 7"'$  (1.397 m) and weight is 154 lb 6.4 oz (70 kg). Her temperature is 97.6 F (36.4 C). Her blood pressure is 152/62 (abnormal) and her pulse is 64. Her respiration is 20 and oxygen saturation is 100%. .    Satisfactory skin healing in radiotherapy  fields.  Hyperpigmentation and dryness have healed very nicely   Lab Findings: Lab Results  Component Value Date   WBC 7.2 04/29/2022   HGB 12.6 04/29/2022   HCT 38.7 04/29/2022   MCV 81 04/29/2022   PLT 370 04/29/2022    Radiographic Findings: No results found.  Impression/Plan: Healing well from radiotherapy to the breast tissue.  Continue skin care with topical Vitamin E Oil and / or lotion for at least 2 more months for further healing.  I encouraged her to continue with yearly mammography as appropriate (for intact breast tissue) and followup with medical oncology. I will see her back on an as-needed basis. I have encouraged her to  call if she has any issues or concerns in the future. I wished her the very best.  As she has not yet started her antiestrogen medication due to potential side effects that she read about, I recommended that she talk to her medical oncologist so that she can address these concerns.  On date of service, in total, I spent 25 minutes on this encounter. Patient was seen in person with translator.  _____________________________________   Eppie Gibson, MD

## 2022-07-22 NOTE — Congregational Nurse Program (Signed)
  Dept: [phone]   Congregational Nurse Program Note  Date of Encounter: 07/19/2022  Past Medical History: Past Medical History:  Diagnosis Date   Cancer [facility]) 2010   left breast cancer-lumpectomy   Complication of anesthesia    hard to wake after humerous   Diverticulitis of colon with perforation    Family history of breast cancer 06/30/2022   Fatty liver    GERD (gastroesophageal reflux disease)    Hx of breast cancer    Hyperlipidemia    Hypertension    Perforated gastric ulcer ([place])    Personal history of breast cancer 06/30/2022    Encounter Details:  CNP Questionnaire - 07/19/22 1800       Questionnaire   Ask client: Do you give verbal consent for me to treat you today? Yes    Student Assistance N/A    Location Patient [facility]    Visit Setting with Client [employer]    Patient Status Migrant    Insurance Uninsured ([place] Card/Care Connects/Self-Pay/Medicaid Family Planning)    Insurance/Financial Assistance Referral N/A    Medication Have Medication Insecurities    Medical Provider Yes    Screening Referrals Made N/A    Medical Referrals Made N/A    Medical Appointment Made N/A    Recently w/o PCP, now 1st time PCP visit completed due to CNs referral or appointment made N/A    Food Have Food Insecurities;Referred to Food Pantry    Transportation N/A    Housing/Utilities Referred to housing/utility assistance program    Interpersonal Safety N/A    Interventions Advocate/Support;Educate;Counsel;Reviewed New Diagnosis    Abnormal to Normal Screening Since Last CN Visit N/A    Screenings CN Performed Blood Pressure;Blood Glucose    Sent Client to Lab for: N/A    Did client attend any of the following based off CNs referral or appointments made? N/A    ED Visit Averted Yes    Life-Saving Intervention Made N/A            Client came into [facility] for dinner and to see this CN in nurse clinic.  Client reports recent right breast cancer with  lump removed.  Had ongoing radiation treatments until 06/20/22.  Reports that the oncologist wants her to take a pill for chemotherapy, but client is afraid.  Chemo pill after Left breast surgery about 3 years ago made her sick and she could not take it.  Client was encouraged to talk to MD about her feelings.  Client says she has a PCP appointment on 10/23.  Client to discuss several issues with PCP and I encouraged her to make a list of problems to discuss.  Finger stick glucose was 127 after eating dinner at the church.  Client would like assistance with paying rent per daughter.  Will refer for rental assistance.  Client to follow up with this CN as needed.  Spanish interpreter [place] assisted with translation during this encounter.  Karene Fry, RN, MSN, [place] Office [phone] Cell

## 2022-07-22 NOTE — Progress Notes (Signed)
Maria Dominguez presents today for follow-up after completing radiation to her right breast on 06/20/2022  Pain: Reports occasional sharp pains to the breast (similar to after her lumpectomy) Skin: Reports skin is intact and well healed ROM: Denies any issues or concerns Lymphedema: Denies MedOnc F/U: Saw Dr. Chryl Heck on 07/07/2022 and will see Annabelle Harman in the [facility] in December Other issues of note: Doing well. Does report some muscle/bone discomfort that her PCP believes may be related to her cholesterol medication. Scheduled for F/U with PCP 08/01/22. Looking forward to celebrating her birthday on Monday  Pt reports Yes No Comments  Tamoxifen '[]'$  '[x]'$    Letrozole '[]'$  '[x]'$    Anastrazole '[]'$  '[x]'$    Mammogram '[x]'$  Date: TBD '[]'$ 

## 2022-08-01 ENCOUNTER — Encounter: Payer: Self-pay | Admitting: Nurse Practitioner

## 2022-08-01 ENCOUNTER — Encounter: Payer: Self-pay | Admitting: *Deleted

## 2022-08-01 ENCOUNTER — Ambulatory Visit: Payer: Self-pay | Attending: Nurse Practitioner | Admitting: Nurse Practitioner

## 2022-08-01 ENCOUNTER — Other Ambulatory Visit: Payer: Self-pay

## 2022-08-01 VITALS — BP 148/81 | HR 80 | Temp 98.1°F | Ht <= 58 in | Wt 158.4 lb

## 2022-08-01 DIAGNOSIS — E559 Vitamin D deficiency, unspecified: Secondary | ICD-10-CM

## 2022-08-01 DIAGNOSIS — R748 Abnormal levels of other serum enzymes: Secondary | ICD-10-CM

## 2022-08-01 DIAGNOSIS — E785 Hyperlipidemia, unspecified: Secondary | ICD-10-CM

## 2022-08-01 DIAGNOSIS — M25562 Pain in left knee: Secondary | ICD-10-CM

## 2022-08-01 DIAGNOSIS — I10 Essential (primary) hypertension: Secondary | ICD-10-CM

## 2022-08-01 DIAGNOSIS — G8929 Other chronic pain: Secondary | ICD-10-CM

## 2022-08-01 DIAGNOSIS — F5101 Primary insomnia: Secondary | ICD-10-CM

## 2022-08-01 MED ORDER — TRAZODONE HCL 50 MG PO TABS
25.0000 mg | ORAL_TABLET | Freq: Every evening | ORAL | 3 refills | Status: AC | PRN
  Filled 2022-08-01: qty 30, 30d supply, fill #0

## 2022-08-01 MED ORDER — OMEGA-3-ACID ETHYL ESTERS 1 G PO CAPS
1.0000 g | ORAL_CAPSULE | Freq: Two times a day (BID) | ORAL | 1 refills | Status: DC
  Filled 2022-08-01 – 2022-09-06 (×2): qty 60, 30d supply, fill #0
  Filled 2022-10-06 – 2022-10-17 (×3): qty 60, 30d supply, fill #1

## 2022-08-01 MED ORDER — ROSUVASTATIN CALCIUM 20 MG PO TABS
20.0000 mg | ORAL_TABLET | Freq: Every day | ORAL | 3 refills | Status: DC
  Filled 2022-08-01: qty 90, 90d supply, fill #0
  Filled 2022-09-06: qty 30, 30d supply, fill #0
  Filled 2022-10-06 – 2022-10-17 (×3): qty 30, 30d supply, fill #1
  Filled 2023-04-26 – 2023-05-03 (×2): qty 30, 30d supply, fill #2

## 2022-08-01 MED ORDER — LOSARTAN POTASSIUM-HCTZ 100-25 MG PO TABS
1.0000 | ORAL_TABLET | Freq: Every day | ORAL | 1 refills | Status: DC
  Filled 2022-08-01: qty 90, 90d supply, fill #0
  Filled 2022-09-06: qty 30, 30d supply, fill #0
  Filled 2022-10-06 – 2022-10-17 (×3): qty 30, 30d supply, fill #1
  Filled 2022-11-21: qty 30, 30d supply, fill #2
  Filled 2022-12-28: qty 30, 30d supply, fill #3
  Filled 2023-02-01 (×2): qty 30, 30d supply, fill #4

## 2022-08-01 NOTE — Progress Notes (Signed)
Nanda Quinton patients daughter is in room.   Patient has problems falling asleep and staying asleep. Pt states she is only able to sleep for 3 or 4 hours a night.

## 2022-08-01 NOTE — Progress Notes (Signed)
Assessment & Plan:  Maria Dominguez was seen today for hypertension.  Diagnoses and all orders for this visit:  Essential hypertension -     losartan-hydrochlorothiazide (HYZAAR) 100-25 MG tablet; Take 1 tablet by mouth daily. Please fill as a 90 day supply Continue all antihypertensives as prescribed.  Reminded to bring in blood pressure log for follow  up appointment.  RECOMMENDATIONS: DASH/Mediterranean Diets are healthier choices for HTN.    Vitamin D deficiency disease -     VITAMIN D 25 Hydroxy (Vit-D Deficiency, Fractures)  Elevated alkaline phosphatase level -     CMP14+EGFR  Chronic pain of left knee -     DG Knee Complete 4 Views Left; Future  Hyperlipidemia, unspecified hyperlipidemia type -     rosuvastatin (CRESTOR) 20 MG tablet; Take 1 tablet (20 mg total) by mouth daily. Please fill as a 90 day supply -     omega-3 acid ethyl esters (LOVAZA) 1 g capsule; Take 1 capsule (1 g total) by mouth 2 (two) times daily. Please fill as a 90 day supply INSTRUCTIONS: Work on a low fat, heart healthy diet and participate in regular aerobic exercise program by working out at least 150 minutes per week; 5 days a week-30 minutes per day. Avoid red meat/beef/steak,  fried foods. junk foods, sodas, sugary drinks, unhealthy snacking, alcohol and smoking.  Drink at least 80 oz of water per day and monitor your carbohydrate intake daily.    Primary insomnia -     traZODone (DESYREL) 50 MG tablet; Take 0.5-1 tablets (25-50 mg total) by mouth at bedtime as needed for sleep.    Patient has been counseled on age-appropriate routine health concerns for screening and prevention. These are reviewed and up-to-date. Referrals have been placed accordingly. Immunizations are up-to-date or declined.    Subjective:   Chief Complaint  Patient presents with   Hypertension   HPI Maria Dominguez 71 y.o. female presents to office today for follow up to HTN. She is accompanied by her daughter Maria Dominguez who is also  a patient of mine.   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     VRI was used to communicate directly with patient for the entire encounter including providing detailed patient instructions.     She is not taking arimidex. She never started taking it after it was prescribed for her last month. States she read the side effects and decided not to take it. I instructed Maria Dominguez and her daughter Maria Dominguez to contact oncology as soon as possible to let them know she is not taking.     HTN Blood pressure is elevated today. She does have a wrist cuff and reports normal BP readings at home. I recommended she bring her BP log to each visit for review. She is currently taking hyzaar 100-25 mg daily as prescribed.  BP Readings from Last 3 Encounters:  08/01/22 (!) 148/81  07/22/22 (!) 152/62  07/19/22 (!) 150/85     She is having trouble falling asleep and staying asleep. Currently taking melatonin and I prescribed trazodone but she states a whole 50 mg tablets makes her drowsy during the day. I have instructed her to try 25 mg (half tab).    Endorses chronic left knee pain which is worse with walking. BMI 36.82. Knee pain has been  constant over the past several months. She denies any injury or trauma. 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  HENT: Negative.  Negative for nosebleeds.   Eyes: Negative.  Negative for blurred vision, double vision and photophobia.  Respiratory: Negative.  Negative for cough and shortness of breath.   Cardiovascular: Negative.  Negative for chest pain, palpitations and leg swelling.  Gastrointestinal: Negative.  Negative for heartburn, nausea and  vomiting.  Musculoskeletal:  Positive for joint pain. Negative for myalgias.  Neurological: Negative.  Negative for dizziness, focal weakness, seizures and headaches.  Psychiatric/Behavioral:  Negative for suicidal ideas. The patient has insomnia.     Past Medical History:  Diagnosis Date   Cancer [facility]) 2010   left breast cancer-lumpectomy   Complication of anesthesia    hard to wake after humerous   Diverticulitis of colon with perforation    Family history of breast cancer 06/30/2022   Fatty liver    GERD (gastroesophageal reflux disease)    Hx of breast cancer    Hyperlipidemia    Hypertension    Perforated gastric ulcer ([place])    Personal history of breast cancer 06/30/2022    Past Surgical History:  Procedure Laterality Date   BREAST BIOPSY Right 03/16/2022   BREAST EXCISIONAL BIOPSY Left    unsure if patient has had breast ca, took tamoxifen   BREAST LUMPECTOMY WITH RADIOACTIVE SEED LOCALIZATION Right 04/21/2022   Procedure: RIGHT BREAST LUMPECTOMY WITH RADIOACTIVE SEED LOCALIZATION;  Surgeon: Maria Mesa, MD;  Location: [facility];  Service: General;  Laterality: Right;  LMA   HUMERUS FRACTURE SURGERY     left lumpectomy     MASS EXCISION Left    left axilla    Family History  Problem Relation Age of Onset   Diabetes Mother    Breast cancer Sister        d. 34s; twin sister (fraternal)   Liver cancer Cousin        double first cousins; x2; d. 37s-50s; alcohol abuse   Colon cancer Neg Hx    Esophageal cancer Neg Hx    Rectal cancer Neg Hx    Stomach cancer Neg Hx     Social History Reviewed with no changes to be made today.   Outpatient Medications Prior to Visit  Medication Sig Dispense Refill   cholecalciferol (VITAMIN D3) 25 MCG (1000 UNIT) tablet Take 1 tablet (1,000 Units total) by mouth daily. 90 tablet 1   ibuprofen (ADVIL) 400 MG tablet Take 400 mg by mouth every 6 (six) hours as needed.     omeprazole (PRILOSEC) 20 MG capsule TAKE 1  TABLET (20 MG TOTAL) BY MOUTH DAILY. 90 capsule 1   losartan-hydrochlorothiazide (HYZAAR) 100-25 MG tablet Take 1 tablet by mouth daily. 90 tablet 1   omega-3 acid ethyl esters (LOVAZA) 1 g capsule Take 1 capsule (1 g total) by mouth 2 (two) times daily. 60 capsule 3   rosuvastatin (CRESTOR) 20 MG tablet TAKE 1 TABLET (20 MG TOTAL) BY MOUTH DAILY. 90 tablet 3   traZODone (DESYREL) 50 MG tablet Take 0.5-1 tablets (25-50 mg total) by mouth at bedtime as needed for sleep. 30 tablet 3   anastrozole (ARIMIDEX) 1 MG tablet Take 1 tablet (1 mg total) by mouth daily. (Patient not taking: Reported on 08/01/2022) 90 tablet 3   Facility-Administered Medications Prior to Visit  Medication Dose Route Frequency Provider  Last Rate Last Admin   bupivacaine liposome (EXPAREL) 1.3 % injection 266 mg  20 mL Infiltration Once Stark Klein, MD        No Known Allergies     Objective:    BP (!) 148/81   Pulse 80   Temp 98.1 F (36.7 C) (Temporal)   Ht '4\' 7"'  (1.397 m)   Wt 158 lb 6.4 oz (71.8 kg)   SpO2 98%   BMI 36.82 kg/m  Wt Readings from Last 3 Encounters:  08/01/22 158 lb 6.4 oz (71.8 kg)  07/22/22 154 lb 6.4 oz (70 kg)  07/07/22 153 lb 14.4 oz (69.8 kg)    Physical Exam Vitals and nursing note reviewed.  Constitutional:      Appearance: She is well-developed.  HENT:     Head: Normocephalic and atraumatic.  Cardiovascular:     Rate and Rhythm: Normal rate and regular rhythm.     Heart sounds: Normal heart sounds. No murmur heard.    No friction rub. No gallop.  Pulmonary:     Effort: Pulmonary effort is normal. No tachypnea or respiratory distress.     Breath sounds: Normal breath sounds. No decreased breath sounds, wheezing, rhonchi or rales.  Chest:     Chest wall: No tenderness.  Abdominal:     General: Bowel sounds are normal.     Palpations: Abdomen is soft.  Musculoskeletal:        General: Normal range of motion.     Cervical back: Normal range of motion.     Left knee:  Swelling present. Tenderness present.  Skin:    General: Skin is warm and dry.  Neurological:     Mental Status: She is alert and oriented to person, place, and time.     Coordination: Coordination normal.  Psychiatric:        Behavior: Behavior normal. Behavior is cooperative.        Thought Content: Thought content normal.        Judgment: Judgment normal.          Patient has been counseled extensively about nutrition and exercise as well as the importance of adherence with medications and regular follow-up. The patient was given clear instructions to go to ER or return to medical center if symptoms don't improve, worsen or new problems develop. The patient verbalized understanding.   Follow-up: Return in about 3 months (around 11/01/2022).   [name], FNP-BC [facility] and [facility] [place], [place]   08/01/2022, 12:50 PM

## 2022-08-02 ENCOUNTER — Ambulatory Visit: Payer: Self-pay | Attending: Nurse Practitioner

## 2022-08-03 LAB — CMP14+EGFR
ALT: 9 IU/L (ref 0–32)
AST: 18 IU/L (ref 0–40)
Albumin/Globulin Ratio: 1.3 (ref 1.2–2.2)
Albumin: 4.1 g/dL (ref 3.8–4.8)
Alkaline Phosphatase: 137 IU/L — ABNORMAL HIGH (ref 44–121)
BUN/Creatinine Ratio: 25 (ref 12–28)
BUN: 18 mg/dL (ref 8–27)
Bilirubin Total: 0.4 mg/dL (ref 0.0–1.2)
CO2: 21 mmol/L (ref 20–29)
Calcium: 8.9 mg/dL (ref 8.7–10.3)
Chloride: 102 mmol/L (ref 96–106)
Creatinine, Ser: 0.73 mg/dL (ref 0.57–1.00)
Globulin, Total: 3.1 g/dL (ref 1.5–4.5)
Glucose: 92 mg/dL (ref 70–99)
Potassium: 5 mmol/L (ref 3.5–5.2)
Sodium: 137 mmol/L (ref 134–144)
Total Protein: 7.2 g/dL (ref 6.0–8.5)
eGFR: 87 mL/min/{1.73_m2} (ref >59)

## 2022-08-03 LAB — VITAMIN D 25 HYDROXY (VIT D DEFICIENCY, FRACTURES): Vit D, 25-Hydroxy: 37.8 ng/mL (ref 30.0–100.0)

## 2022-08-08 ENCOUNTER — Other Ambulatory Visit: Payer: Self-pay

## 2022-09-06 ENCOUNTER — Other Ambulatory Visit: Payer: Self-pay

## 2022-09-07 ENCOUNTER — Other Ambulatory Visit: Payer: Self-pay

## 2022-09-13 ENCOUNTER — Encounter: Payer: Self-pay | Admitting: *Deleted

## 2022-10-06 ENCOUNTER — Inpatient Hospital Stay: Payer: Self-pay | Attending: Hematology and Oncology | Admitting: Adult Health

## 2022-10-06 ENCOUNTER — Other Ambulatory Visit: Payer: Self-pay

## 2022-10-06 ENCOUNTER — Other Ambulatory Visit: Payer: Self-pay | Admitting: Nurse Practitioner

## 2022-10-06 ENCOUNTER — Encounter: Payer: Self-pay | Admitting: Adult Health

## 2022-10-06 VITALS — BP 131/66 | HR 69 | Temp 97.7°F | Resp 18 | Ht <= 58 in | Wt 157.4 lb

## 2022-10-06 DIAGNOSIS — E2839 Other primary ovarian failure: Secondary | ICD-10-CM

## 2022-10-06 DIAGNOSIS — C50411 Malignant neoplasm of upper-outer quadrant of right female breast: Secondary | ICD-10-CM | POA: Insufficient documentation

## 2022-10-06 DIAGNOSIS — Z87891 Personal history of nicotine dependence: Secondary | ICD-10-CM | POA: Insufficient documentation

## 2022-10-06 DIAGNOSIS — Z79811 Long term (current) use of aromatase inhibitors: Secondary | ICD-10-CM | POA: Insufficient documentation

## 2022-10-06 DIAGNOSIS — Z170 Estrogen receptor positive status [ER+]: Secondary | ICD-10-CM | POA: Insufficient documentation

## 2022-10-06 DIAGNOSIS — I10 Essential (primary) hypertension: Secondary | ICD-10-CM | POA: Insufficient documentation

## 2022-10-06 DIAGNOSIS — Z800 Family history of malignant neoplasm of digestive organs: Secondary | ICD-10-CM | POA: Insufficient documentation

## 2022-10-06 DIAGNOSIS — Z803 Family history of malignant neoplasm of breast: Secondary | ICD-10-CM | POA: Insufficient documentation

## 2022-10-06 DIAGNOSIS — K219 Gastro-esophageal reflux disease without esophagitis: Secondary | ICD-10-CM

## 2022-10-06 MED ORDER — OMEPRAZOLE 20 MG PO CPDR
20.0000 mg | DELAYED_RELEASE_CAPSULE | Freq: Every day | ORAL | 1 refills | Status: DC
  Filled 2022-10-06: qty 90, 90d supply, fill #0
  Filled 2022-10-07 – 2022-10-17 (×2): qty 30, 30d supply, fill #0
  Filled 2022-10-17: qty 90, 90d supply, fill #0

## 2022-10-06 NOTE — Progress Notes (Signed)
SURVIVORSHIP VISIT:  BRIEF ONCOLOGIC HISTORY:  Oncology History  Malignant neoplasm of upper-outer quadrant of right female breast ([place])  01/27/2022 Mammogram   Bilateral screening mammogram with possible mass in the right breast. In the left breast, possible masses requiring further evaluation. Diagnostic mammogram persistent oval, spiculated hyperdense mass in the upperouter right breast at posterior depth. Circumscribed masses in the deep central left breast are smaller on today's additional views, similar to multiple prior studies. Targeted ultrasound is performed, showing an oval, circumscribed hypoechoic mass with associated vascularity at the 9:30 position 10 cm from the nipple. It measures 1.2 x 1.0 x 0.9 cm. This corresponds with the mammographic finding. Evaluation of the right axilla demonstrates no suspicious findings.   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  Resection margins negative for carcinoma.  Overall grade 1.  Prior prognostic showed ER 100% strong staining, PR 100% strong staining, Ki-67 of 2% and HER2 of 0   04/28/2022 Initial Diagnosis   Malignant neoplasm of upper-outer quadrant of right female breast ([place])   05/30/2022 - 06/20/2022 Radiation Therapy   Site Technique Total Dose (Gy) Dose per Fx (Gy) Completed Fx Beam Energies  Breast, Right: Breast_R 3D 40.05/40.05 2.67 15/15 10X, 6XFFF     07/09/2022 Genetic Testing   Negative hereditary cancer genetic testing: no pathogenic variants detected in [facility]Next-Cancer +RNAinsight Panel.  Report date is July 09, 2022.   Negative hereditary cancer genetic testing: no pathogenic variants detected in [facility]Next-Cancer +RNAinsight Panel.  Report date is July 09, 2022.   The CustomNext-Cancer+RNAinsight panel offered by [facility] includes  sequencing and rearrangement analysis for the following 47 genes:  APC, ATM, AXIN2, BARD1, BMPR1A, BRCA1, BRCA2, BRIP1, CDH1, CDK4, CDKN2A, CHEK2, DICER1, EPCAM, GREM1, HOXB13, MEN1, MLH1, MSH2, MSH3, MSH6, MUTYH, NBN, NF1, NF2, NTHL1, PALB2, PMS2, POLD1, POLE, PTEN, RAD51C, RAD51D, RECQL, RET, SDHA, SDHAF2, SDHB, SDHC, SDHD, SMAD4, SMARCA4, STK11, TP53, TSC1, TSC2, and VHL.  RNA data is routinely analyzed for use in variant interpretation for all genes.   08/2022 -  Anti-estrogen oral therapy   Anastrozole     INTERVAL HISTORY:  Maria Dominguez to review her survivorship care plan detailing her treatment course for breast cancer, as well as monitoring long-term side effects of that treatment, education regarding health maintenance, screening, and overall wellness and health promotion.     Overall, Maria Dominguez reports feeling quite well.  She is experiencing increased arthralgias.  She notes it most often in her lower back and left knee.  She says that it worsened once starting Anastrozole.  She is also fatigued and has some vaginal dryness.    REVIEW OF SYSTEMS:  Review of Systems  Constitutional:  Positive for fatigue. Negative for appetite change, chills, fever and unexpected weight change.  HENT:   Negative for hearing loss, lump/mass and trouble swallowing.   Eyes:  Negative for eye problems and icterus.  Respiratory:  Negative for chest tightness, cough and shortness of breath.   Cardiovascular:  Negative for chest pain, leg swelling and palpitations.  Gastrointestinal:  Negative for abdominal distention, abdominal pain, constipation, diarrhea, nausea and vomiting.  Endocrine: Negative for hot flashes.  Genitourinary:  Negative for difficulty urinating.   Musculoskeletal:  Positive for arthralgias.  Skin:  Negative for itching and rash.  Neurological:  Negative for dizziness, extremity weakness, headaches and numbness.  Hematological:  Negative for adenopathy. Does not bruise/bleed easily.   Psychiatric/Behavioral:  Negative for depression. The patient is not nervous/anxious.   Breast: Denies any new nodularity, masses, tenderness, nipple changes, or nipple discharge.      PAST MEDICAL/SURGICAL HISTORY:  Past Medical History:  Diagnosis Date   Cancer [facility]) 2010   left breast cancer-lumpectomy   Complication of anesthesia    hard to wake after humerous   Diverticulitis of colon with perforation    Family history of breast cancer 06/30/2022   Fatty liver    GERD (gastroesophageal reflux disease)    Hx of breast cancer    Hyperlipidemia    Hypertension    Perforated gastric ulcer ([place])    Personal history of breast cancer 06/30/2022   Past Surgical History:  Procedure Laterality Date   BREAST BIOPSY Right 03/16/2022   BREAST EXCISIONAL BIOPSY Left    unsure if patient has had breast ca, took tamoxifen   BREAST LUMPECTOMY WITH RADIOACTIVE SEED LOCALIZATION Right 04/21/2022   Procedure: RIGHT BREAST LUMPECTOMY WITH RADIOACTIVE SEED LOCALIZATION;  Surgeon: Donnie Mesa, MD;  Location: [facility];  Service: General;  Laterality: Right;  LMA   HUMERUS FRACTURE SURGERY     left lumpectomy     MASS EXCISION Left    left axilla     ALLERGIES:  No Known Allergies   CURRENT MEDICATIONS:  Outpatient Encounter Medications as of 10/06/2022  Medication Sig   anastrozole (ARIMIDEX) 1 MG tablet Take 1 tablet (1 mg total) by mouth daily.   cholecalciferol (VITAMIN D3) 25 MCG (1000 UNIT) tablet Take 1 tablet (1,000 Units total) by mouth daily.   ibuprofen (ADVIL) 400 MG tablet Take 400 mg by mouth every 6 (six) hours as needed.   losartan-hydrochlorothiazide (HYZAAR) 100-25 MG tablet Take 1 tablet by mouth daily.   omega-3 acid ethyl esters (LOVAZA) 1 g capsule Take 1 capsule (1 g total) by mouth 2 (two) times daily.   omeprazole (PRILOSEC) 20 MG capsule TAKE 1 TABLET (20 MG TOTAL) BY MOUTH DAILY.   rosuvastatin (CRESTOR) 20 MG tablet Take 1 tablet (20 mg  total) by mouth daily.   traZODone (DESYREL) 50 MG tablet Take 0.5-1 tablets (25-50 mg total) by mouth at bedtime as needed for sleep.   Facility-Administered Encounter Medications as of 10/06/2022  Medication   bupivacaine liposome (EXPAREL) 1.3 % injection 266 mg     ONCOLOGIC FAMILY HISTORY:  Family History  Problem Relation Age of Onset   Diabetes Mother    Breast cancer Sister        d. 40s; twin sister (fraternal)   Liver cancer Cousin        double first cousins; x2; d. 54s-50s; alcohol abuse   Colon cancer Neg Hx    Esophageal cancer Neg Hx    Rectal cancer Neg Hx    Stomach cancer Neg Hx       SOCIAL HISTORY:  Social History   Socioeconomic History   Marital status: Single    Spouse name: Not on file   Number of children: 2   Years of education: Not on file   Highest education level: 10th grade  Occupational History   Not on file  Tobacco Use   Smoking status: Former    Types: Cigarettes   Smokeless tobacco: Never  Vaping Use   Vaping Use: Never used  Substance and Sexual Activity   Alcohol use: Not Currently   Drug use: No   Sexual activity: Not Currently  Birth control/protection: Post-menopausal  Other Topics Concern   Not on file  Social History Narrative   Not on file   Social Determinants of Health   Financial Resource Strain: Not on file  Food Insecurity: Food Insecurity Present (03/15/2022)   Hunger Vital Sign    Worried About Running Out of Food in the Last Year: Sometimes true    Ran Out of Food in the Last Year: Sometimes true  Transportation Needs: No Transportation Needs (03/15/2022)   PRAPARE - Hydrologist (Medical): No    Lack of Transportation (Non-Medical): No  Physical Activity: Not on file  Stress: Not on file  Social Connections: Not on file  Intimate Partner Violence: Not on file     OBSERVATIONS/OBJECTIVE:  BP 131/66 (BP Location: Right Arm, Patient Position: Sitting)   Pulse 69   Temp  97.7 F (36.5 C) (Tympanic)   Resp 18   Ht _0  (1.397 m)   Wt 157 lb 6.4 oz (71.4 kg)   SpO2 100%   BMI 36.58 kg/m  GENERAL: Patient is a well appearing female in no acute distress HEENT:  Sclerae anicteric.  Oropharynx clear and moist. No ulcerations or evidence of oropharyngeal candidiasis. Neck is supple.  NODES:  No cervical, supraclavicular, or axillary lymphadenopathy palpated.  BREAST EXAM: Right breast status postlumpectomy and radiation no sign of local recurrence left breast is benign. LUNGS:  Clear to auscultation bilaterally.  No wheezes or rhonchi. HEART:  Regular rate and rhythm. No murmur appreciated. ABDOMEN:  Soft, nontender.  Positive, normoactive bowel sounds. No organomegaly palpated. MSK:  No focal spinal tenderness to palpation. Full range of motion bilaterally in the upper extremities. EXTREMITIES:  No peripheral edema.   SKIN:  Clear with no obvious rashes or skin changes. No nail dyscrasia. NEURO:  Nonfocal. Well oriented.  Appropriate affect.   LABORATORY DATA:  None for this visit.  DIAGNOSTIC IMAGING:  None for this visit.      ASSESSMENT AND PLAN:  Ms.. Dominguez is a pleasant 71 y.o. female with Stage IA right breast invasive ductal carcinoma, ER+/PR+/HER2-, diagnosed in 01/2022, treated with lumpectomy, adjuvant radiation therapy, and anti-estrogen therapy with Anastrozole beginning in 08/2022.  She presents to the Survivorship Clinic for our initial meeting and routine follow-up post-completion of treatment for breast cancer.    1. Stage IA right breast cancer:  Maria Dominguez is continuing to recover from definitive treatment for breast cancer. She will follow-up with her medical oncologist, Dr. Chryl Heck in 11/2022 with history and physical exam per surveillance protocol.  She will continue her anti-estrogen therapy with Anastrozole. Thus far, she is tolerating the Anastrozole moderately well, see #2.  Her mammogram is due 01/2023; orders placed today.   Today,  a comprehensive survivorship care plan and treatment summary was reviewed with the patient today detailing her breast cancer diagnosis, treatment course, potential late/long-term effects of treatment, appropriate follow-up care with recommendations for the future, and patient education resources.  A copy of this summary, along with a letter will be sent to the patient's primary care provider via mail/fax/In Basket message after today's visit.    2.  Arthralgias: I recommended that she follow-up with her primary care provider about her left knee pain.  She could have arthritis or fluid on her joint that could be removed and she could benefit from additional management of this pain.  I recommended that she give the anastrozole little bit more time as the achiness will likely  improve over the next 6 weeks.  3. Bone health:  Given Maria Dominguez's age/history of breast cancer and her current treatment regimen including anti-estrogen therapy with Anastrozole, she is at risk for bone demineralization.  She has not undergone bone density testing and I placed orders for that to be completed within the next few weeks at [facility].  She was given education on specific activities to promote bone health.  4. Cancer screening:  Due to Maria Dominguez's history and her age, she should receive screening for skin cancers, colon cancer.  The information and recommendations are listed on the patient's comprehensive care plan/treatment summary and were reviewed in detail with the patient.    5. Health maintenance and wellness promotion: Maria Dominguez was encouraged to consume 5-7 servings of fruits and vegetables per day. We reviewed the "Nutrition Rainbow" handout.  She was also encouraged to engage in moderate to vigorous exercise for 30 minutes per day most days of the week. We discussed the [employer] fitness program, which is designed for cancer survivors to help them become more physically fit after cancer  treatments.  She was instructed to limit her alcohol consumption and continue to abstain from tobacco use.     6. Support services/counseling: It is not uncommon for this period of the patient's cancer care trajectory to be one of many emotions and stressors.    She was given information regarding our available services and encouraged to contact me with any questions or for help enrolling in any of our support group/programs.    Follow up instructions:    -Return to cancer center in 11/2022 for f/u with Dr. Chryl Heck  -Mammogram due in 01/2023 -DEXA 10/2022 -She is welcome to return back to the Survivorship Clinic at any time; no additional follow-up needed at this time.  -Consider referral back to survivorship as a long-term survivor for continued surveillance  The patient was provided an opportunity to ask questions and all were answered. The patient agreed with the plan and demonstrated an understanding of the instructions.   Total encounter time:40 minutes*in face-to-face visit time, chart review, lab review, care coordination, order entry, and documentation of the encounter time.    Wilber Bihari, NP 10/06/22 10:15 AM Medical Oncology and Hematology [facility] [place], [place] [address] Tel. [phone]    Fax. [phone]  *Total Encounter Time as defined by the Centers for Medicare and Medicaid Services includes, in addition to the face-to-face time of a patient visit (documented in the note above) non-face-to-face time: obtaining and reviewing outside history, ordering and reviewing medications, tests or procedures, care coordination (communications with other health care professionals or caregivers) and documentation in the medical record.

## 2022-10-07 ENCOUNTER — Other Ambulatory Visit: Payer: Self-pay

## 2022-10-11 ENCOUNTER — Ambulatory Visit (HOSPITAL_BASED_OUTPATIENT_CLINIC_OR_DEPARTMENT_OTHER)
Admission: RE | Admit: 2022-10-11 | Discharge: 2022-10-11 | Disposition: A | Discharge status: Home / Self Care | Payer: Self-pay | Source: Ambulatory Visit | Attending: Adult Health | Admitting: Adult Health

## 2022-10-11 DIAGNOSIS — E2839 Other primary ovarian failure: Secondary | ICD-10-CM | POA: Insufficient documentation

## 2022-10-13 ENCOUNTER — Other Ambulatory Visit: Payer: Self-pay

## 2022-10-17 ENCOUNTER — Other Ambulatory Visit: Payer: Self-pay

## 2022-11-01 ENCOUNTER — Encounter: Payer: Self-pay | Admitting: Nurse Practitioner

## 2022-11-01 ENCOUNTER — Ambulatory Visit: Payer: Self-pay | Attending: Nurse Practitioner | Admitting: Nurse Practitioner

## 2022-11-01 DIAGNOSIS — I10 Essential (primary) hypertension: Secondary | ICD-10-CM

## 2022-11-01 NOTE — Progress Notes (Signed)
Assessment & Plan:  Maria Dominguez was seen today for hypertension.  Diagnoses and all orders for this visit:  Essential hypertension -     CMP14+EGFR Continue antihypertensive as prescribed.  Reminded to bring in blood pressure log for follow  up appointment.  RECOMMENDATIONS: DASH/Mediterranean Diets are healthier choices for HTN.     Patient has been counseled on age-appropriate routine health concerns for screening and prevention. These are reviewed and up-to-date. Referrals have been placed accordingly. Immunizations are up-to-date or declined.    Subjective:   Chief Complaint  Patient presents with   Hypertension   Hypertension Pertinent negatives include no blurred vision, chest pain, headaches, malaise/fatigue, palpitations or shortness of breath.   Maria Dominguez 72 y.o. female presents to office today for follow up to HTN  VRI was used to communicate directly with patient for the entire encounter including providing detailed patient instructions.   She is also accompanied by her daughter today who is also providing some of her pertinent    She  has a past medical history of Diverticulitis of colon with perforation, Fatty liver, right breast cancer, Hyperlipidemia, Hypertension, and Perforated gastric ulcer.   HTN Blood pressure is well controlled.  She is taking hyzaar 100-25 mg daily as prescribed.  BP Readings from Last 3 Encounters:  11/01/22 132/78  10/06/22 131/66  08/01/22 (!) 148/81       Review of Systems  Constitutional:  Negative for fever, malaise/fatigue and weight loss.  HENT: Negative.  Negative for nosebleeds.   Eyes: Negative.  Negative for blurred vision, double vision and photophobia.  Respiratory: Negative.  Negative for cough and shortness of breath.   Cardiovascular: Negative.  Negative for chest pain, palpitations and leg swelling.  Gastrointestinal: Negative.  Negative for heartburn, nausea and vomiting.  Musculoskeletal: Negative.  Negative  for myalgias.  Neurological: Negative.  Negative for dizziness, focal weakness, seizures and headaches.  Psychiatric/Behavioral: Negative.  Negative for suicidal ideas.     Past Medical History:  Diagnosis Date   Cancer [facility]) 2010   left breast cancer-lumpectomy   Complication of anesthesia    hard to wake after humerous   Diverticulitis of colon with perforation    Family history of breast cancer 06/30/2022   Fatty liver    GERD (gastroesophageal reflux disease)    Hx of breast cancer    Hyperlipidemia    Hypertension    Perforated gastric ulcer ([place])    Personal history of breast cancer 06/30/2022    Past Surgical History:  Procedure Laterality Date   BREAST BIOPSY Right 03/16/2022   BREAST EXCISIONAL BIOPSY Left    unsure if patient has had breast ca, took tamoxifen   BREAST LUMPECTOMY WITH RADIOACTIVE SEED LOCALIZATION Right 04/21/2022   Procedure: RIGHT BREAST LUMPECTOMY WITH RADIOACTIVE SEED LOCALIZATION;  Surgeon: Donnie Mesa, MD;  Location: [facility];  Service: General;  Laterality: Right;  LMA   HUMERUS FRACTURE SURGERY     left lumpectomy     MASS EXCISION Left    left axilla    Family History  Problem Relation Age of Onset   Diabetes Mother    Breast cancer Sister        d. 69s; twin sister (fraternal)   Liver cancer Cousin        double first cousins; x2; d. 26s-50s; alcohol abuse   Colon cancer Neg Hx    Esophageal cancer Neg Hx    Rectal cancer Neg Hx    Stomach cancer Neg  Hx     Social History Reviewed with no changes to be made today.   Outpatient Medications Prior to Visit  Medication Sig Dispense Refill   anastrozole (ARIMIDEX) 1 MG tablet Take 1 tablet (1 mg total) by mouth daily. 90 tablet 3   cholecalciferol (VITAMIN D3) 25 MCG (1000 UNIT) tablet Take 1 tablet (1,000 Units total) by mouth daily. 90 tablet 1   ibuprofen (ADVIL) 400 MG tablet Take 400 mg by mouth every 6 (six) hours as needed.     losartan-hydrochlorothiazide  (HYZAAR) 100-25 MG tablet Take 1 tablet by mouth daily. 90 tablet 1   omega-3 acid ethyl esters (LOVAZA) 1 g capsule Take 1 capsule (1 g total) by mouth 2 (two) times daily. 180 capsule 1   omeprazole (PRILOSEC) 20 MG capsule Take 1 capsule (20 mg total) by mouth daily. 90 capsule 1   rosuvastatin (CRESTOR) 20 MG tablet Take 1 tablet (20 mg total) by mouth daily. 90 tablet 3   traZODone (DESYREL) 50 MG tablet Take 0.5-1 tablets (25-50 mg total) by mouth at bedtime as needed for sleep. 30 tablet 3   Facility-Administered Medications Prior to Visit  Medication Dose Route Frequency Provider Last Rate Last Admin   bupivacaine liposome (EXPAREL) 1.3 % injection 266 mg  20 mL Infiltration Once Stark Klein, MD        No Known Allergies     Objective:    BP 132/78   Pulse 76   Ht '4\' 7"'$  (1.397 m)   Wt 156 lb 12.8 oz (71.1 kg)   SpO2 99%   BMI 36.44 kg/m  Wt Readings from Last 3 Encounters:  11/01/22 156 lb 12.8 oz (71.1 kg)  10/06/22 157 lb 6.4 oz (71.4 kg)  08/01/22 158 lb 6.4 oz (71.8 kg)    Physical Exam Vitals and nursing note reviewed.  Constitutional:      Appearance: She is well-developed.  HENT:     Head: Normocephalic and atraumatic.  Cardiovascular:     Rate and Rhythm: Normal rate and regular rhythm.     Heart sounds: Normal heart sounds. No murmur heard.    No friction rub. No gallop.  Pulmonary:     Effort: Pulmonary effort is normal. No tachypnea or respiratory distress.     Breath sounds: Normal breath sounds. No decreased breath sounds, wheezing, rhonchi or rales.  Chest:     Chest wall: No tenderness.  Abdominal:     General: Bowel sounds are normal.     Palpations: Abdomen is soft.  Musculoskeletal:        General: Normal range of motion.     Cervical back: Normal range of motion.  Skin:    General: Skin is warm and dry.  Neurological:     Mental Status: She is alert and oriented to person, place, and time.     Coordination: Coordination normal.   Psychiatric:        Behavior: Behavior normal. Behavior is cooperative.        Thought Content: Thought content normal.        Judgment: Judgment normal.          Patient has been counseled extensively about nutrition and exercise as well as the importance of adherence with medications and regular follow-up. The patient was given clear instructions to go to ER or return to medical center if symptoms don't improve, worsen or new problems develop. The patient verbalized understanding.   Follow-up: Return in about 3 months (around 01/31/2023).  [name], FNP-BC [facility], [place]   11/01/2022, 3:57 PM

## 2022-11-02 LAB — CMP14+EGFR
ALT: 12 IU/L (ref 0–32)
AST: 18 IU/L (ref 0–40)
Albumin/Globulin Ratio: 1.2 (ref 1.2–2.2)
Albumin: 4.2 g/dL (ref 3.8–4.8)
Alkaline Phosphatase: 150 IU/L — ABNORMAL HIGH (ref 44–121)
BUN/Creatinine Ratio: 27 (ref 12–28)
BUN: 22 mg/dL (ref 8–27)
Bilirubin Total: 0.2 mg/dL (ref 0.0–1.2)
CO2: 23 mmol/L (ref 20–29)
Calcium: 9.2 mg/dL (ref 8.7–10.3)
Chloride: 101 mmol/L (ref 96–106)
Creatinine, Ser: 0.83 mg/dL (ref 0.57–1.00)
Globulin, Total: 3.6 g/dL (ref 1.5–4.5)
Glucose: 100 mg/dL — ABNORMAL HIGH (ref 70–99)
Potassium: 4.1 mmol/L (ref 3.5–5.2)
Sodium: 138 mmol/L (ref 134–144)
Total Protein: 7.8 g/dL (ref 6.0–8.5)
eGFR: 75 mL/min/{1.73_m2} (ref >59)

## 2022-11-06 ENCOUNTER — Other Ambulatory Visit: Payer: Self-pay | Admitting: Nurse Practitioner

## 2022-11-06 DIAGNOSIS — E782 Mixed hyperlipidemia: Secondary | ICD-10-CM

## 2022-11-21 ENCOUNTER — Other Ambulatory Visit: Payer: Self-pay

## 2022-11-23 ENCOUNTER — Other Ambulatory Visit: Payer: Self-pay

## 2022-12-05 ENCOUNTER — Other Ambulatory Visit: Payer: Self-pay

## 2022-12-05 ENCOUNTER — Inpatient Hospital Stay: Payer: Self-pay | Attending: Hematology and Oncology | Admitting: Hematology and Oncology

## 2022-12-05 ENCOUNTER — Encounter: Payer: Self-pay | Admitting: Hematology and Oncology

## 2022-12-05 VITALS — BP 142/67 | HR 80 | Temp 98.1°F | Resp 16 | Ht <= 58 in | Wt 153.6 lb

## 2022-12-05 DIAGNOSIS — Z803 Family history of malignant neoplasm of breast: Secondary | ICD-10-CM | POA: Insufficient documentation

## 2022-12-05 DIAGNOSIS — Z800 Family history of malignant neoplasm of digestive organs: Secondary | ICD-10-CM | POA: Insufficient documentation

## 2022-12-05 DIAGNOSIS — I10 Essential (primary) hypertension: Secondary | ICD-10-CM | POA: Insufficient documentation

## 2022-12-05 DIAGNOSIS — C50411 Malignant neoplasm of upper-outer quadrant of right female breast: Secondary | ICD-10-CM | POA: Insufficient documentation

## 2022-12-05 DIAGNOSIS — Z170 Estrogen receptor positive status [ER+]: Secondary | ICD-10-CM | POA: Insufficient documentation

## 2022-12-05 DIAGNOSIS — Z79811 Long term (current) use of aromatase inhibitors: Secondary | ICD-10-CM | POA: Insufficient documentation

## 2022-12-05 DIAGNOSIS — Z87891 Personal history of nicotine dependence: Secondary | ICD-10-CM | POA: Insufficient documentation

## 2022-12-05 NOTE — Progress Notes (Signed)
[facility] NOTE  Patient Care Team: [name], NP as PCP - General (Nurse Practitioner) Benay Pike, MD as Consulting Physician (Hematology and Oncology) Donnie Mesa, MD as Consulting Physician (General Surgery) Eppie Gibson, MD as Attending Physician (Radiation Oncology) Gardenia Phlegm, NP as Nurse Practitioner (Hematology and Oncology)  CHIEF COMPLAINTS/PURPOSE OF CONSULTATION:  Breast cancer follow up.  HISTORY OF PRESENTING ILLNESS:   Maria Dominguez 72 y.o. female is here because of recent diagnosis of right breast IDC.  I reviewed her records extensively and collaborated the history with the patient.  SUMMARY OF ONCOLOGIC HISTORY: Oncology History  Malignant neoplasm of upper-outer quadrant of right female breast ([place])  01/27/2022 Mammogram   Bilateral screening mammogram with possible mass in the right breast. In the left breast, possible masses requiring further evaluation. Diagnostic mammogram persistent oval, spiculated hyperdense mass in the upperouter right breast at posterior depth. Circumscribed masses in the deep central left breast are smaller on today's additional views, similar to multiple prior studies. Targeted ultrasound is performed, showing an oval, circumscribed hypoechoic mass with associated vascularity at the 9:30 position 10 cm from the nipple. It measures 1.2 x 1.0 x 0.9 cm. This corresponds with the mammographic finding. Evaluation of the right axilla demonstrates no suspicious findings.   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  Resection margins negative for carcinoma.  Overall grade 1.  Prior prognostic showed ER 100% strong staining, PR 100% strong staining, Ki-67 of 2% and HER2 of 0   04/21/2022 Cancer Staging   Staging form: Breast, AJCC 8th  Edition - Pathologic stage from 04/21/2022: Stage IA (pT1a, pN0, cM0, G1, ER+, PR+, HER2-) - Signed by Gardenia Phlegm, NP on 10/06/2022 Stage prefix: Initial diagnosis Histologic grading system: 3 grade system   05/30/2022 - 06/20/2022 Radiation Therapy   Site Technique Total Dose (Gy) Dose per Fx (Gy) Completed Fx Beam Energies  Breast, Right: Breast_R 3D 40.05/40.05 2.67 15/15 10X, 6XFFF     07/09/2022 Genetic Testing   Negative hereditary cancer genetic testing: no pathogenic variants detected in [facility] CustomNext-Cancer +RNAinsight Panel.  Report date is July 09, 2022.   Negative hereditary cancer genetic testing: no pathogenic variants detected in [facility]Next-Cancer +RNAinsight Panel.  Report date is July 09, 2022.   The CustomNext-Cancer+RNAinsight panel offered by [facility] includes sequencing and rearrangement analysis for the following 47 genes:  APC, ATM, AXIN2, BARD1, BMPR1A, BRCA1, BRCA2, BRIP1, CDH1, CDK4, CDKN2A, CHEK2, DICER1, EPCAM, GREM1, HOXB13, MEN1, MLH1, MSH2, MSH3, MSH6, MUTYH, NBN, NF1, NF2, NTHL1, PALB2, PMS2, POLD1, POLE, PTEN, RAD51C, RAD51D, RECQL, RET, SDHA, SDHAF2, SDHB, SDHC, SDHD, SMAD4, SMARCA4, STK11, TP53, TSC1, TSC2, and VHL.  RNA data is routinely analyzed for use in variant interpretation for all genes.   08/2022 -  Anti-estrogen oral therapy   Anastrozole    She mentions history of breast cancer on the left side about 13 years ago when she had surgery on the left, did not need any adjuvant chemoradiation but took tamoxifen for about 3 years.  She had some endometrial hyperplasia and bleeding complications related to this and hence tamoxifen was discontinued after 3 years.  She also reports breast cancer in her twin sister who died in her 43s, several family members with cancer including pancreatic cancer.  She completed adjuvant radiation and is now on anastrozole. She denies any  major complaints today.  She has been doing really  well except for some joint pains intermittently and some pain in the breast area at the site of surgery from time to time.  She has noticed this pain in the past 1 month. Rest of the pertinent 10 point ROS reviewed and negative.  MEDICAL HISTORY:  Past Medical History:  Diagnosis Date   Cancer [facility]) 2010   left breast cancer-lumpectomy   Complication of anesthesia    hard to wake after humerous   Diverticulitis of colon with perforation    Family history of breast cancer 06/30/2022   Fatty liver    GERD (gastroesophageal reflux disease)    Hx of breast cancer    Hyperlipidemia    Hypertension    Perforated gastric ulcer ([place])    Personal history of breast cancer 06/30/2022    SURGICAL HISTORY: Past Surgical History:  Procedure Laterality Date   BREAST BIOPSY Right 03/16/2022   BREAST EXCISIONAL BIOPSY Left    unsure if patient has had breast ca, took tamoxifen   BREAST LUMPECTOMY WITH RADIOACTIVE SEED LOCALIZATION Right 04/21/2022   Procedure: RIGHT BREAST LUMPECTOMY WITH RADIOACTIVE SEED LOCALIZATION;  Surgeon: Donnie Mesa, MD;  Location: [facility];  Service: General;  Laterality: Right;  LMA   HUMERUS FRACTURE SURGERY     left lumpectomy     MASS EXCISION Left    left axilla    SOCIAL HISTORY: Social History   Socioeconomic History   Marital status: Single    Spouse name: Not on file   Number of children: 2   Years of education: Not on file   Highest education level: 10th grade  Occupational History   Not on file  Tobacco Use   Smoking status: Former    Types: Cigarettes   Smokeless tobacco: Never  Vaping Use   Vaping Use: Never used  Substance and Sexual Activity   Alcohol use: Not Currently   Drug use: No   Sexual activity: Not Currently    Birth control/protection: Post-menopausal  Other Topics Concern   Not on file  Social History Narrative   Not on file   Social Determinants of Health   Financial Resource Strain: Not on file   Food Insecurity: Food Insecurity Present (03/15/2022)   Hunger Vital Sign    Worried About Running Out of Food in the Last Year: Sometimes true    Ran Out of Food in the Last Year: Sometimes true  Transportation Needs: No Transportation Needs (03/15/2022)   PRAPARE - Hydrologist (Medical): No    Lack of Transportation (Non-Medical): No  Physical Activity: Not on file  Stress: Not on file  Social Connections: Not on file  Intimate Partner Violence: Not on file    FAMILY HISTORY: Family History  Problem Relation Age of Onset   Diabetes Mother    Breast cancer Sister        d. 19s; twin sister (fraternal)   Liver cancer Cousin        double first cousins; x2; d. 15s-50s; alcohol abuse   Colon cancer Neg Hx    Esophageal cancer Neg Hx    Rectal cancer Neg Hx    Stomach cancer Neg Hx     ALLERGIES:  has No Known Allergies.  MEDICATIONS:  Current Outpatient Medications  Medication Sig Dispense Refill   anastrozole (ARIMIDEX) 1 MG tablet Take 1 tablet (1 mg total) by mouth daily. 90 tablet 3  cholecalciferol (VITAMIN D3) 25 MCG (1000 UNIT) tablet Take 1 tablet (1,000 Units total) by mouth daily. 90 tablet 1   ibuprofen (ADVIL) 400 MG tablet Take 400 mg by mouth every 6 (six) hours as needed.     losartan-hydrochlorothiazide (HYZAAR) 100-25 MG tablet Take 1 tablet by mouth daily. 90 tablet 1   omega-3 acid ethyl esters (LOVAZA) 1 g capsule Take 1 capsule (1 g total) by mouth 2 (two) times daily. 180 capsule 1   omeprazole (PRILOSEC) 20 MG capsule Take 1 capsule (20 mg total) by mouth daily. 90 capsule 1   rosuvastatin (CRESTOR) 20 MG tablet Take 1 tablet (20 mg total) by mouth daily. 90 tablet 3   traZODone (DESYREL) 50 MG tablet Take 0.5-1 tablets (25-50 mg total) by mouth at bedtime as needed for sleep. 30 tablet 3   No current facility-administered medications for this visit.   Facility-Administered Medications Ordered in Other Visits  Medication  Dose Route Frequency Provider Last Rate Last Admin   bupivacaine liposome (EXPAREL) 1.3 % injection 266 mg  20 mL Infiltration Once Stark Klein, MD        REVIEW OF SYSTEMS:   Constitutional: Denies fevers, chills or abnormal night sweats Eyes: Denies blurriness of vision, double vision or watery eyes Ears, nose, mouth, throat, and face: Denies mucositis or sore throat Respiratory: Denies cough, dyspnea or wheezes Cardiovascular: Denies palpitation, chest discomfort or lower extremity swelling Gastrointestinal:  Denies nausea, heartburn or change in bowel habits Skin: Denies abnormal skin rashes Lymphatics: Denies new lymphadenopathy or easy bruising Neurological:Denies numbness, tingling or new weaknesses Behavioral/Psych: Mood is stable, no new changes  Breast: Denies any palpable lumps or discharge All other systems were reviewed with the patient and are negative.  PHYSICAL EXAMINATION: ECOG PERFORMANCE STATUS: 0 - Asymptomatic  Vitals:   12/05/22 0951  BP: (!) 142/67  Pulse: 80  Resp: 16  Temp: 98.1 F (36.7 C)  SpO2: 99%   Filed Weights   12/05/22 0951  Weight: 153 lb 9.6 oz (69.7 kg)    GENERAL:alert, no distress and comfortable Right breast status post surgery and some post surgical and radiation changes.  No palpable breast masses.  No regional adenopathy. No lower extremity edema  LABORATORY DATA:  I have reviewed the data as listed Lab Results  Component Value Date   WBC 7.2 04/29/2022   HGB 12.6 04/29/2022   HCT 38.7 04/29/2022   MCV 81 04/29/2022   PLT 370 04/29/2022   Lab Results  Component Value Date   NA 138 11/01/2022   K 4.1 11/01/2022   CL 101 11/01/2022   CO2 23 11/01/2022    RADIOGRAPHIC STUDIES: I have personally reviewed the radiological reports and agreed with the findings in the report.  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  She is now status post adjuvant radiation.  She had taken tamoxifen in the past and had endometrial hyperplasia and bleeding complications.  We have today discussed about trying aromatase inhibitors.  She is now on anastrozole and has been tolerating it really well.  She denies any complaints today.  The breast pain that she is reporting is likely postop  and postradiation changes.  No palpable masses.  She has her mammogram scheduled for May. I encouraged her to continue anastrozole as recommended and return to clinic in 6 months.  The emotional lability could indeed be from anastrozole, we have discussed about continuing surveillance since she is able to tolerate it and if she does not feel well, we can certainly consider some antidepressants.  Total time spent: 30 minutes including history, review of records, counseling and coordination of care All questions were answered. The patient knows to call the clinic with any problems, questions or concerns.    Benay Pike, MD 12/05/22

## 2022-12-05 NOTE — Assessment & Plan Note (Signed)
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  She is now status post adjuvant radiation.  She had taken tamoxifen in the past and had endometrial hyperplasia and bleeding complications.  We have today discussed about trying aromatase inhibitors.  She is now on anastrozole and has been tolerating it really well.  She denies any complaints today.  The breast pain that she is reporting is likely postop and postradiation changes.  No palpable masses.  She has her mammogram scheduled for May. I encouraged her to continue anastrozole as recommended and return to clinic in 6 months.  The emotional lability could indeed be from anastrozole, we have discussed about continuing surveillance since she is able to tolerate it and if she does not feel well, we can certainly consider some antidepressants.

## 2022-12-28 ENCOUNTER — Other Ambulatory Visit: Payer: Self-pay

## 2022-12-30 ENCOUNTER — Other Ambulatory Visit: Payer: Self-pay

## 2023-01-05 ENCOUNTER — Ambulatory Visit: Payer: Self-pay | Admitting: Hematology and Oncology

## 2023-02-01 ENCOUNTER — Other Ambulatory Visit: Payer: Self-pay

## 2023-02-07 ENCOUNTER — Other Ambulatory Visit: Payer: Self-pay

## 2023-02-07 ENCOUNTER — Encounter: Payer: Self-pay | Admitting: Nurse Practitioner

## 2023-02-07 ENCOUNTER — Ambulatory Visit: Payer: Self-pay | Attending: Nurse Practitioner | Admitting: Nurse Practitioner

## 2023-02-07 ENCOUNTER — Other Ambulatory Visit: Payer: Self-pay | Admitting: Nurse Practitioner

## 2023-02-07 VITALS — BP 156/80 | HR 65 | Ht <= 58 in | Wt 153.8 lb

## 2023-02-07 DIAGNOSIS — E559 Vitamin D deficiency, unspecified: Secondary | ICD-10-CM

## 2023-02-07 DIAGNOSIS — K219 Gastro-esophageal reflux disease without esophagitis: Secondary | ICD-10-CM

## 2023-02-07 DIAGNOSIS — R42 Dizziness and giddiness: Secondary | ICD-10-CM

## 2023-02-07 DIAGNOSIS — M810 Age-related osteoporosis without current pathological fracture: Secondary | ICD-10-CM

## 2023-02-07 DIAGNOSIS — E785 Hyperlipidemia, unspecified: Secondary | ICD-10-CM

## 2023-02-07 DIAGNOSIS — I10 Essential (primary) hypertension: Secondary | ICD-10-CM

## 2023-02-07 MED ORDER — VITAMIN D 25 MCG (1000 UNIT) PO TABS
1000.0000 [IU] | ORAL_TABLET | Freq: Every day | ORAL | 1 refills | Status: DC
  Filled 2023-02-07: qty 90, 90d supply, fill #0

## 2023-02-07 MED ORDER — OMEGA-3-ACID ETHYL ESTERS 1 G PO CAPS
1.0000 g | ORAL_CAPSULE | Freq: Two times a day (BID) | ORAL | 1 refills | Status: DC
  Filled 2023-02-07: qty 60, 30d supply, fill #0
  Filled 2023-03-14 – 2023-05-03 (×3): qty 60, 30d supply, fill #1

## 2023-02-07 MED ORDER — OMEPRAZOLE 20 MG PO CPDR
20.0000 mg | DELAYED_RELEASE_CAPSULE | Freq: Every day | ORAL | 1 refills | Status: DC
  Filled 2023-02-07: qty 90, 90d supply, fill #0
  Filled 2023-12-14: qty 90, 90d supply, fill #1

## 2023-02-07 MED ORDER — LOSARTAN POTASSIUM-HCTZ 100-25 MG PO TABS
1.0000 | ORAL_TABLET | Freq: Every day | ORAL | 1 refills | Status: DC
  Filled 2023-02-07 – 2023-03-14 (×2): qty 90, 90d supply, fill #0
  Filled 2023-12-14 (×2): qty 90, 90d supply, fill #1

## 2023-02-07 NOTE — Progress Notes (Signed)
Assessment & Plan:  Maria Dominguez was seen today for hypertension.  Diagnoses and all orders for this visit:  Essential hypertension -     losartan-hydrochlorothiazide (HYZAAR) 100-25 MG tablet; Take 1 tablet by mouth daily. -     CMP14+EGFR  Vitamin D deficiency -     cholecalciferol (VITAMIN D3) 25 MCG (1000 UNIT) tablet; Take 1 tablet (1,000 Units total) by mouth daily. -     VITAMIN D 25 Hydroxy (Vit-D Deficiency, Fractures)  Hyperlipidemia, unspecified hyperlipidemia type -     omega-3 acid ethyl esters (LOVAZA) 1 g capsule; Take 1 capsule (1 g total) by mouth 2 (two) times daily.  Gastroesophageal reflux disease without esophagitis -     omeprazole (PRILOSEC) 20 MG capsule; Take 1 capsule (20 mg total) by mouth daily.  Dizziness -     US Carotid Duplex Bilateral; Future  Age-related osteoporosis without current pathological fracture -     VITAMIN D 25 Hydroxy (Vit-D Deficiency, Fractures)    Patient has been counseled on age-appropriate routine health concerns for screening and prevention. These are reviewed and up-to-date. Referrals have been placed accordingly. Immunizations are up-to-date or declined.    Subjective:   Chief Complaint  Patient presents with   Hypertension   Hypertension Pertinent negatives include no blurred vision, chest pain, headaches, malaise/fatigue, palpitations or shortness of breath.   Maria Dominguez 72 y.o. female presents to office today for follow up to HTN She is accompanied by her daughter today.   VRI was used to communicate directly with patient for the entire encounter including providing detailed patient instructions.    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  She is followed  HTN Blood pressure  is elevated today. She does have a wrist cuff and reports normal BP readings at home but can not recall any specific readings. I recommended she bring her BP log to each visit for review. She is currently taking hyzaar 100-25 mg daily as prescribed.  BP Readings from Last 3 Encounters:  02/07/23 (!) 156/80  12/05/22 (!) 142/67  11/01/22 132/78     Vertigo Notes periodic episodes of vertigo. Occurring every few days. Lasting for a few minutes and goes away on its own. Aggravating factors: when rising or standing from lying position or turning head. Does not usually occur at rest. Negative for orthostasis today.   KNEE PAIN Endorses chronic left knee pain which is worse with walking. BMI 36.82. Knee pain has been constant over the past year. She denies any injury or trauma. She does have varicose veins present as well.    Osteoporosis She is aware she has osteoporosis. She is aware of the risk of decreased bone density in regard to fractures. At this time she would like to avoid prolia or fosamax due to history of gastric ulcer.    ASCVD She is taking omega 3 and rosuvastatin as prescribed.  The 10-year ASCVD risk score (Arnett DK, et al., 2019) is: 23.4%   Values used to calculate the score:     Age: 1 years     Sex: Female     Is Non-Hispanic African American: No     Diabetic: No     Tobacco smoker: No     Systolic Blood Pressure: 156 mmHg     Is BP treated:  Yes     HDL Cholesterol: 32 mg/dL     Total Cholesterol: 200 mg/dL   Review of Systems  Constitutional:  Negative for fever, malaise/fatigue and weight loss.  HENT: Negative.  Negative for nosebleeds.   Eyes: Negative.  Negative for blurred vision, double vision and photophobia.  Respiratory: Negative.  Negative for cough and shortness of breath.   Cardiovascular: Negative.  Negative for chest pain, palpitations and leg swelling.  Gastrointestinal: Negative.  Negative for heartburn, nausea and vomiting.  Musculoskeletal:   Positive for joint pain. Negative for myalgias.  Neurological:  Positive for dizziness. Negative for focal weakness, seizures and headaches.  Psychiatric/Behavioral: Negative.  Negative for suicidal ideas.     Past Medical History:  Diagnosis Date   Cancer [facility]) 2010   left breast cancer-lumpectomy   Complication of anesthesia    hard to wake after humerous   Diverticulitis of colon with perforation    Family history of breast cancer 06/30/2022   Fatty liver    GERD (gastroesophageal reflux disease)    Hx of breast cancer    Hyperlipidemia    Hypertension    Perforated gastric ulcer (HCC)    Personal history of breast cancer 06/30/2022    Past Surgical History:  Procedure Laterality Date   BREAST BIOPSY Right 03/16/2022   BREAST EXCISIONAL BIOPSY Left    unsure if patient has had breast ca, took tamoxifen   BREAST LUMPECTOMY WITH RADIOACTIVE SEED LOCALIZATION Right 04/21/2022   Procedure: RIGHT BREAST LUMPECTOMY WITH RADIOACTIVE SEED LOCALIZATION;  Surgeon: Manus Rudd, MD;  Location: [facility];  Service: General;  Laterality: Right;  LMA   HUMERUS FRACTURE SURGERY     left lumpectomy     MASS EXCISION Left    left axilla    Family History  Problem Relation Age of Onset   Diabetes Mother    Breast cancer Sister        d. 40s; twin sister (fraternal)   Liver cancer Cousin        double first cousins; x2; d. 39s-50s; alcohol abuse   Colon cancer Neg Hx    Esophageal cancer Neg Hx    Rectal cancer Neg Hx    Stomach cancer Neg Hx     Social History Reviewed with no changes to be made today.   Outpatient Medications Prior to Visit  Medication Sig Dispense Refill   anastrozole (ARIMIDEX) 1 MG tablet Take 1 tablet (1 mg total) by mouth daily. 90 tablet 3   ibuprofen (ADVIL) 400 MG tablet Take 400 mg by mouth every 6 (six) hours as needed.     rosuvastatin (CRESTOR) 20 MG tablet Take 1 tablet (20 mg total) by mouth daily. 90 tablet 3   cholecalciferol  (VITAMIN D3) 25 MCG (1000 UNIT) tablet Take 1 tablet (1,000 Units total) by mouth daily. 90 tablet 1   losartan-hydrochlorothiazide (HYZAAR) 100-25 MG tablet Take 1 tablet by mouth daily. 90 tablet 1   omega-3 acid ethyl esters (LOVAZA) 1 g capsule Take 1 capsule (1 g total) by mouth 2 (two) times daily. 180 capsule 1   omeprazole (PRILOSEC) 20 MG capsule Take 1 capsule (20 mg total) by mouth daily. 90 capsule 1   traZODone (DESYREL) 50 MG tablet Take 0.5-1 tablets (25-50 mg total) by mouth at bedtime as needed for sleep. (Patient not taking: Reported on 02/07/2023) 30 tablet 3   Facility-Administered Medications Prior to Visit  Medication Dose Route Frequency Provider Last Rate Last Admin  bupivacaine liposome (EXPAREL) 1.3 % injection 266 mg  20 mL Infiltration Once Almond Lint, MD        No Known Allergies     Objective:    BP (!) 156/80 (BP Location: Left Arm, Patient Position: Sitting, Cuff Size: Normal)   Pulse 65   Ht 4\' 7"  (1.397 m)   Wt 153 lb 12.8 oz (69.8 kg)   SpO2 99%   BMI 35.75 kg/m  Wt Readings from Last 3 Encounters:  02/07/23 153 lb 12.8 oz (69.8 kg)  12/05/22 153 lb 9.6 oz (69.7 kg)  11/01/22 156 lb 12.8 oz (71.1 kg)    Physical Exam Vitals and nursing note reviewed.  Constitutional:      Appearance: She is well-developed.  HENT:     Head: Normocephalic and atraumatic.     Right Ear: Hearing, tympanic membrane, ear canal and external ear normal.     Left Ear: Hearing, tympanic membrane, ear canal and external ear normal.  Cardiovascular:     Rate and Rhythm: Normal rate and regular rhythm.     Heart sounds: Normal heart sounds. No murmur heard.    No friction rub. No gallop.  Pulmonary:     Effort: Pulmonary effort is normal. No tachypnea or respiratory distress.     Breath sounds: Normal breath sounds. No decreased breath sounds, wheezing, rhonchi or rales.  Chest:     Chest wall: No tenderness.  Abdominal:     General: Bowel sounds are normal.      Palpations: Abdomen is soft.  Musculoskeletal:        General: Normal range of motion.     Cervical back: Normal range of motion.  Skin:    General: Skin is warm and dry.  Neurological:     Mental Status: She is alert and oriented to person, place, and time.     Coordination: Coordination normal.  Psychiatric:        Behavior: Behavior normal. Behavior is cooperative.        Thought Content: Thought content normal.        Judgment: Judgment normal.          Patient has been counseled extensively about nutrition and exercise as well as the importance of adherence with medications and regular follow-up. The patient was given clear instructions to go to ER or return to medical center if symptoms don't improve, worsen or new problems develop. The patient verbalized understanding.   Follow-up: Return in about 4 weeks (around 03/07/2023) for BP CHECK WITH LUKE.   Claiborne Rigg, FNP-BC [id number] [facility] [place], [place] [phone]   02/07/2023, 12:33 PM

## 2023-02-07 NOTE — Progress Notes (Signed)
Dizziness started a few days ago.  Left knee pain

## 2023-02-08 ENCOUNTER — Other Ambulatory Visit: Payer: Self-pay | Admitting: Nurse Practitioner

## 2023-02-08 DIAGNOSIS — Z1211 Encounter for screening for malignant neoplasm of colon: Secondary | ICD-10-CM

## 2023-02-08 DIAGNOSIS — R7989 Other specified abnormal findings of blood chemistry: Secondary | ICD-10-CM

## 2023-02-08 LAB — CMP14+EGFR
ALT: 11 IU/L (ref 0–32)
AST: 17 IU/L (ref 0–40)
Albumin/Globulin Ratio: 1.2 (ref 1.2–2.2)
Albumin: 4.1 g/dL (ref 3.8–4.8)
Alkaline Phosphatase: 155 IU/L — ABNORMAL HIGH (ref 44–121)
BUN/Creatinine Ratio: 25 (ref 12–28)
BUN: 20 mg/dL (ref 8–27)
Bilirubin Total: 0.6 mg/dL (ref 0.0–1.2)
CO2: 19 mmol/L — ABNORMAL LOW (ref 20–29)
Calcium: 8.9 mg/dL (ref 8.7–10.3)
Chloride: 102 mmol/L (ref 96–106)
Creatinine, Ser: 0.79 mg/dL (ref 0.57–1.00)
Globulin, Total: 3.4 g/dL (ref 1.5–4.5)
Glucose: 92 mg/dL (ref 70–99)
Potassium: 4.3 mmol/L (ref 3.5–5.2)
Sodium: 137 mmol/L (ref 134–144)
Total Protein: 7.5 g/dL (ref 6.0–8.5)
eGFR: 79 mL/min/{1.73_m2} (ref >59)

## 2023-02-08 LAB — VITAMIN D 25 HYDROXY (VIT D DEFICIENCY, FRACTURES): Vit D, 25-Hydroxy: 30.5 ng/mL (ref 30.0–100.0)

## 2023-02-09 ENCOUNTER — Ambulatory Visit (HOSPITAL_COMMUNITY): Payer: Self-pay

## 2023-02-09 ENCOUNTER — Ambulatory Visit (HOSPITAL_COMMUNITY)
Admission: RE | Admit: 2023-02-09 | Discharge: 2023-02-09 | Disposition: A | Discharge status: Home / Self Care | Payer: Self-pay | Source: Ambulatory Visit | Attending: Nurse Practitioner | Admitting: Nurse Practitioner

## 2023-02-09 DIAGNOSIS — R42 Dizziness and giddiness: Secondary | ICD-10-CM | POA: Insufficient documentation

## 2023-02-09 NOTE — Progress Notes (Signed)
VASCULAR LAB    Carotid duplex has been performed.  See CV proc for preliminary results.   Deema Juncaj, RVT 02/09/2023, 9:41 AM

## 2023-02-22 ENCOUNTER — Encounter: Payer: Self-pay | Admitting: Internal Medicine

## 2023-02-27 ENCOUNTER — Ambulatory Visit
Admission: RE | Admit: 2023-02-27 | Discharge: 2023-02-27 | Disposition: A | Discharge status: Home / Self Care | Payer: No Typology Code available for payment source | Source: Ambulatory Visit | Attending: Adult Health | Admitting: Adult Health

## 2023-02-27 ENCOUNTER — Other Ambulatory Visit: Payer: Self-pay

## 2023-02-27 ENCOUNTER — Emergency Department (HOSPITAL_COMMUNITY): Payer: No Typology Code available for payment source

## 2023-02-27 ENCOUNTER — Emergency Department (HOSPITAL_COMMUNITY)
Admission: EM | Admit: 2023-02-27 | Discharge: 2023-02-27 | Disposition: A | Discharge status: Home / Self Care | Payer: No Typology Code available for payment source | Attending: Emergency Medicine | Admitting: Emergency Medicine

## 2023-02-27 ENCOUNTER — Other Ambulatory Visit: Payer: Self-pay | Admitting: Adult Health

## 2023-02-27 ENCOUNTER — Encounter (HOSPITAL_COMMUNITY): Payer: Self-pay

## 2023-02-27 DIAGNOSIS — Z170 Estrogen receptor positive status [ER+]: Secondary | ICD-10-CM

## 2023-02-27 DIAGNOSIS — G8929 Other chronic pain: Secondary | ICD-10-CM

## 2023-02-27 DIAGNOSIS — M25562 Pain in left knee: Secondary | ICD-10-CM | POA: Insufficient documentation

## 2023-02-27 MED ORDER — NAPROXEN 500 MG PO TABS
500.0000 mg | ORAL_TABLET | Freq: Two times a day (BID) | ORAL | 0 refills | Status: DC
  Filled 2023-02-27: qty 30, 15d supply, fill #0

## 2023-02-27 MED ORDER — KETOROLAC TROMETHAMINE 60 MG/2ML IM SOLN
30.0000 mg | Freq: Once | INTRAMUSCULAR | Status: AC
  Administered 2023-02-27: 30 mg via INTRAMUSCULAR
  Filled 2023-02-27: qty 2

## 2023-02-27 NOTE — Discharge Instructions (Addendum)
Please follow-up outpatient with orthopedics for repeat assessment.  Utilize any immobilizer for walking, recommend NSAIDs for pain control.  XR imaging was negative for fracture or dislocation and your symptoms were not consistent with gout or infection in the joint.

## 2023-02-27 NOTE — Progress Notes (Signed)
Orthopedic Tech Progress Note Patient Details:  Maria Dominguez [birth year] [id number]  Ortho Devices Type of Ortho Device: Knee Immobilizer Ortho Device/Splint Location: LLE Ortho Device/Splint Interventions: Ordered, Application, Adjustment   Post Interventions Patient Tolerated: Well Instructions Provided: Care of device, Adjustment of device  [name] 02/27/2023, 11:13 AM

## 2023-02-27 NOTE — ED Notes (Signed)
Pt discharged home. Knee immobilizer placed to left knee per ortho tech. Discharge information discussed. No s/s of distress observed during discharge.

## 2023-02-27 NOTE — ED Provider Notes (Signed)
[place] EMERGENCY DEPARTMENT AT [facility] Provider Note   CSN: [id number] Arrival date & time: 02/27/23  4098     History  Chief Complaint  Patient presents with   Knee Pain    Maria Dominguez is a 72 y.o. female.   Knee Pain    72 year old female presenting to the emergency department with chronic left knee pain that is worsened over the last 3 weeks.  She denies any recent falls or trauma.  No swelling to the knee.  She endorses some pain with range of motion, worse at night.  She denies any history of gout.  She is not on anticoagulation.  She is able to ambulate and bear weight.  Home Medications Prior to Admission medications   Medication Sig Start Date End Date Taking? Authorizing Provider  naproxen (NAPROSYN) 500 MG tablet Take 1 tablet (500 mg total) by mouth 2 (two) times daily. 02/27/23  Yes Ernie Avena, MD  anastrozole (ARIMIDEX) 1 MG tablet Take 1 tablet (1 mg total) by mouth daily. 07/07/22   Rachel Moulds, MD  cholecalciferol (VITAMIN D3) 25 MCG (1000 UNIT) tablet Take 1 tablet (1,000 Units total) by mouth daily. 02/07/23   Claiborne Rigg, NP  ibuprofen (ADVIL) 400 MG tablet Take 400 mg by mouth every 6 (six) hours as needed.    [provider]  losartan-hydrochlorothiazide (HYZAAR) 100-25 MG tablet Take 1 tablet by mouth daily. 02/07/23   Claiborne Rigg, NP  omega-3 acid ethyl esters (LOVAZA) 1 g capsule Take 1 capsule (1 g total) by mouth 2 (two) times daily. 02/07/23   Claiborne Rigg, NP  omeprazole (PRILOSEC) 20 MG capsule Take 1 capsule (20 mg total) by mouth daily. 02/07/23   Claiborne Rigg, NP  rosuvastatin (CRESTOR) 20 MG tablet Take 1 tablet (20 mg total) by mouth daily. 08/01/22   Claiborne Rigg, NP  traZODone (DESYREL) 50 MG tablet Take 0.5-1 tablets (25-50 mg total) by mouth at bedtime as needed for sleep. Patient not taking: Reported on 02/07/2023 08/01/22   Claiborne Rigg, NP      Allergies    Patient has no known  allergies.    Review of Systems   Review of Systems  Musculoskeletal:  Positive for arthralgias.  All other systems reviewed and are negative.   Physical Exam Updated Vital Signs BP (!) 168/58 (BP Location: Right Arm)   Pulse 69   Temp 98.3 F (36.8 C) (Oral)   Resp 16   SpO2 98%  Physical Exam Vitals and nursing note reviewed.  Constitutional:      General: She is not in acute distress. HENT:     Head: Normocephalic and atraumatic.  Eyes:     Conjunctiva/sclera: Conjunctivae normal.     Pupils: Pupils are equal, round, and reactive to light.  Cardiovascular:     Rate and Rhythm: Normal rate and regular rhythm.  Pulmonary:     Effort: Pulmonary effort is normal. No respiratory distress.  Abdominal:     General: There is no distension.     Tenderness: There is no guarding.  Musculoskeletal:        General: Tenderness present. No deformity or signs of injury. Normal range of motion.     Cervical back: Neck supple.     Comments: Active and passive range of motion of the left knee intact, distally neurovascular intact, some tenderness over the medial meniscus, no palpable joint effusion, no erythema or warmth  Skin:  Findings: No lesion or rash.  Neurological:     General: No focal deficit present.     Mental Status: She is alert. Mental status is at baseline.     ED Results / Procedures / Treatments   Labs (all labs ordered are listed, but only abnormal results are displayed) Labs Reviewed - No data to display  EKG None  Radiology DG Knee Complete 4 Views Left  Result Date: 02/27/2023 CLINICAL DATA:  Left knee pain x3 weeks EXAM: LEFT KNEE - COMPLETE 4+ VIEW COMPARISON:  None Available. FINDINGS: No fracture or dislocation is seen. The joint spaces are preserved. No definite suprapatellar knee joint effusion. The visualized soft tissues are unremarkable. IMPRESSION: Negative. Electronically Signed   By: Charline Bills M.D.   On: 02/27/2023 10:36     Procedures Procedures    Medications Ordered in ED Medications  ketorolac (TORADOL) injection 30 mg (has no administration in time range)    ED Course/ Medical Decision Making/ A&P                             Medical Decision Making Amount and/or Complexity of Data Reviewed Radiology: ordered.  Risk Prescription drug management.    72 year old female presenting to the emergency department with chronic left knee pain that is worsened over the last 3 weeks.  She denies any recent falls or trauma.  No swelling to the knee.  She endorses some pain with range of motion, worse at night.  She denies any history of gout.  She is not on anticoagulation.  She is able to ambulate and bear weight.  On arrival, the patient was vitally stable, physical exam significant for a left knee with active and passive range of motion of the left knee intact, distally neurovascular intact, some tenderness over the medial meniscus, no palpable joint effusion, no erythema or warmth.   Suspect osteoarthritis versus meniscal injury.  Low concern for septic arthritis, gout flare with no evidence on exam.  Lower concern for acute fracture or dislocation.  X-ray imaging of the left knee was performed and was negative.  She was administered IM Toradol.  She was provided with a knee immobilizer for assistance with ambulation.  Recommended outpatient follow-up with orthopedics.  Overall stable for discharge and outpatient management.   Final Clinical Impression(s) / ED Diagnoses Final diagnoses:  Chronic pain of left knee    Rx / DC Orders ED Discharge Orders          Ordered    naproxen (NAPROSYN) 500 MG tablet  2 times daily        02/27/23 1054    Ambulatory referral to Orthopedics        02/27/23 1057              Ernie Avena, MD 02/27/23 1058

## 2023-02-27 NOTE — ED Triage Notes (Signed)
C/o chronic left knee pain that worsened x3 weeks ago.  Denies injury/trauma.  No swelling noted.  Denies blood thinner usage

## 2023-03-03 ENCOUNTER — Ambulatory Visit
Admission: RE | Admit: 2023-03-03 | Discharge: 2023-03-03 | Disposition: A | Discharge status: Home / Self Care | Payer: No Typology Code available for payment source | Source: Ambulatory Visit | Attending: Adult Health | Admitting: Adult Health

## 2023-03-03 DIAGNOSIS — Z170 Estrogen receptor positive status [ER+]: Secondary | ICD-10-CM

## 2023-03-03 HISTORY — PX: BREAST BIOPSY: SHX20

## 2023-03-07 ENCOUNTER — Other Ambulatory Visit: Payer: Self-pay

## 2023-03-14 ENCOUNTER — Other Ambulatory Visit: Payer: Self-pay

## 2023-03-20 ENCOUNTER — Other Ambulatory Visit: Payer: Self-pay

## 2023-04-03 ENCOUNTER — Other Ambulatory Visit: Payer: Self-pay

## 2023-04-03 ENCOUNTER — Ambulatory Visit (AMBULATORY_SURGERY_CENTER): Payer: Self-pay

## 2023-04-03 VITALS — Ht <= 58 in | Wt 150.0 lb

## 2023-04-03 DIAGNOSIS — Z1211 Encounter for screening for malignant neoplasm of colon: Secondary | ICD-10-CM

## 2023-04-03 MED ORDER — NA SULFATE-K SULFATE-MG SULF 17.5-3.13-1.6 GM/177ML PO SOLN
1.0000 | Freq: Once | ORAL | 0 refills | Status: AC
  Filled 2023-04-03 – 2023-05-03 (×3): qty 354, 1d supply, fill #0

## 2023-04-03 NOTE — Progress Notes (Signed)

## 2023-04-10 ENCOUNTER — Other Ambulatory Visit: Payer: Self-pay

## 2023-04-10 ENCOUNTER — Encounter (HOSPITAL_COMMUNITY): Payer: Self-pay | Admitting: Emergency Medicine

## 2023-04-10 ENCOUNTER — Other Ambulatory Visit (HOSPITAL_COMMUNITY): Payer: Self-pay

## 2023-04-10 ENCOUNTER — Emergency Department (HOSPITAL_COMMUNITY)
Admission: EM | Admit: 2023-04-10 | Discharge: 2023-04-10 | Disposition: A | Discharge status: Home / Self Care | Payer: Self-pay | Attending: Emergency Medicine | Admitting: Emergency Medicine

## 2023-04-10 DIAGNOSIS — Z79899 Other long term (current) drug therapy: Secondary | ICD-10-CM | POA: Insufficient documentation

## 2023-04-10 DIAGNOSIS — I10 Essential (primary) hypertension: Secondary | ICD-10-CM | POA: Insufficient documentation

## 2023-04-10 DIAGNOSIS — M25562 Pain in left knee: Secondary | ICD-10-CM | POA: Insufficient documentation

## 2023-04-10 DIAGNOSIS — G8929 Other chronic pain: Secondary | ICD-10-CM | POA: Insufficient documentation

## 2023-04-10 DIAGNOSIS — Z853 Personal history of malignant neoplasm of breast: Secondary | ICD-10-CM | POA: Insufficient documentation

## 2023-04-10 MED ORDER — ETODOLAC 400 MG PO TABS
400.0000 mg | ORAL_TABLET | Freq: Two times a day (BID) | ORAL | 0 refills | Status: AC
  Filled 2023-04-10: qty 20, 10d supply, fill #0

## 2023-04-10 NOTE — ED Provider Notes (Signed)
[place] EMERGENCY DEPARTMENT AT [facility] Provider Note   CSN: [id number] Arrival date & time: 04/10/23  1653     History  Chief Complaint  Patient presents with   Knee Pain    Maria Dominguez is a 72 y.o. female.   Knee Pain    Patient has a history of hypertension diverticulitis, hyperlipidemia, reflux, breast cancer.  She presents to the ED for evaluation of knee pain.  Patient was seen in urgent care on May 20 for similar symptoms.  She had x-rays that did not show any acute abnormalities.  Patient subsequently was referred to an orthopedist.  Patient had a steroid injection of her knee.  Patient was told that that should resolve her symptoms.  She felt better for about a day but then since then is having more persistent sharp pain in her left knee.  It hurts mostly on the inner aspect of her left knee.  It increases when she tries to bend it and move it.  She has not had any recent falls.  No fevers or chills.  Home Medications Prior to Admission medications   Medication Sig Start Date End Date Taking? Authorizing Provider  etodolac (LODINE) 400 MG tablet Take 1 tablet (400 mg total) by mouth 2 (two) times daily for 10 days. 04/10/23 04/20/23 Yes Linwood Dibbles, MD  anastrozole (ARIMIDEX) 1 MG tablet Take 1 tablet (1 mg total) by mouth daily. 07/07/22   Rachel Moulds, MD  cholecalciferol (VITAMIN D3) 25 MCG (1000 UNIT) tablet Take 1 tablet (1,000 Units total) by mouth daily. 02/07/23   Claiborne Rigg, NP  ibuprofen (ADVIL) 400 MG tablet Take 400 mg by mouth every 6 (six) hours as needed.    [provider]  losartan-hydrochlorothiazide (HYZAAR) 100-25 MG tablet Take 1 tablet by mouth daily. 02/07/23   Claiborne Rigg, NP  omega-3 acid ethyl esters (LOVAZA) 1 g capsule Take 1 capsule (1 g total) by mouth 2 (two) times daily. 02/07/23   Claiborne Rigg, NP  omeprazole (PRILOSEC) 20 MG capsule Take 1 capsule (20 mg total) by mouth daily. 02/07/23   Claiborne Rigg,  NP  rosuvastatin (CRESTOR) 20 MG tablet Take 1 tablet (20 mg total) by mouth daily. 08/01/22   Claiborne Rigg, NP  traZODone (DESYREL) 50 MG tablet Take 0.5-1 tablets (25-50 mg total) by mouth at bedtime as needed for sleep. Patient not taking: Reported on 02/07/2023 08/01/22   Claiborne Rigg, NP      Allergies    Patient has no known allergies.    Review of Systems   Review of Systems  Physical Exam Updated Vital Signs BP (!) 137/97   Pulse 98   Temp 98.4 F (36.9 C) (Oral)   Resp 16   Ht 1.397 m (4\' 7" )   Wt 68 kg   SpO2 98%   BMI 34.84 kg/m  Physical Exam Vitals and nursing note reviewed.  Constitutional:      General: She is not in acute distress.    Appearance: She is well-developed.  HENT:     Head: Normocephalic and atraumatic.     Right Ear: External ear normal.     Left Ear: External ear normal.  Eyes:     General: No scleral icterus.       Right eye: No discharge.        Left eye: No discharge.     Conjunctiva/sclera: Conjunctivae normal.  Neck:     Trachea: No tracheal  deviation.  Cardiovascular:     Rate and Rhythm: Normal rate.  Pulmonary:     Effort: Pulmonary effort is normal. No respiratory distress.     Breath sounds: No stridor.  Abdominal:     General: There is no distension.  Musculoskeletal:        General: Tenderness present. No swelling or deformity.     Cervical back: Neck supple.     Comments: Palpation medial aspect of left knee, no erythema, no edema, distal perfusion normal  Skin:    General: Skin is warm and dry.     Findings: No rash.  Neurological:     Mental Status: She is alert. Mental status is at baseline.     Cranial Nerves: No dysarthria or facial asymmetry.     Motor: No seizure activity.     ED Results / Procedures / Treatments   Labs (all labs ordered are listed, but only abnormal results are displayed) Labs Reviewed - No data to display  EKG None  Radiology No results found.  Procedures Procedures     Medications Ordered in ED Medications - No data to display  ED Course/ Medical Decision Making/ A&P                             Medical Decision Making  Patient presented to the ED for evaluation of persistent knee pain.  Patient had x-rays over a month ago.  No acute abnormalities were noted then.  Patient's not had any falls or injury.  I do not think she requires repeat imaging.  Patient does not have any evidence of erythema or edema.  No findings to suggest acute infection.  No significant effusion or deformity noted on exam.  Patient does have some pain with bending her knee but is able to do so with full range of motion.  Suspect patient could have either ligamental or meniscal irritation.  She has seen orthopedics previously and I think she is stable to follow-up with orthopedics.  Will give her crutches to help keep her weight off her knee.  Will prescribe an anti-inflammatory pain medication for comfort.        Final Clinical Impression(s) / ED Diagnoses Final diagnoses:  Chronic pain of left knee    Rx / DC Orders ED Discharge Orders          Ordered    etodolac (LODINE) 400 MG tablet  2 times daily        04/10/23 1747              Linwood Dibbles, MD 04/10/23 1747

## 2023-04-10 NOTE — Progress Notes (Signed)
Orthopedic Tech Progress Note Patient Details:  Maria Dominguez Jul 21, 1951 [id number]  Delivered crutches to pt in ED. Taught pt how to use them. Ortho Devices Type of Ortho Device: Crutches      Sherilyn Banker 04/10/2023, 6:24 PM

## 2023-04-10 NOTE — ED Triage Notes (Signed)
Using interpreter, pt reports left knee pain. States she is unable to walk on it. Seen in May for chronic knee pain but states the pain is much worse now. Saw a ortho doc and given shot.

## 2023-04-10 NOTE — Discharge Instructions (Addendum)
Take the medication as needed for pain.  Follow-up with an orthopedic doctor for further evaluation

## 2023-04-11 ENCOUNTER — Other Ambulatory Visit (HOSPITAL_COMMUNITY): Payer: Self-pay

## 2023-04-11 ENCOUNTER — Other Ambulatory Visit: Payer: Self-pay

## 2023-04-26 ENCOUNTER — Other Ambulatory Visit: Payer: Self-pay

## 2023-05-02 ENCOUNTER — Other Ambulatory Visit: Payer: Self-pay

## 2023-05-03 ENCOUNTER — Other Ambulatory Visit: Payer: Self-pay

## 2023-05-04 ENCOUNTER — Other Ambulatory Visit: Payer: Self-pay

## 2023-05-10 ENCOUNTER — Other Ambulatory Visit: Payer: Self-pay

## 2023-05-19 ENCOUNTER — Encounter: Payer: Self-pay | Admitting: Internal Medicine

## 2023-06-05 ENCOUNTER — Inpatient Hospital Stay: Payer: Self-pay | Attending: Hematology and Oncology | Admitting: Hematology and Oncology

## 2023-06-05 VITALS — BP 137/69 | HR 64 | Temp 98.2°F | Resp 16 | Wt 145.9 lb

## 2023-06-05 DIAGNOSIS — M25562 Pain in left knee: Secondary | ICD-10-CM | POA: Insufficient documentation

## 2023-06-05 DIAGNOSIS — Z170 Estrogen receptor positive status [ER+]: Secondary | ICD-10-CM | POA: Insufficient documentation

## 2023-06-05 DIAGNOSIS — Z803 Family history of malignant neoplasm of breast: Secondary | ICD-10-CM | POA: Insufficient documentation

## 2023-06-05 DIAGNOSIS — C50411 Malignant neoplasm of upper-outer quadrant of right female breast: Secondary | ICD-10-CM | POA: Insufficient documentation

## 2023-06-05 DIAGNOSIS — Z923 Personal history of irradiation: Secondary | ICD-10-CM | POA: Insufficient documentation

## 2023-06-05 DIAGNOSIS — Z800 Family history of malignant neoplasm of digestive organs: Secondary | ICD-10-CM | POA: Insufficient documentation

## 2023-06-05 DIAGNOSIS — Z79811 Long term (current) use of aromatase inhibitors: Secondary | ICD-10-CM | POA: Insufficient documentation

## 2023-06-05 DIAGNOSIS — R232 Flushing: Secondary | ICD-10-CM | POA: Insufficient documentation

## 2023-06-05 NOTE — Assessment & Plan Note (Addendum)
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 She underwent lumpectomy/radiation. She had taken tamoxifen in the past and had endometrial hyperplasia and bleeding complications.  She is now on adjuvant anastrozole, tolerating it well except for hot flashes and arthalgias. She is absolutely refusing to see dentist for dental clearance. Baseline bone density showed osteoporosis. She wants to continue ca/vit D and weight bearing exercises.Dental clearance form given incase she decides to see them. I encouraged her to stay in touch with the orthopedics team regarding the left knee pain which is intermittent.  RTC in 6 months.

## 2023-06-05 NOTE — Progress Notes (Signed)
[facility] CONSULT NOTE  Patient Care Team: Claiborne Rigg, NP as PCP - General (Nurse Practitioner) Rachel Moulds, MD as Consulting Physician (Hematology and Oncology) Manus Rudd, MD as Consulting Physician (General Surgery) Lonie Peak, MD as Attending Physician (Radiation Oncology) Loa Socks, NP as Nurse Practitioner (Hematology and Oncology)  CHIEF COMPLAINTS/PURPOSE OF CONSULTATION:  Breast cancer follow up.  HISTORY OF PRESENTING ILLNESS:   Maria Dominguez 72 y.o. female is here because of recent diagnosis of right breast IDC.  I reviewed her records extensively and collaborated the history with the patient.  SUMMARY OF ONCOLOGIC HISTORY: Oncology History  Malignant neoplasm of upper-outer quadrant of right female breast (HCC)  01/27/2022 Mammogram   Bilateral screening mammogram with possible mass in the right breast. In the left breast, possible masses requiring further evaluation. Diagnostic mammogram persistent oval, spiculated hyperdense mass in the upperouter right breast at posterior depth. Circumscribed masses in the deep central left breast are smaller on today's additional views, similar to multiple prior studies. Targeted ultrasound is performed, showing an oval, circumscribed hypoechoic mass with associated vascularity at the 9:30 position 10 cm from the nipple. It measures 1.2 x 1.0 x 0.9 cm. This corresponds with the mammographic finding. Evaluation of the right axilla demonstrates no suspicious findings.   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  Resection margins negative for carcinoma.  Overall grade 1.  Prior prognostic showed ER 100% strong staining, PR 100% strong staining, Ki-67 of 2% and HER2 of 0   04/21/2022 Cancer Staging   Staging form: Breast, AJCC 8th  Edition - Pathologic stage from 04/21/2022: Stage IA (pT1a, pN0, cM0, G1, ER+, PR+, HER2-) - Signed by Loa Socks, NP on 10/06/2022 Stage prefix: Initial diagnosis Histologic grading system: 3 grade system   05/30/2022 - 06/20/2022 Radiation Therapy   Site Technique Total Dose (Gy) Dose per Fx (Gy) Completed Fx Beam Energies  Breast, Right: Breast_R 3D 40.05/40.05 2.67 15/15 10X, 6XFFF     07/09/2022 Genetic Testing   Negative hereditary cancer genetic testing: no pathogenic variants detected in [facility] CustomNext-Cancer +RNAinsight Panel.  Report date is July 09, 2022.   Negative hereditary cancer genetic testing: no pathogenic variants detected in [facility] CustomNext-Cancer +RNAinsight Panel.  Report date is July 09, 2022.   The CustomNext-Cancer+RNAinsight panel offered by [employer] [facility] includes sequencing and rearrangement analysis for the following 47 genes:  APC, ATM, AXIN2, BARD1, BMPR1A, BRCA1, BRCA2, BRIP1, CDH1, CDK4, CDKN2A, CHEK2, DICER1, EPCAM, GREM1, HOXB13, MEN1, MLH1, MSH2, MSH3, MSH6, MUTYH, NBN, NF1, NF2, NTHL1, PALB2, PMS2, POLD1, POLE, PTEN, RAD51C, RAD51D, RECQL, RET, SDHA, SDHAF2, SDHB, SDHC, SDHD, SMAD4, SMARCA4, STK11, TP53, TSC1, TSC2, and VHL.  RNA data is routinely analyzed for use in variant interpretation for all genes.   08/2022 -  Anti-estrogen oral therapy   Anastrozole    She is from [place]. She mentions history of breast cancer on the left side about 13 years ago when she had surgery on the left, did not need any adjuvant chemoradiation but took tamoxifen for about 3 years.  She had some endometrial hyperplasia and bleeding complications related to this and hence tamoxifen was discontinued after 3 years.  She also reports breast cancer in her twin sister who died in her 59s, several family members with cancer including pancreatic cancer.  She completed adjuvant radiation and is now on  anastrozole.  She is compliant with anastrozole. She  reports hot flashes and bone aches from this medication. She had xray of the left knee and had a orthopedic visit, got steroid injection. She also had a mammogram, [place] and biopsy, benign findings.  Rest of the pertinent 10 point ROS reviewed and negative.  MEDICAL HISTORY:  Past Medical History:  Diagnosis Date   Cancer [facility]) 2010   left breast cancer-lumpectomy   Complication of anesthesia    hard to wake after humerous   Diverticulitis of colon with perforation    Family history of breast cancer 06/30/2022   Fatty liver    GERD (gastroesophageal reflux disease)    Hx of breast cancer    Hyperlipidemia    Hypertension    Perforated gastric ulcer (HCC)    Personal history of breast cancer 06/30/2022    SURGICAL HISTORY: Past Surgical History:  Procedure Laterality Date   BREAST BIOPSY Right 03/16/2022   BREAST BIOPSY Right 03/03/2023   MM RT BREAST BX W LOC DEV 1ST LESION IMAGE BX SPEC STEREO GUIDE 03/03/2023 [facility]   BREAST EXCISIONAL BIOPSY Left    unsure if patient has had breast ca, took tamoxifen   BREAST LUMPECTOMY WITH RADIOACTIVE SEED LOCALIZATION Right 04/21/2022   Procedure: RIGHT BREAST LUMPECTOMY WITH RADIOACTIVE SEED LOCALIZATION;  Surgeon: Manus Rudd, MD;  Location: [facility];  Service: General;  Laterality: Right;  LMA   HUMERUS FRACTURE SURGERY Right    left lumpectomy     MASS EXCISION Left    left axilla    SOCIAL HISTORY: Social History   Socioeconomic History   Marital status: Single    Spouse name: Not on file   Number of children: 2   Years of education: Not on file   Highest education level: 10th grade  Occupational History   Not on file  Tobacco Use   Smoking status: Never   Smokeless tobacco: Never  Vaping Use   Vaping status: Never Used  Substance and Sexual Activity   Alcohol use: Never   Drug use: Never   Sexual activity: Not Currently    Birth control/protection: Post-menopausal  Other Topics Concern    Not on file  Social History Narrative   Not on file   Social Determinants of Health   Financial Resource Strain: Not on file  Food Insecurity: Food Insecurity Present (03/15/2022)   Hunger Vital Sign    Worried About Running Out of Food in the Last Year: Sometimes true    Ran Out of Food in the Last Year: Sometimes true  Transportation Needs: No Transportation Needs (03/15/2022)   PRAPARE - Administrator, Civil Service (Medical): No    Lack of Transportation (Non-Medical): No  Physical Activity: Not on file  Stress: Not on file  Social Connections: Not on file  Intimate Partner Violence: Not on file    FAMILY HISTORY: Family History  Problem Relation Age of Onset   Diabetes Mother    Breast cancer Sister        d. 46s; twin sister (fraternal)   Liver cancer Cousin        double first cousins; x2; d. 52s-50s; alcohol abuse   Colon cancer Neg Hx    Esophageal cancer Neg Hx    Rectal cancer Neg Hx    Stomach cancer Neg Hx    Colon polyps Neg Hx    Heart disease Neg Hx     ALLERGIES:  has No Known  Allergies.  MEDICATIONS:  Current Outpatient Medications  Medication Sig Dispense Refill   anastrozole (ARIMIDEX) 1 MG tablet Take 1 tablet (1 mg total) by mouth daily. 90 tablet 3   cholecalciferol (VITAMIN D3) 25 MCG (1000 UNIT) tablet Take 1 tablet (1,000 Units total) by mouth daily. 90 tablet 1   ibuprofen (ADVIL) 400 MG tablet Take 400 mg by mouth every 6 (six) hours as needed.     losartan-hydrochlorothiazide (HYZAAR) 100-25 MG tablet Take 1 tablet by mouth daily. 90 tablet 1   omega-3 acid ethyl esters (LOVAZA) 1 g capsule Take 1 capsule (1 g total) by mouth 2 (two) times daily. 180 capsule 1   omeprazole (PRILOSEC) 20 MG capsule Take 1 capsule (20 mg total) by mouth daily. 90 capsule 1   rosuvastatin (CRESTOR) 20 MG tablet Take 1 tablet (20 mg total) by mouth daily. 90 tablet 3   traZODone (DESYREL) 50 MG tablet Take 0.5-1 tablets (25-50 mg total) by mouth at  bedtime as needed for sleep. (Patient not taking: Reported on 02/07/2023) 30 tablet 3   No current facility-administered medications for this visit.   Facility-Administered Medications Ordered in Other Visits  Medication Dose Route Frequency Provider Last Rate Last Admin   bupivacaine liposome (EXPAREL) 1.3 % injection 266 mg  20 mL Infiltration Once Almond Lint, MD        REVIEW OF SYSTEMS:   Constitutional: Denies fevers, chills or abnormal night sweats Eyes: Denies blurriness of vision, double vision or watery eyes Ears, nose, mouth, throat, and face: Denies mucositis or sore throat Respiratory: Denies cough, dyspnea or wheezes Cardiovascular: Denies palpitation, chest discomfort or lower extremity swelling Gastrointestinal:  Denies nausea, heartburn or change in bowel habits Skin: Denies abnormal skin rashes Lymphatics: Denies new lymphadenopathy or easy bruising Neurological:Denies numbness, tingling or new weaknesses Behavioral/Psych: Mood is stable, no new changes  Breast: Denies any palpable lumps or discharge All other systems were reviewed with the patient and are negative.  PHYSICAL EXAMINATION: ECOG PERFORMANCE STATUS: 0 - Asymptomatic  Vitals:   06/05/23 0927  BP: 137/69  Pulse: 64  Resp: 16  Temp: 98.2 F (36.8 C)  SpO2: 94%    Filed Weights   06/05/23 0927  Weight: 145 lb 14.4 oz (66.2 kg)     GENERAL:alert, no distress and comfortable Right breast status post surgery and some post surgical and radiation changes.  No palpable breast masses.  No regional adenopathy. No lower extremity edema  LABORATORY DATA:  I have reviewed the data as listed Lab Results  Component Value Date   WBC 7.2 04/29/2022   HGB 12.6 04/29/2022   HCT 38.7 04/29/2022   MCV 81 04/29/2022   PLT 370 04/29/2022   Lab Results  Component Value Date   NA 137 02/07/2023   K 4.3 02/07/2023   CL 102 02/07/2023   CO2 19 (L) 02/07/2023    RADIOGRAPHIC STUDIES: I have  personally reviewed the radiological reports and agreed with the findings in the report.  ASSESSMENT AND PLAN:   Malignant neoplasm of upper-outer quadrant of right female breast (HCC) This is a very pleasant 72 year old female patient with newly diagnosed right breast upper outer quadrant invasive carcinoma in association with intracystic papillary carcinoma, overall grade 1, ER 100% strong staining PR 100% strong staining, Ki-67 of 2% and HER2 of 0 referred to medical oncology for additional recommendations. She underwent lumpectomy/radiation. She had taken tamoxifen in the past and had endometrial hyperplasia and bleeding complications.  She is now  on adjuvant anastrozole, tolerating it well except for hot flashes and arthalgias. She is absolutely refusing to see dentist for dental clearance. Baseline bone density showed osteoporosis. She wants to continue ca/vit D and weight bearing exercises.Dental clearance form given incase she decides to see them. I encouraged her to stay in touch with the orthopedics team regarding the left knee pain which is intermittent.  RTC in 6 months.  Total time spent: 30 minutes including history, review of records, counseling and coordination of care All questions were answered. The patient knows to call the clinic with any problems, questions or concerns.    Rachel Moulds, MD 06/05/23

## 2023-07-04 ENCOUNTER — Encounter: Payer: Self-pay | Admitting: Internal Medicine

## 2023-12-07 ENCOUNTER — Inpatient Hospital Stay: Payer: Self-pay | Attending: Hematology and Oncology | Admitting: Hematology and Oncology

## 2023-12-14 ENCOUNTER — Other Ambulatory Visit (HOSPITAL_COMMUNITY): Payer: Self-pay

## 2023-12-14 ENCOUNTER — Other Ambulatory Visit: Payer: Self-pay

## 2024-01-08 ENCOUNTER — Ambulatory Visit: Payer: Self-pay | Attending: Nurse Practitioner | Admitting: Nurse Practitioner

## 2024-01-08 ENCOUNTER — Other Ambulatory Visit: Payer: Self-pay

## 2024-01-08 ENCOUNTER — Encounter: Payer: Self-pay | Admitting: Nurse Practitioner

## 2024-01-08 VITALS — BP 119/75 | HR 90 | Resp 19 | Ht <= 58 in | Wt 151.6 lb

## 2024-01-08 DIAGNOSIS — D649 Anemia, unspecified: Secondary | ICD-10-CM

## 2024-01-08 DIAGNOSIS — Z1211 Encounter for screening for malignant neoplasm of colon: Secondary | ICD-10-CM

## 2024-01-08 DIAGNOSIS — E785 Hyperlipidemia, unspecified: Secondary | ICD-10-CM

## 2024-01-08 DIAGNOSIS — I10 Essential (primary) hypertension: Secondary | ICD-10-CM

## 2024-01-08 DIAGNOSIS — E559 Vitamin D deficiency, unspecified: Secondary | ICD-10-CM

## 2024-01-08 LAB — GLUCOSE, POCT (MANUAL RESULT ENTRY): POC Glucose: 117 mg/dL — AB (ref 70–99)

## 2024-01-08 MED ORDER — VITAMIN D 25 MCG (1000 UNIT) PO TABS
1000.0000 [IU] | ORAL_TABLET | Freq: Every day | ORAL | 1 refills | Status: AC
Start: 2024-01-08 — End: ?
  Filled 2024-01-08 (×2): qty 90, 90d supply, fill #0

## 2024-01-08 MED ORDER — VITAMIN D 25 MCG (1000 UNIT) PO TABS
1000.0000 [IU] | ORAL_TABLET | Freq: Every day | ORAL | 1 refills | Status: DC
Start: 2024-01-08 — End: 2024-01-08
  Filled 2024-01-08: qty 100, 100d supply, fill #0

## 2024-01-08 MED ORDER — ROSUVASTATIN CALCIUM 20 MG PO TABS
20.0000 mg | ORAL_TABLET | Freq: Every day | ORAL | 3 refills | Status: AC
Start: 2024-01-08 — End: ?
  Filled 2024-01-08: qty 90, 90d supply, fill #0

## 2024-01-08 MED ORDER — OMEGA-3-ACID ETHYL ESTERS 1 G PO CAPS
1.0000 g | ORAL_CAPSULE | Freq: Two times a day (BID) | ORAL | 1 refills | Status: DC
Start: 2024-01-08 — End: 2024-01-08
  Filled 2024-01-08: qty 60, 30d supply, fill #0

## 2024-01-08 MED ORDER — OMEGA-3-ACID ETHYL ESTERS 1 G PO CAPS
1.0000 g | ORAL_CAPSULE | Freq: Two times a day (BID) | ORAL | 1 refills | Status: AC
Start: 2024-01-08 — End: ?

## 2024-01-08 MED ORDER — LOSARTAN POTASSIUM-HCTZ 100-25 MG PO TABS
1.0000 | ORAL_TABLET | Freq: Every day | ORAL | 1 refills | Status: DC
  Filled 2024-01-08: qty 90, 90d supply, fill #0

## 2024-01-08 MED ORDER — LOSARTAN POTASSIUM-HCTZ 100-25 MG PO TABS
1.0000 | ORAL_TABLET | Freq: Every day | ORAL | 1 refills | Status: DC
  Filled 2024-01-08 – 2024-04-04 (×2): qty 90, 90d supply, fill #0
  Filled 2024-06-21 – 2024-06-25 (×3): qty 90, 90d supply, fill #1

## 2024-01-08 NOTE — Patient Instructions (Signed)
 [facility] Located in: [place] [facility] [address] Address: [address], [place], [place] [address] Phone: [phone]

## 2024-01-08 NOTE — Progress Notes (Signed)
 Assessment & Plan:  Maria Dominguez was seen today for medical management of chronic issues.  Diagnoses and all orders for this visit:  Primary hypertension -     CMP14+EGFR -     Ambulatory referral to Gastroenterology -     losartan-hydrochlorothiazide (HYZAAR) 100-25 MG tablet; Take 1 tablet by mouth daily. Continue all antihypertensives as prescribed.  Reminded to bring in blood pressure log for follow  up appointment.  RECOMMENDATIONS: DASH/Mediterranean Diets are healthier choices for HTN.    Anemia, unspecified type -     CBC with Differential -     POCT glucose (manual entry)  Vitamin D deficiency disease -     VITAMIN D 25 Hydroxy (Vit-D Deficiency, Fractures) -     Discontinue: cholecalciferol (VITAMIN D3) 25 MCG (1000 UNIT) tablet; Take 1 tablet (1,000 Units total) by mouth daily. -     cholecalciferol (VITAMIN D3) 25 MCG (1000 UNIT) tablet; Take 1 tablet (1,000 Units total) by mouth daily.  Hyperlipidemia, unspecified hyperlipidemia type -     omega-3 acid ethyl esters (LOVAZA) 1 g capsule; Take 1 capsule (1 g total) by mouth 2 (two) times daily. -     rosuvastatin (CRESTOR) 20 MG tablet; Take 1 tablet (20 mg total) by mouth daily. INSTRUCTIONS: Work on a low fat, heart healthy diet and participate in regular aerobic exercise program by working out at least 150 minutes per week; 5 days a week-30 minutes per day. Avoid red meat/beef/steak,  fried foods. junk foods, sodas, sugary drinks, unhealthy snacking, alcohol and smoking.  Drink at least 80 oz of water per day and monitor your carbohydrate intake daily.    Colon cancer screening Schedule with GI for colonoscopy/ Phone number for [facility] printed on AVS -     Fecal occult blood, imunochemical([facility])   Patient has been counseled on age-appropriate routine health concerns for screening and prevention. These are reviewed and up-to-date. Referrals have been placed accordingly. Immunizations are up-to-date or declined.     Subjective:   Chief Complaint  Patient presents with   Medical Management of Chronic Issues    Maria Dominguez 73 y.o. female presents to office today for follow up to HTN She is accompanied by her family member today who is translating for her   She has a past medical history of left breast cancer (2010) s/p lumpectomy, Right breast papillary CA s/p right radioactive seed implantation after lumpectomy, Diverticulitis of colon with perforation, Fatty liver, GERD,  Hyperlipidemia, Hypertension, and Perforated gastric ulcer.    HTN Blood pressure is elevated today. She does have a wrist cuff and reports normal BP readings at home but can not recall any specific readings. I recommended she bring her BP log to each visit for review. She is currently taking hyzaar 100-25 mg daily as prescribed.  BP Readings from Last 3 Encounters:  01/08/24 (!) 142/80  06/05/23 137/69  04/10/23 (!) 147/92     KNEE PAIN Endorses chronic left knee pain which is worse with walking. BMI 36.82. Knee pain has been constant over the past year. She denies any injury or trauma. She does have varicose veins present as well. Xrays have been negative.    Osteoporosis She is aware she has osteoporosis. She is aware of the risk of decreased bone density in regard to fractures. At this time she would like to avoid prolia or fosamax due to history of gastric ulcer. Will repeat BDT in January.   Has noticed darker colored stools over the past  few weeks. She is overdue for her colonoscopy as of last year. Will have her schedule with GI as she has already been referred. Notes RLQ pain. Sometimes feels something hard area in her stomach and has to massage her stomach and it helps her feel better. She does not have daily bowel movements.    Review of Systems  Constitutional:  Negative for fever, malaise/fatigue and weight loss.  HENT: Negative.  Negative for nosebleeds.   Eyes: Negative.  Negative for blurred vision, double  vision and photophobia.  Respiratory: Negative.  Negative for cough and shortness of breath.   Cardiovascular: Negative.  Negative for chest pain, palpitations and leg swelling.  Gastrointestinal:  Positive for abdominal pain, constipation and heartburn. Negative for blood in stool, diarrhea, melena, nausea and vomiting.  Musculoskeletal: Negative.  Negative for myalgias.  Neurological: Negative.  Negative for dizziness, focal weakness, seizures and headaches.  Psychiatric/Behavioral: Negative.  Negative for suicidal ideas.     Past Medical History:  Diagnosis Date   Cancer [facility]) 2010   left breast cancer-lumpectomy   Complication of anesthesia    hard to wake after humerous   Diverticulitis of colon with perforation    Family history of breast cancer 06/30/2022   Fatty liver    GERD (gastroesophageal reflux disease)    Hx of breast cancer    Hyperlipidemia    Hypertension    Perforated gastric ulcer (HCC)    Personal history of breast cancer 06/30/2022    Past Surgical History:  Procedure Laterality Date   BREAST BIOPSY Right 03/16/2022   BREAST BIOPSY Right 03/03/2023   MM RT BREAST BX W LOC DEV 1ST LESION IMAGE BX SPEC STEREO GUIDE 03/03/2023 [facility]   BREAST EXCISIONAL BIOPSY Left    unsure if patient has had breast ca, took tamoxifen   BREAST LUMPECTOMY WITH RADIOACTIVE SEED LOCALIZATION Right 04/21/2022   Procedure: RIGHT BREAST LUMPECTOMY WITH RADIOACTIVE SEED LOCALIZATION;  Surgeon: Manus Rudd, MD;  Location: [facility];  Service: General;  Laterality: Right;  LMA   HUMERUS FRACTURE SURGERY Right    left lumpectomy     MASS EXCISION Left    left axilla    Family History  Problem Relation Age of Onset   Diabetes Mother    Breast cancer Sister        d. 68s; twin sister (fraternal)   Liver cancer Cousin        double first cousins; x2; d. 66s-50s; alcohol abuse   Colon cancer Neg Hx    Esophageal cancer Neg Hx    Rectal cancer Neg Hx     Stomach cancer Neg Hx    Colon polyps Neg Hx    Heart disease Neg Hx     Social History Reviewed with no changes to be made today.   Outpatient Medications Prior to Visit  Medication Sig Dispense Refill   anastrozole (ARIMIDEX) 1 MG tablet Take 1 tablet (1 mg total) by mouth daily. 90 tablet 3   ibuprofen (ADVIL) 400 MG tablet Take 400 mg by mouth every 6 (six) hours as needed.     omeprazole (PRILOSEC) 20 MG capsule Take 1 capsule (20 mg total) by mouth daily. 90 capsule 1   cholecalciferol (VITAMIN D3) 25 MCG (1000 UNIT) tablet Take 1 tablet (1,000 Units total) by mouth daily. 90 tablet 1   losartan-hydrochlorothiazide (HYZAAR) 100-25 MG tablet Take 1 tablet by mouth daily. 90 tablet 1   omega-3 acid ethyl esters (LOVAZA) 1 g  capsule Take 1 capsule (1 g total) by mouth 2 (two) times daily. 180 capsule 1   rosuvastatin (CRESTOR) 20 MG tablet Take 1 tablet (20 mg total) by mouth daily. 90 tablet 3   traZODone (DESYREL) 50 MG tablet Take 0.5-1 tablets (25-50 mg total) by mouth at bedtime as needed for sleep. (Patient not taking: Reported on 01/08/2024) 30 tablet 3   Facility-Administered Medications Prior to Visit  Medication Dose Route Frequency Provider Last Rate Last Admin   bupivacaine liposome (EXPAREL) 1.3 % injection 266 mg  20 mL Infiltration Once Almond Lint, MD        No Known Allergies     Objective:    BP (!) 142/80 (BP Location: Left Arm, Patient Position: Sitting, Cuff Size: Normal)   Pulse 90   Resp 19   Ht 4\' 7"  (1.397 m)   Wt 151 lb 9.6 oz (68.8 kg)   SpO2 98%   BMI 35.24 kg/m  Wt Readings from Last 3 Encounters:  01/08/24 151 lb 9.6 oz (68.8 kg)  06/05/23 145 lb 14.4 oz (66.2 kg)  04/10/23 149 lb 14.6 oz (68 kg)    Physical Exam Vitals and nursing note reviewed.  Constitutional:      Appearance: She is well-developed.  HENT:     Head: Normocephalic and atraumatic.  Cardiovascular:     Rate and Rhythm: Normal rate and regular rhythm.     Heart  sounds: Normal heart sounds. No murmur heard.    No friction rub. No gallop.  Pulmonary:     Effort: Pulmonary effort is normal. No tachypnea or respiratory distress.     Breath sounds: Normal breath sounds. No decreased breath sounds, wheezing, rhonchi or rales.  Chest:     Chest wall: No tenderness.  Abdominal:     General: Bowel sounds are normal.     Palpations: Abdomen is soft.  Musculoskeletal:        General: Normal range of motion.     Cervical back: Normal range of motion.  Skin:    General: Skin is warm and dry.  Neurological:     Mental Status: She is alert and oriented to person, place, and time.     Coordination: Coordination normal.  Psychiatric:        Behavior: Behavior normal. Behavior is cooperative.        Thought Content: Thought content normal.        Judgment: Judgment normal.          Patient has been counseled extensively about nutrition and exercise as well as the importance of adherence with medications and regular follow-up. The patient was given clear instructions to go to ER or return to medical center if symptoms don't improve, worsen or new problems develop. The patient verbalized understanding.   Follow-up: No follow-ups on file.   Claiborne Rigg, FNP-BC [facility] [place], [place] [phone]   01/08/2024, 2:19 PM

## 2024-01-09 LAB — CMP14+EGFR
ALT: 16 IU/L (ref 0–32)
AST: 21 IU/L (ref 0–40)
Albumin: 4.4 g/dL (ref 3.8–4.8)
Alkaline Phosphatase: 171 IU/L — ABNORMAL HIGH (ref 44–121)
BUN/Creatinine Ratio: 38 — ABNORMAL HIGH (ref 12–28)
BUN: 24 mg/dL (ref 8–27)
Bilirubin Total: 0.2 mg/dL (ref 0.0–1.2)
CO2: 21 mmol/L (ref 20–29)
Calcium: 9.8 mg/dL (ref 8.7–10.3)
Chloride: 100 mmol/L (ref 96–106)
Creatinine, Ser: 0.63 mg/dL (ref 0.57–1.00)
Globulin, Total: 3.3 g/dL (ref 1.5–4.5)
Glucose: 98 mg/dL (ref 70–99)
Potassium: 4.6 mmol/L (ref 3.5–5.2)
Sodium: 137 mmol/L (ref 134–144)
Total Protein: 7.7 g/dL (ref 6.0–8.5)
eGFR: 94 mL/min/{1.73_m2} (ref >59)

## 2024-01-09 LAB — CBC WITH DIFFERENTIAL/PLATELET
Basophils Absolute: 0 10*3/uL (ref 0.0–0.2)
Basos: 1 %
EOS (ABSOLUTE): 0.2 10*3/uL (ref 0.0–0.4)
Eos: 3 %
Hematocrit: 37.4 % (ref 34.0–46.6)
Hemoglobin: 12.2 g/dL (ref 11.1–15.9)
Immature Grans (Abs): 0 10*3/uL (ref 0.0–0.1)
Immature Granulocytes: 1 %
Lymphocytes Absolute: 0.7 10*3/uL (ref 0.7–3.1)
Lymphs: 12 %
MCH: 25.9 pg — ABNORMAL LOW (ref 26.6–33.0)
MCHC: 32.6 g/dL (ref 31.5–35.7)
MCV: 79 fL (ref 79–97)
Monocytes Absolute: 0.7 10*3/uL (ref 0.1–0.9)
Monocytes: 11 %
Neutrophils Absolute: 4.5 10*3/uL (ref 1.4–7.0)
Neutrophils: 72 %
Platelets: 375 10*3/uL (ref 150–450)
RBC: 4.71 x10E6/uL (ref 3.77–5.28)
RDW: 13.8 % (ref 11.7–15.4)
WBC: 6.1 10*3/uL (ref 3.4–10.8)

## 2024-01-09 LAB — VITAMIN D 25 HYDROXY (VIT D DEFICIENCY, FRACTURES): Vit D, 25-Hydroxy: 44.7 ng/mL (ref 30.0–100.0)

## 2024-01-10 ENCOUNTER — Encounter: Payer: Self-pay | Admitting: Nurse Practitioner

## 2024-01-13 LAB — FECAL OCCULT BLOOD, IMMUNOCHEMICAL: Fecal Occult Bld: NEGATIVE

## 2024-01-15 ENCOUNTER — Encounter: Payer: Self-pay | Admitting: Nurse Practitioner

## 2024-01-17 ENCOUNTER — Other Ambulatory Visit: Payer: Self-pay

## 2024-04-04 ENCOUNTER — Other Ambulatory Visit: Payer: Self-pay | Admitting: Nurse Practitioner

## 2024-04-04 ENCOUNTER — Other Ambulatory Visit: Payer: Self-pay

## 2024-04-04 DIAGNOSIS — K219 Gastro-esophageal reflux disease without esophagitis: Secondary | ICD-10-CM

## 2024-04-05 ENCOUNTER — Other Ambulatory Visit: Payer: Self-pay

## 2024-04-05 MED ORDER — OMEPRAZOLE 20 MG PO CPDR
20.0000 mg | DELAYED_RELEASE_CAPSULE | Freq: Every day | ORAL | 1 refills | Status: AC
  Filled 2024-04-05 – 2024-10-29 (×3): qty 90, 90d supply, fill #0

## 2024-04-17 ENCOUNTER — Other Ambulatory Visit: Payer: Self-pay

## 2024-06-22 ENCOUNTER — Other Ambulatory Visit: Payer: Self-pay

## 2024-06-25 ENCOUNTER — Other Ambulatory Visit: Payer: Self-pay

## 2024-10-29 ENCOUNTER — Other Ambulatory Visit: Payer: Self-pay

## 2024-10-29 ENCOUNTER — Other Ambulatory Visit: Payer: Self-pay | Admitting: Nurse Practitioner

## 2024-10-29 DIAGNOSIS — I10 Essential (primary) hypertension: Secondary | ICD-10-CM

## 2024-10-29 MED ORDER — LOSARTAN POTASSIUM-HCTZ 100-25 MG PO TABS
1.0000 | ORAL_TABLET | Freq: Every day | ORAL | 0 refills | Status: AC
  Filled 2024-10-29: qty 90, 90d supply, fill #0

## 2024-11-08 ENCOUNTER — Other Ambulatory Visit: Payer: Self-pay
